# Patient Record
Sex: Male | Born: 1940 | Race: White | Hispanic: No | Smoking: Former smoker
Health system: Southern US, Community
[De-identification: ages and names within clinical notes are randomized; demographics above are authoritative.]

## PROBLEM LIST (undated history)

## (undated) DIAGNOSIS — F0390 Unspecified dementia without behavioral disturbance: Secondary | ICD-10-CM

## (undated) DIAGNOSIS — M109 Gout, unspecified: Secondary | ICD-10-CM

## (undated) DIAGNOSIS — I5032 Chronic diastolic (congestive) heart failure: Secondary | ICD-10-CM

## (undated) HISTORY — DX: Unspecified dementia, unspecified severity, without behavioral disturbance, psychotic disturbance, mood disturbance, and anxiety: F03.90

## (undated) HISTORY — PX: SKIN GRAFT: SHX250

---

## 2015-10-15 ENCOUNTER — Encounter (HOSPITAL_COMMUNITY): Payer: Self-pay | Admitting: Family Medicine

## 2015-10-15 ENCOUNTER — Emergency Department (HOSPITAL_COMMUNITY): Payer: Medicare Other

## 2015-10-15 ENCOUNTER — Emergency Department (HOSPITAL_COMMUNITY)
Admission: EM | Admit: 2015-10-15 | Discharge: 2015-10-15 | Disposition: A | Discharge status: Home / Self Care | Payer: Medicare Other | Attending: Physician Assistant | Admitting: Physician Assistant

## 2015-10-15 DIAGNOSIS — M25562 Pain in left knee: Secondary | ICD-10-CM | POA: Insufficient documentation

## 2015-10-15 DIAGNOSIS — Z87891 Personal history of nicotine dependence: Secondary | ICD-10-CM | POA: Diagnosis not present

## 2015-10-15 DIAGNOSIS — M25561 Pain in right knee: Secondary | ICD-10-CM | POA: Diagnosis not present

## 2015-10-15 DIAGNOSIS — M79672 Pain in left foot: Secondary | ICD-10-CM | POA: Insufficient documentation

## 2015-10-15 DIAGNOSIS — M25569 Pain in unspecified knee: Secondary | ICD-10-CM | POA: Diagnosis not present

## 2015-10-15 HISTORY — DX: Gout, unspecified: M10.9

## 2015-10-15 MED ORDER — MELOXICAM 7.5 MG PO TABS
7.5000 mg | ORAL_TABLET | Freq: Once | ORAL | Status: AC
  Administered 2015-10-15: 7.5 mg via ORAL
  Filled 2015-10-15: qty 1

## 2015-10-15 MED ORDER — HYDROCODONE-ACETAMINOPHEN 5-325 MG PO TABS: 1.0000 | ORAL_TABLET | ORAL | Status: DC | PRN

## 2015-10-15 MED ORDER — HYDROCODONE-ACETAMINOPHEN 5-325 MG PO TABS
1.0000 | ORAL_TABLET | Freq: Once | ORAL | Status: AC
Start: 2015-10-15 — End: 2015-10-15
  Administered 2015-10-15: 1 via ORAL
  Filled 2015-10-15: qty 1

## 2015-10-15 MED ORDER — MELOXICAM 7.5 MG PO TABS: 15.0000 mg | ORAL_TABLET | Freq: Every day | ORAL | Status: DC

## 2015-10-15 MED ORDER — HYDROCODONE-ACETAMINOPHEN 5-325 MG PO TABS: 1.0000 | ORAL_TABLET | Freq: Four times a day (QID) | ORAL | Status: DC | PRN

## 2015-10-15 NOTE — Progress Notes (Signed)
CSW met with patient at bedside. Patient states that he lives at home in [place] with his nephew. He informed CSW that he has only been walking to the bathroom and no where else since Friday.   Patient states that he completes his ADL's independently. He state that he is not interested in a facility but that he will accept information.  CSW provided the patient with facility information and information regarding medical alert bracelet.   CSW asked patient about day programs. However, the patient is not interested. CSW encouraged the patient to apply for medicaid through DSS.  Patient appears to have a good support system that consist of his sister.  Willette Brace [phone] ED CSW 10/15/2015 11:23 PM

## 2015-10-15 NOTE — Discharge Instructions (Signed)
Take the prescribed medication as directed.  Use caution when taking vicodin, it can make you sleepy/drowsy. Recommend to get a walker as recommended by social work. Follow-up with a primary care physician in the area-- see resource guide for this. Return to the ED for new or worsening symptoms.   Emergency Department Resource Guide 1) Find a Doctor and Pay Out of Pocket Although you won't have to find out who is covered by your insurance plan, it is a good idea to ask around and get recommendations. You will then need to call the office and see if the doctor you have chosen will accept you as a new patient and what types of options they offer for patients who are self-pay. Some doctors offer discounts or will set up payment plans for their patients who do not have insurance, but you will need to ask so you aren't surprised when you get to your appointment.  2) Contact Your Local Health Department Not all health departments have doctors that can see patients for sick visits, but many do, so it is worth a call to see if yours does. If you don't know where your local health department is, you can check in your phone book. The CDC also has a tool to help you locate your state's health department, and many state websites also have listings of all of their local health departments.  3) Find a Walk-in Clinic If your illness is not likely to be very severe or complicated, you may want to try a walk in clinic. These are popping up all over the country in pharmacies, drugstores, and shopping centers. They're usually staffed by nurse practitioners or physician assistants that have been trained to treat common illnesses and complaints. They're usually fairly quick and inexpensive. However, if you have serious medical issues or chronic medical problems, these are probably not your best option.  No Primary Care Doctor: - Call Health Connect at  [phone] - they can help you locate a primary care doctor that   accepts your insurance, provides certain services, etc. - Physician Referral Service- [phone]  Chronic Pain Problems: Organization         Address  Phone   Notes  [facility]  [phone]) [phone] Patients need to be referred by their primary care doctor.   Medication Assistance: Organization         Address  Phone   Notes  [facility] [address] [place], [place] [address] [phone] --Must be a resident of [place] [facility] -- Must have NO insurance coverage whatsoever (no Medicaid/ Medicare, etc.) -- The pt. MUST have a primary care doctor that directs their care regularly and follows them in the community   MedAssist  [phone]   [employer]  [phone]    Agencies that provide inexpensive medical care: Organization         Address  Phone   Notes  [facility]  [phone]) [phone]   [facility]    [phone]) [phone]   [facility] [address] [place], [place] [address] [phone]   [facility] [address], [place] [phone]   Planned Parenthood    [phone]) [phone]   [facility]    [phone](336) [phone]   [facility]  [address], [place] Phone:  [phone], Fax:  [phone] Hours of Operation:  9  am - 6 pm, M-F.  Also accepts Medicaid/Medicare and self-pay.  [facility] for [place] [address], [address], [place] Phone: [phone], Fax: [phone]. Hours of Operation:  8:30 am - 5:30 pm, M-F.  Also accepts Medicaid and self-pay.  [facility] [place] [address], [place] Phone: [phone]   [place], [place], [place] [phone], Ext. [phone] Mondays & Thursdays: 7-9 AM.  First 15 patients are seen on a first come, first serve basis.    [place] Providers:  Organization          Address  Phone   Notes  [facility] [address], [place] [phone] Also accepts self-pay patients.  [facility] [address], [place]  [phone]   [place], [address], [place] [phone]   [facility] [address], [place] [phone]   Lucianne Lei [address], [place]   [phone] Only accepts [place] Access [place] patients after they have their name applied to their card.   Self-Pay (no insurance) in [facility]:  Organization         Address  Phone   Notes  Sickle Cell Patients, [facility] [place] [phone]   [facility] [place] [phone]   [facility] [place]  [place], [place], [place] [phone]   [facility]/Dr. Osei-Bonsu  [address], [place] or [address], [place] [phone] Phone number for both [place] and [place] locations is the same.  [facility] [address], [place] [phone]   [facility] [address], [place] or [address] [phone]   [facility] [address], [place] [phone], phone; [phone], fax Sees patients 1st and 3rd Saturday of every month.  Must not qualify for public or private insurance (i.e. Medicaid, Medicare, [place] Health Choice, Veterans' Benefits)  Household income should be no more than 200% of the poverty level The clinic cannot treat you if you are pregnant or think you are pregnant  Sexually transmitted diseases are not treated at the clinic.    Dental Care: Organization         Address  Phone  Notes  [facility] [place] [phone] Accepts children up to age 52 who are enrolled in [place] or [place]; pregnant women with a Medicaid card; and children who have applied for Medicaid or [place] Health Choice, but were declined, whose parents can pay a reduced fee at time of service.  [facility] Department of [facility]  [address], [place] [phone] Accepts children up to age 78 who are enrolled in [place] or [place]; pregnant women with a Medicaid card; and children who have applied for Medicaid or  Health Choice, but were declined, whose parents can pay a reduced fee at time of service.  [facility] Adult [facility] Access PROGRAM  [place] [phone] Patients are seen by appointment only. Walk-ins are not accepted. [place] will see patients 18 years of  age and older. Monday - Tuesday (8am-5pm) Most Wednesdays (8:30-5pm) $30 per visit, cash only  [facility] Adult Dental Access PROGRAM  [address], [facility] [phone] Patients are seen by appointment only. Walk-ins are not accepted. [place] will see patients 57 years of age and older. One Wednesday Evening (Monthly: Volunteer Based).  $30 per visit, cash only  [place]  [phone] for adults; Children under age 78, call Graduate Pediatric Dentistry at [phone]. Children aged 43-14, please call [phone] to request a pediatric application.  Dental services are provided in all areas of dental care including fillings, crowns and bridges, complete and partial dentures, implants, gum treatment, root canals, and extractions. Preventive care is also provided. Treatment is provided to both adults and children. Patients are selected via a lottery and there is often a waiting list.   [facility] [address] [place]  [phone] [email or web address]   [facility] [address] [place], [place]  [phone], Ext. [phone] Second and Fourth Thursday of each month, opens at 6:30 AM; Clinic ends at 9 AM.  Patients are seen on a first-come first-served basis, and a limited number are seen during each clinic.   [facility]  [address] Hillard Danker [place], [place] [phone]   Eligibility Requirements You must have lived in [place], [place], or [place] counties for at least the last three months.   You cannot be eligible for state or federal sponsored [employer], including [employer], [place], or [employer].   You generally cannot be eligible for healthcare insurance through your employer.    How to apply: Eligibility screenings are held every Tuesday and Wednesday afternoon from 1:00 pm until 4:00 pm. You do not need an appointment for the interview!  [facility] [address] [place], [place]   [place]  [place] Department  [place]  [phone]    Behavioral Health Resources in the Community: Intensive Outpatient Programs Organization         Address  Phone  Notes  [place]. [address], [place], [place] [phone]   [facility] Outpatient [address], [place], [place]   ADS: Alcohol & Drug Svcs [address] [place], [place]   [place] [address],  [place], [place] or [phone]   Substance Abuse Resources Organization         Address  Phone  Notes  Alcohol and Drug Services  [phone]   [place]  [phone]   The [place]   Chinita Pester  [phone]   Residential & Outpatient Substance Abuse Program  [phone]   Psychological Services Organization         Address  Phone  Notes  [facility] [place]  [place]  [phone]    [facility] [address] [place] or [phone]    Mobile Crisis Teams Organization         Address  Phone  Notes  [facility], Mobile Crisis Care Unit  [phone]   [facility] Psychotherapeutic Services  [address] [place], [place]   [facility] [address] [place] [phone]    Self-Help/Support Groups Organization  Address  Phone             Notes  [place]. of [place] - variety of support groups  [place] Call for more information  Narcotics Anonymous (NA), Caring [facility] [address], [employer] [place]  2 meetings at this location   Special educational needs teacher         Address  Phone  Notes  [facility]idential Treatment [place],    [place]  [phone]   [facility]  [address] [place] [address]250, [place], [place]   [place], [place] [phone] Admissions: 8am-3pm M-F  Incentives Substance [place] [address],    [place], [place] [phone]   [facility] [address] [place], [place], [place]   [facility] [address]  [place], [place]   [facility]ight Programs - Intensive Outpatient [address], [place], [place]   [facility] ([place].,  [place], [place] [phone] or [phone]   [facility]idential Treatment Services (RTS) [address] [place], [place] Accepts Medicaid  [facility] [place] [address]  [place] [phone] Substance Abuse/Addiction Treatment   [facility] Organization         Address  Phone  Notes  [facility]  [phone]   Domenic Schwab, PhD [address] Arlis [place], [place]   [phone] or [phone]   [place]  [place], [place] [phone]   [facility] [address] [place], [place] [phone] Insurance/Medicaid/sponsorship through [facility] and Families [address] [place]                                    [place], [place] [phone] [place] [address], [place] [phone]    Dr. Adele Schilder  [phone]   [facility] Dept. 1) [address], [place] 2) [place] 3)  [place] [address], [place] [phone]  [phone]   [place] [phone] or [phone] (After Hours)

## 2015-10-15 NOTE — ED Notes (Signed)
Pt ambulating with stand by assistance.

## 2015-10-15 NOTE — ED Notes (Addendum)
Patient is from home [facility]. Patient is complaining of bilateral knee pain with minor swelling knees. Pt reported he was dancing on Friday. Pt denies any injury.

## 2015-10-15 NOTE — ED Provider Notes (Signed)
CSN: [id number]     Arrival date & time 10/15/15  1908 History   First MD Initiated Contact with Patient 10/15/15 2049     Chief Complaint  Patient presents with  . Knee Pain     (Consider location/radiation/quality/duration/timing/severity/associated sxs/prior Treatment) The history is provided by the patient and medical records.    74 y.o. M here with bilateral knee pain after dancing last week.  He states he was doing this for fun, denies injury, trauma, or falls.  Patient states it has been difficulty for him to walk around at home, he lives alone.  Patient also states some left foot pain.  He states he thinks he has a hx of gout, however sister reports this has never been formally diagnosed.  He states he thinks he took some "gout medicine" but not sure exactly what it was.  He also reports some left foot pain.  Patient states he has had trouble walking at home over the past few days due to pain so he has been urinating in a jug in his room.  He denies fever, chills, sweats.  No chest pain or SOB.  VSS.  Past Medical History  Diagnosis Date  . Gout    History reviewed. No pertinent past surgical history. History reviewed. No pertinent family history. Social History  Substance Use Topics  . Smoking status: Former Research scientist (life sciences)  . Smokeless tobacco: None  . Alcohol Use: No    Review of Systems  Musculoskeletal: Positive for arthralgias.  All other systems reviewed and are negative.     Allergies  Review of patient's allergies indicates no known allergies.  Home Medications   Prior to Admission medications   Not on File   BP 127/80 mmHg  Pulse 109  Temp(Src) 98.2 F (36.8 C) (Oral)  Resp 20  Ht _0  (1.753 m)  Wt 83.915 kg  BMI 27.31 kg/m2  SpO2 99%   Physical Exam  Constitutional: He is oriented to person, place, and time. He appears well-developed and well-nourished. No distress.  elderly  HENT:  Head: Normocephalic and atraumatic.  Mouth/Throat: Oropharynx is  clear and moist.  Eyes: Conjunctivae and EOM are normal. Pupils are equal, round, and reactive to light.  Neck: Normal range of motion. Neck supple.  Cardiovascular: Normal rate, regular rhythm and normal heart sounds.   Pulmonary/Chest: Effort normal and breath sounds normal. No respiratory distress. He has no wheezes.  Musculoskeletal: Normal range of motion.       Right knee: He exhibits no swelling. Tenderness found. Medial joint line tenderness noted.       Left knee: He exhibits no swelling. Tenderness found. Medial joint line tenderness noted.       Left foot: There is tenderness.  Tenderness of medial joint lines of bilateral knees as well as diffusely throughout left foot; no bony deformities; mild soft tissue swelling noted; no skin discoloration or open wounds; extremities are neurovascularly intact  Neurological: He is alert and oriented to person, place, and time.  Skin: Skin is warm and dry. He is not diaphoretic.  Psychiatric: He has a normal mood and affect.  Nursing note and vitals reviewed.   ED Course  Procedures (including critical care time) Labs Review Labs Reviewed - No data to display  Imaging Review Dg Knee Complete 4 Views Left  10/15/2015  CLINICAL DATA:  Left knee pain following consecutive days of dancing, initial encounter EXAM: LEFT KNEE - COMPLETE 4+ VIEW COMPARISON:  None FINDINGS: No acute fracture  or dislocation is noted. A large joint effusion is noted in the suprapatellar bursa. Some prepatellar skin thickening is noted as well. IMPRESSION: Soft tissue changes as described without acute bony abnormality. Electronically Signed   By: Inez Catalina M.D.   On: 10/15/2015 22:52   Dg Knee Complete 4 Views Right  10/15/2015  CLINICAL DATA:  Knee pain following dancing for 3 consecutive days, initial encounter EXAM: RIGHT KNEE - COMPLETE 4+ VIEW COMPARISON:  None. FINDINGS: No acute fracture or dislocation is noted. A small joint effusion is noted as well as  some thickening over the prepatellar region. Mild vascular calcifications are noted. IMPRESSION: Soft tissue changes without acute bony abnormality. Electronically Signed   By: Inez Catalina M.D.   On: 10/15/2015 22:49   Dg Foot Complete Left  10/15/2015  CLINICAL DATA:  74 year old male with gout presenting with foot pain. EXAM: LEFT FOOT - COMPLETE 3+ VIEW COMPARISON:  None. FINDINGS: No acute fracture or dislocation. There are erosive changes of the medial aspect of the first MTP joint compatible with known gout. The soft tissues are unremarkable. Spleen IMPRESSION: No acute fracture dislocation. Electronically Signed   By: Anner Crete M.D.   On: 10/15/2015 22:55   I have personally reviewed and evaluated these images and lab results as part of my medical decision-making.   EKG Interpretation None      MDM   Final diagnoses:  Bilateral knee pain  Left foot pain   74 year old male here with bilateral knee pain as well as left foot pain after dancing last week.  He has had limited ambulation since due to pain.  Patient does currently live alone, sister encouraged him to come here today. He has tenderness of medial joint lines of bilateral knees as well as diffusely throughout left foot. There are no overlying skin changes or signs concerning for septic joint. Initially, patient had difficulty ambulating, however he was able to ambulate with standby assistance after his pain was controlled with Mobic and Vicodin. Sister has concerns as patient lives alone.  Social work has met with patient and offered him multiple home health services and/or day programs but he declined all assistance. He does have a cane at home, sister is planning to get him a walker as well. He does not currently have a primary care physician, he was given resource guide in which to find one in the area. He was also encouraged to sign up for Medicaid.  Discussed plan with patient, he/she acknowledged understanding and  agreed with plan of care.  Return precautions given for new or worsening symptoms.  Larene Pickett, PA-C 10/16/15 0009  Courteney Julio Alm, MD 10/16/15 [phone]

## 2015-10-16 NOTE — Progress Notes (Signed)
[facility] by EDRN to speak to patient.  Patient presents to Ed with pan to left foot.  EDCM spoke to patient and his sister at bedside.  Patient lives at home with his nephew.  Patient's sister reports, "He does help out a little."  Patient reports he has never had home health before.  EDCM explained home health services to patient and his sister.  Patient eports he is able to complete his ADL's on his own.  Patient declines home health services at this time.  [facility] asked patient if he needed a walker?  Patient reports he has a cane at home.  He also reports he has his wife's walker at home.  Patient's sister is nit sure of this.  Patient declined walker at this time.  EDCM informed patient and his sister that they may purchase a walker at a good will store for discount price.  Patient's sister reports there are two good will stores around where she lives and she will check there for a walker.  EDCM provided patient with list of home health agencies [place].  EDCM explained to patient and his sister if he chooses to have home health services in the future, he may do so through his pcp.  Patient reports his pcp is Dr. Vear ClockPhillips in [place].  Patient and patient's sister thankful for resources.  No further EDCM needs at this time.

## 2015-12-04 ENCOUNTER — Encounter (HOSPITAL_COMMUNITY): Payer: Self-pay | Admitting: Emergency Medicine

## 2015-12-04 ENCOUNTER — Emergency Department (HOSPITAL_COMMUNITY)
Admission: EM | Admit: 2015-12-04 | Discharge: 2015-12-04 | Disposition: A | Discharge status: Home / Self Care | Payer: Medicare Other | Attending: Emergency Medicine | Admitting: Emergency Medicine

## 2015-12-04 ENCOUNTER — Emergency Department (HOSPITAL_COMMUNITY): Payer: Medicare Other

## 2015-12-04 DIAGNOSIS — Y9389 Activity, other specified: Secondary | ICD-10-CM | POA: Insufficient documentation

## 2015-12-04 DIAGNOSIS — Z791 Long term (current) use of non-steroidal anti-inflammatories (NSAID): Secondary | ICD-10-CM | POA: Diagnosis not present

## 2015-12-04 DIAGNOSIS — M25562 Pain in left knee: Secondary | ICD-10-CM | POA: Diagnosis not present

## 2015-12-04 DIAGNOSIS — Z87891 Personal history of nicotine dependence: Secondary | ICD-10-CM | POA: Diagnosis not present

## 2015-12-04 DIAGNOSIS — I10 Essential (primary) hypertension: Secondary | ICD-10-CM | POA: Insufficient documentation

## 2015-12-04 DIAGNOSIS — Y92410 Unspecified street and highway as the place of occurrence of the external cause: Secondary | ICD-10-CM | POA: Insufficient documentation

## 2015-12-04 DIAGNOSIS — Y998 Other external cause status: Secondary | ICD-10-CM | POA: Insufficient documentation

## 2015-12-04 DIAGNOSIS — Z8739 Personal history of other diseases of the musculoskeletal system and connective tissue: Secondary | ICD-10-CM | POA: Diagnosis not present

## 2015-12-04 DIAGNOSIS — S8992XA Unspecified injury of left lower leg, initial encounter: Secondary | ICD-10-CM | POA: Insufficient documentation

## 2015-12-04 MED ORDER — HYDROCODONE-ACETAMINOPHEN 5-325 MG PO TABS: 1.0000 | ORAL_TABLET | Freq: Four times a day (QID) | ORAL | Status: DC | PRN

## 2015-12-04 NOTE — Progress Notes (Signed)
ED CM consulted by Triage NP for assistance with orthopedic referral providers, Stating pt without insurance and no pcp CM spoke with pt and daughter at bedside prior to review of EPIC notes on pt because the male at the bedside was anxious to be seen  Cm provided 75 yr old male with medicare.gov contact information & toll free number 1-800-MEDICARE (1-709-875-5507) The male at the bedside states pt just never got time to apply for medicare.  Cm encouraged pt to apply for medicare and discussed uninsured discounted pcps in [place] Discussed EDPs are not pcp and some specialists do not take referral from EDP only pcps.  Discussed that specialists may not offered discounted fees to be seen Discussed $200-250 fees  Discussed medicare enrollment time frames and possible penalty for late enrollment  Provided DSS & SSI contact information  CM discussed and provided written information for uninsured accepting pcps, discussed the importance of pcp vs EDP services for f/u care, www.needymeds.org, www.goodrx.com, discounted pharmacies and other [employer] such as [facility] , [facility], affordable care act, financial assistance, uninsured dental services, [place] med assist, DSS and  health department  Reviewed resources for [employer] uninsured accepting pcps like [facility], family medicine at [address] [facility], [facility], [facility], local urgent care centers, [facility], [facility] family practice, general medical clinics, family services of the [place], [facility] urgent care plus others, medication resources, [facility] out patient pharmacies and housing Pt voiced understanding and appreciation of resources provided   Provided [facility] contact information Male at bedside states she wanted to get pt medicare and not use [facility] services  Male at bedside took resources from [employer] and informed Cm "he won't remember what you said as soon as you walk out the door"  But pt had told Cm he  had been seeing a dr in [place]   ED PA Updated on resources provided and recommendation to get medicare and to get the dr he has seen before to offer referral for orthopedic provider  When Cm began documenting on the pt Cm noted pt confirmed by ED registration to have medicare coverage   Plus pt has already been seen by [facility] ED PM CM on 10/15/15 when he hurt his foot, was offered home health but refused, told how to get a walker, cane from local [employer] Pt confirmed with this CM he has a cane and has been seen by Dr in [place] Male at bedside in constant motion and present to be nervous Male states pt see dr in [place] for his blood pressure when Cm asked why didn't the pt ask this pcp for a referral to an orthopedic provider, male stated "we never thought about it"  Cm referred pt and male to the dr pt sees for his bp Cm later noted when pt seen on 10/15/15 he states the doctor was Dr Vear Clock in [place]

## 2015-12-04 NOTE — Discharge Instructions (Signed)
Knee Pain Knee pain is a very common symptom and can have many causes. Knee pain often goes away when you follow your health care provider's instructions for relieving pain and discomfort at home. However, knee pain can develop into a condition that needs treatment. Some conditions may include:  Arthritis caused by wear and tear (osteoarthritis).  Arthritis caused by swelling and irritation (rheumatoid arthritis or gout).  A cyst or growth in your knee.  An infection in your knee joint.  An injury that will not heal.  Damage, swelling, or irritation of the tissues that support your knee (torn ligaments or tendinitis). If your knee pain continues, additional tests may be ordered to diagnose your condition. Tests may include X-rays or other imaging studies of your knee. You may also need to have fluid removed from your knee. Treatment for ongoing knee pain depends on the cause, but treatment may include:  Medicines to relieve pain or swelling.  Steroid injections in your knee.  Physical therapy.  Surgery. HOME CARE INSTRUCTIONS  Take medicines only as directed by your health care provider.  Rest your knee and keep it raised (elevated) while you are resting.  Do not do things that cause or worsen pain.  Avoid high-impact activities or exercises, such as running, jumping rope, or doing jumping jacks.  Apply ice to the knee area:  Put ice in a plastic bag.  Place a towel between your skin and the bag.  Leave the ice on for 20 minutes, 2-3 times a day.  Ask your health care provider if you should wear an elastic knee support.  Keep a pillow under your knee when you sleep.  Lose weight if you are overweight. Extra weight can put pressure on your knee.  Do not use any tobacco products, including cigarettes, chewing tobacco, or electronic cigarettes. If you need help quitting, ask your health care provider. Smoking may slow the healing of any bone and joint problems that you may  have. SEEK MEDICAL CARE IF:  Your knee pain continues, changes, or gets worse.  You have a fever along with knee pain.  Your knee buckles or locks up.  Your knee becomes more swollen. SEEK IMMEDIATE MEDICAL CARE IF:   Your knee joint feels hot to the touch.  You have chest pain or trouble breathing.   This information is not intended to replace advice given to you by your health care provider. Make sure you discuss any questions you have with your health care provider.   Document Released: 08/02/2007 Document Revised: 10/26/2014 Document Reviewed: 05/21/2014 Elsevier Interactive Patient Education 2016 Elsevier Inc.  Managing Your High Blood Pressure Blood pressure is a measurement of how forceful your blood is pressing against the walls of the arteries. Arteries are muscular tubes within the circulatory system. Blood pressure does not stay the same. Blood pressure rises when you are active, excited, or nervous; and it lowers during sleep and relaxation. If the numbers measuring your blood pressure stay above normal most of the time, you are at risk for health problems. High blood pressure (hypertension) is a long-term (chronic) condition in which blood pressure is elevated. A blood pressure reading is recorded as two numbers, such as 120 over 80 (or 120/80). The first, higher number is called the systolic pressure. It is a measure of the pressure in your arteries as the heart beats. The second, lower number is called the diastolic pressure. It is a measure of the pressure in your arteries as the heart  relaxes between beats.  Keeping your blood pressure in a normal range is important to your overall health and prevention of health problems, such as heart disease and stroke. When your blood pressure is uncontrolled, your heart has to work harder than normal. High blood pressure is a very common condition in adults because blood pressure tends to rise with age. Men and women are equally likely  to have hypertension but at different times in life. Before age 74, men are more likely to have hypertension. After 76 years of age, women are more likely to have it. Hypertension is especially common in African Americans. This condition often has no signs or symptoms. The cause of the condition is usually not known. Your caregiver can help you come up with a plan to keep your blood pressure in a normal, healthy range. BLOOD PRESSURE STAGES Blood pressure is classified into four stages: normal, prehypertension, stage 1, and stage 2. Your blood pressure reading will be used to determine what type of treatment, if any, is necessary. Appropriate treatment options are tied to these four stages:  Normal  Systolic pressure (mm Hg): below 120.  Diastolic pressure (mm Hg): below 80. Prehypertension  Systolic pressure (mm Hg): 120 to 139.  Diastolic pressure (mm Hg): 80 to 89. Stage1  Systolic pressure (mm Hg): 140 to 159.  Diastolic pressure (mm Hg): 90 to 99. Stage2  Systolic pressure (mm Hg): 160 or above.  Diastolic pressure (mm Hg): 100 or above. RISKS RELATED TO HIGH BLOOD PRESSURE Managing your blood pressure is an important responsibility. Uncontrolled high blood pressure can lead to:  A heart attack.  A stroke.  A weakened blood vessel (aneurysm).  Heart failure.  Kidney damage.  Eye damage.  Metabolic syndrome.  Memory and concentration problems. HOW TO MANAGE YOUR BLOOD PRESSURE Blood pressure can be managed effectively with lifestyle changes and medicines (if needed). Your caregiver will help you come up with a plan to bring your blood pressure within a normal range. Your plan should include the following: Education  Read all information provided by your caregivers about how to control blood pressure.  Educate yourself on the latest guidelines and treatment recommendations. New research is always being done to further define the risks and treatments for high blood  pressure. Lifestylechanges  Control your weight.  Avoid smoking.  Stay physically active.  Reduce the amount of salt in your diet.  Reduce stress.  Control any chronic conditions, such as high cholesterol or diabetes.  Reduce your alcohol intake. Medicines  Several medicines (antihypertensive medicines) are available, if needed, to bring blood pressure within a normal range. Communication  Review all the medicines you take with your caregiver because there may be side effects or interactions.  Talk with your caregiver about your diet, exercise habits, and other lifestyle factors that may be contributing to high blood pressure.  See your caregiver regularly. Your caregiver can help you create and adjust your plan for managing high blood pressure. RECOMMENDATIONS FOR TREATMENT AND FOLLOW-UP  The following recommendations are based on current guidelines for managing high blood pressure in nonpregnant adults. Use these recommendations to identify the proper follow-up period or treatment option based on your blood pressure reading. You can discuss these options with your caregiver.  Systolic pressure of 120 to 139 or diastolic pressure of 80 to 89: Follow up with your caregiver as directed.  Systolic pressure of 140 to 160 or diastolic pressure of 90 to 100: Follow up with your caregiver within 2  months.  Systolic pressure above 160 or diastolic pressure above 100: Follow up with your caregiver within 1 month.  Systolic pressure above 180 or diastolic pressure above 110: Consider antihypertensive therapy; follow up with your caregiver within 1 week.  Systolic pressure above 200 or diastolic pressure above 120: Begin antihypertensive therapy; follow up with your caregiver within 1 week.   This information is not intended to replace advice given to you by your health care provider. Make sure you discuss any questions you have with your health care provider.   Document Released:  06/29/2012 Document Reviewed: 06/29/2012 Elsevier Interactive Patient Education 2016 Elsevier Inc.  DASH Eating Plan DASH stands for "Dietary Approaches to Stop Hypertension." The DASH eating plan is a healthy eating plan that has been shown to reduce high blood pressure (hypertension). Additional health benefits may include reducing the risk of type 2 diabetes mellitus, heart disease, and stroke. The DASH eating plan may also help with weight loss. WHAT DO I NEED TO KNOW ABOUT THE DASH EATING PLAN? For the DASH eating plan, you will follow these general guidelines:  Choose foods with a percent daily value for sodium of less than 5% (as listed on the food label).  Use salt-free seasonings or herbs instead of table salt or sea salt.  Check with your health care provider or pharmacist before using salt substitutes.  Eat lower-sodium products, often labeled as "lower sodium" or "no salt added."  Eat fresh foods.  Eat more vegetables, fruits, and low-fat dairy products.  Choose whole grains. Look for the word "whole" as the first word in the ingredient list.  Choose fish and skinless chicken or [place] more often than red meat. Limit fish, poultry, and meat to 6 oz (170 g) each day.  Limit sweets, desserts, sugars, and sugary drinks.  Choose heart-healthy fats.  Limit cheese to 1 oz (28 g) per day.  Eat more home-cooked food and less restaurant, buffet, and fast food.  Limit fried foods.  Cook foods using methods other than frying.  Limit canned vegetables. If you do use them, rinse them well to decrease the sodium.  When eating at a restaurant, ask that your food be prepared with less salt, or no salt if possible. WHAT FOODS CAN I EAT? Seek help from a dietitian for individual calorie needs. Grains Whole grain or whole wheat bread. Brown rice. Whole grain or whole wheat pasta. Quinoa, bulgur, and whole grain cereals. Low-sodium cereals. Corn or whole wheat flour tortillas.  Whole grain cornbread. Whole grain crackers. Low-sodium crackers. Vegetables Fresh or frozen vegetables (raw, steamed, roasted, or grilled). Low-sodium or reduced-sodium tomato and vegetable juices. Low-sodium or reduced-sodium tomato sauce and paste. Low-sodium or reduced-sodium canned vegetables.  Fruits All fresh, canned (in natural juice), or frozen fruits. Meat and Other Protein Products Ground beef (85% or leaner), grass-fed beef, or beef trimmed of fat. Skinless chicken or [place]. Ground chicken or [place]. Pork trimmed of fat. All fish and seafood. Eggs. Dried beans, peas, or lentils. Unsalted nuts and seeds. Unsalted canned beans. Dairy Low-fat dairy products, such as skim or 1% milk, 2% or reduced-fat cheeses, low-fat ricotta or cottage cheese, or plain low-fat yogurt. Low-sodium or reduced-sodium cheeses. Fats and Oils Tub margarines without trans fats. Light or reduced-fat mayonnaise and salad dressings (reduced sodium). Avocado. Safflower, olive, or canola oils. Natural peanut or almond butter. Other Unsalted popcorn and pretzels. The items listed above may not be a complete list of recommended foods or beverages. Contact your dietitian  for more options. WHAT FOODS ARE NOT RECOMMENDED? Grains White bread. White pasta. White rice. Refined cornbread. Bagels and croissants. Crackers that contain trans fat. Vegetables Creamed or fried vegetables. Vegetables in a cheese sauce. Regular canned vegetables. Regular canned tomato sauce and paste. Regular tomato and vegetable juices. Fruits Dried fruits. Canned fruit in light or heavy syrup. Fruit juice. Meat and Other Protein Products Fatty cuts of meat. Ribs, chicken wings, bacon, sausage, bologna, salami, chitterlings, fatback, hot dogs, bratwurst, and packaged luncheon meats. Salted nuts and seeds. Canned beans with salt. Dairy Whole or 2% milk, cream, half-and-half, and cream cheese. Whole-fat or sweetened yogurt. Full-fat cheeses or  blue cheese. Nondairy creamers and whipped toppings. Processed cheese, cheese spreads, or cheese curds. Condiments Onion and garlic salt, seasoned salt, table salt, and sea salt. Canned and packaged gravies. Worcestershire sauce. Tartar sauce. Barbecue sauce. Teriyaki sauce. Soy sauce, including reduced sodium. Steak sauce. Fish sauce. Oyster sauce. Cocktail sauce. Horseradish. Ketchup and mustard. Meat flavorings and tenderizers. Bouillon cubes. Hot sauce. Tabasco sauce. Marinades. Taco seasonings. Relishes. Fats and Oils Butter, stick margarine, lard, shortening, ghee, and bacon fat. Coconut, palm kernel, or palm oils. Regular salad dressings. Other Pickles and olives. Salted popcorn and pretzels. The items listed above may not be a complete list of foods and beverages to avoid. Contact your dietitian for more information. WHERE CAN I FIND MORE INFORMATION? National Heart, Lung, and Blood Institute: [employer]   This information is not intended to replace advice given to you by your health care provider. Make sure you discuss any questions you have with your health care provider.   Document Released: 09/24/2011 Document Revised: 10/26/2014 Document Reviewed: 08/09/2013 Elsevier Interactive Patient Education [employer]
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
## 2016-02-02 ENCOUNTER — Emergency Department (HOSPITAL_COMMUNITY): Payer: Medicare Other

## 2016-02-02 ENCOUNTER — Observation Stay (HOSPITAL_COMMUNITY)
Admission: EM | Admit: 2016-02-02 | Discharge: 2016-02-04 | Disposition: A | Discharge status: Home / Self Care | Payer: Medicare Other | Attending: Internal Medicine | Admitting: Internal Medicine

## 2016-02-02 ENCOUNTER — Inpatient Hospital Stay (HOSPITAL_COMMUNITY): Payer: Medicare Other

## 2016-02-02 ENCOUNTER — Encounter (HOSPITAL_COMMUNITY): Payer: Self-pay | Admitting: Emergency Medicine

## 2016-02-02 DIAGNOSIS — R739 Hyperglycemia, unspecified: Secondary | ICD-10-CM | POA: Insufficient documentation

## 2016-02-02 DIAGNOSIS — R262 Difficulty in walking, not elsewhere classified: Secondary | ICD-10-CM | POA: Diagnosis not present

## 2016-02-02 DIAGNOSIS — I951 Orthostatic hypotension: Secondary | ICD-10-CM | POA: Diagnosis not present

## 2016-02-02 DIAGNOSIS — D72829 Elevated white blood cell count, unspecified: Secondary | ICD-10-CM | POA: Insufficient documentation

## 2016-02-02 DIAGNOSIS — N19 Unspecified kidney failure: Secondary | ICD-10-CM | POA: Insufficient documentation

## 2016-02-02 DIAGNOSIS — F0390 Unspecified dementia without behavioral disturbance: Secondary | ICD-10-CM | POA: Insufficient documentation

## 2016-02-02 DIAGNOSIS — Z23 Encounter for immunization: Secondary | ICD-10-CM | POA: Insufficient documentation

## 2016-02-02 DIAGNOSIS — R059 Cough, unspecified: Secondary | ICD-10-CM

## 2016-02-02 DIAGNOSIS — R05 Cough: Secondary | ICD-10-CM | POA: Insufficient documentation

## 2016-02-02 DIAGNOSIS — R404 Transient alteration of awareness: Secondary | ICD-10-CM | POA: Diagnosis not present

## 2016-02-02 DIAGNOSIS — R531 Weakness: Secondary | ICD-10-CM | POA: Diagnosis not present

## 2016-02-02 DIAGNOSIS — R55 Syncope and collapse: Secondary | ICD-10-CM | POA: Diagnosis not present

## 2016-02-02 DIAGNOSIS — Z87891 Personal history of nicotine dependence: Secondary | ICD-10-CM | POA: Diagnosis not present

## 2016-02-02 DIAGNOSIS — M109 Gout, unspecified: Secondary | ICD-10-CM | POA: Diagnosis not present

## 2016-02-02 LAB — CBC
HCT: 38.3 % — ABNORMAL LOW (ref 39.0–52.0)
HCT: 37.9 % — ABNORMAL LOW (ref 39.0–52.0)
HEMOGLOBIN: 12.9 g/dL — AB (ref 13.0–17.0)
HEMOGLOBIN: 12.9 g/dL — AB (ref 13.0–17.0)
MCH: 32.3 pg (ref 26.0–34.0)
MCH: 32.4 pg (ref 26.0–34.0)
MCHC: 33.7 g/dL (ref 30.0–36.0)
MCHC: 34 g/dL (ref 30.0–36.0)
MCV: 95.8 fL (ref 78.0–100.0)
MCV: 95.2 fL (ref 78.0–100.0)
PLATELETS: 188 10*3/uL (ref 150–400)
Platelets: 218 10*3/uL (ref 150–400)
RBC: 4 MIL/uL — AB (ref 4.22–5.81)
RBC: 3.98 MIL/uL — ABNORMAL LOW (ref 4.22–5.81)
RDW: 13.7 % (ref 11.5–15.5)
RDW: 13.7 % (ref 11.5–15.5)
WBC: 18.6 10*3/uL — ABNORMAL HIGH (ref 4.0–10.5)
WBC: 20.5 10*3/uL — AB (ref 4.0–10.5)

## 2016-02-02 LAB — DIFFERENTIAL
BASOS ABS: 0 10*3/uL (ref 0.0–0.1)
BASOS PCT: 0 %
Eosinophils Absolute: 0.1 10*3/uL (ref 0.0–0.7)
Eosinophils Relative: 0 %
LYMPHS PCT: 12 %
Lymphs Abs: 2.5 10*3/uL (ref 0.7–4.0)
MONOS PCT: 7 %
Monocytes Absolute: 1.4 10*3/uL — ABNORMAL HIGH (ref 0.1–1.0)
NEUTROS PCT: 81 %
Neutro Abs: 16.5 10*3/uL — ABNORMAL HIGH (ref 1.7–7.7)

## 2016-02-02 LAB — BASIC METABOLIC PANEL
Anion gap: 9 (ref 5–15)
BUN: 23 mg/dL — ABNORMAL HIGH (ref 6–20)
CALCIUM: 8.3 mg/dL — AB (ref 8.9–10.3)
CO2: 24 mmol/L (ref 22–32)
Chloride: 108 mmol/L (ref 101–111)
Creatinine, Ser: 1.26 mg/dL — ABNORMAL HIGH (ref 0.61–1.24)
GFR calc Af Amer: >60 mL/min (ref >60)
GFR, EST NON AFRICAN AMERICAN: 54 mL/min — AB (ref >60)
Glucose, Bld: 131 mg/dL — ABNORMAL HIGH (ref 65–99)
Potassium: 3.9 mmol/L (ref 3.5–5.1)
Sodium: 141 mmol/L (ref 135–145)

## 2016-02-02 LAB — URINALYSIS, ROUTINE W REFLEX MICROSCOPIC
Glucose, UA: NEGATIVE mg/dL
Hgb urine dipstick: NEGATIVE
Ketones, ur: 15 mg/dL — AB
Leukocytes, UA: NEGATIVE
NITRITE: NEGATIVE
PROTEIN: 30 mg/dL — AB
SPECIFIC GRAVITY, URINE: 1.039 — AB (ref 1.005–1.030)
pH: 5.5 (ref 5.0–8.0)

## 2016-02-02 LAB — URINE MICROSCOPIC-ADD ON

## 2016-02-02 LAB — I-STAT TROPONIN, ED: TROPONIN I, POC: 0 ng/mL (ref 0.00–0.08)

## 2016-02-02 LAB — D-DIMER, QUANTITATIVE: D-Dimer, Quant: 0.54 ug/mL-FEU — ABNORMAL HIGH (ref 0.00–0.50)

## 2016-02-02 MED ORDER — DEXTROMETHORPHAN POLISTIREX ER 30 MG/5ML PO SUER
15.0000 mg | Freq: Two times a day (BID) | ORAL | Status: DC | PRN
  Administered 2016-02-02 – 2016-02-03 (×2): 15 mg via ORAL
  Filled 2016-02-02 (×4): qty 5

## 2016-02-02 MED ORDER — ENOXAPARIN SODIUM 40 MG/0.4ML ~~LOC~~ SOLN
40.0000 mg | SUBCUTANEOUS | Status: DC
  Administered 2016-02-02 – 2016-02-03 (×2): 40 mg via SUBCUTANEOUS
  Filled 2016-02-02 (×2): qty 0.4

## 2016-02-02 MED ORDER — SODIUM CHLORIDE 0.9% FLUSH
3.0000 mL | Freq: Two times a day (BID) | INTRAVENOUS | Status: DC
  Administered 2016-02-02 – 2016-02-04 (×4): 3 mL via INTRAVENOUS

## 2016-02-02 MED ORDER — IOPAMIDOL (ISOVUE-370) INJECTION 76%
INTRAVENOUS | Status: AC
  Administered 2016-02-02: 70 mL
  Filled 2016-02-02: qty 100

## 2016-02-02 MED ORDER — PNEUMOCOCCAL VAC POLYVALENT 25 MCG/0.5ML IJ INJ: 0.5000 mL | INJECTION | INTRAMUSCULAR | Status: DC

## 2016-02-02 MED ORDER — SODIUM CHLORIDE 0.9 % IV SOLN
INTRAVENOUS | Status: DC
  Administered 2016-02-02: 23:00:00 via INTRAVENOUS

## 2016-02-02 NOTE — Progress Notes (Signed)
Pt D-dimer 0.54 Temp 100.4  Orthostatic VS Lying  134/76, HR 89 Sitting  135/63, HR 94 Standing  112/68, HR 108 Standing after 3 minutes  125/66, HR 107   MD on call paged to notify.

## 2016-02-02 NOTE — H&P (Signed)
Date: 02/02/2016               Patient Name:  Jacob Barajas MRN: [id number]  DOB: 1941/09/06 Age / Sex: 75 y.o., male   PCP: Marcine Matarharles W Phillips Jr., MD              Medical Service: Internal Medicine Teaching Service              Attending Physician: Dr. Burns SpainElizabeth A Butcher, MD    First Contact: Smitty KnudsenSarah Taino Maertens, MS 4 Pager: [phone]  Second Contact: Dr. Beckie Saltsivet Pager: [phone]            After Hours (After 5p/  First Contact Pager: [phone]  weekends / holidays): Second Contact Pager: [phone]   Chief Complaint: fell out at church  History of Present Illness: [name] is a 75 y.o. Man with a history of gout, who presents to the ED after an episode of syncope at church this morning.  He is accommpained by his friend/adopted daughter, with whom he has been living for the past 2 months, and who provides some of the history. According to the daughter, [name] has been slowly "going downhill lately" including having a cough that occurs only at night and is sometimes productive of small amounts of white mucous for the past "couple of months", but otherwise had been in his normal state of health prior to leaving for church this morning.  He had been complaining of being sleepy, but this was attributed to some OTC cough syrup taken the previous night that had alcohol in it that made him very sleepy.  The daughter could not recall what other medication contained in the cough syrup, but just that it made [name] very sleepy.  He also took some this morning before heading to church.  [name] does not remember much about falling out at church, other than just feeling very sleepy, like he "just fell asleep" and then he "woke up and he was on the ground".  He nor his daughter could say how long he was out before awakening.  He denies any lightheadedness, aura, vision/auditory changes, palpitations, or weakness that he experienced before he fell out.  He said he just woke up and realized he had fallen  asleep.  He denies that anyone saw him convulse while he was down.  He denies urinary or fecal incontinence at the time of the event.  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  He denies hypnogogic or hypnopompic hallucinations or paralysis and denies dreaming while he is asleep.  He denies chest pain, shortness of breath, weight loss, headaches, weakness, numbness/tingling, trouble swallowing, hematochezia, hematuria, hemoptysis, fevers/chills, or changes in bowel/bladder habits.  He reports that he can walk to and from the mailbox and up and down stairs without shortness of breath or chest pain.    In the ED a CXR was significant for bibasilar atelectasis vs. Infiltrates. An ECG was significant for partial LBBB and normal sinus rhythm.  A UA showed high specific gravity, and increased protein.     Meds: No current facility-administered medications for this encounter.   No current outpatient prescriptions on file.    Allergies: Allergies as of 02/02/2016  . (No Known Allergies)   Past Medical History  Diagnosis Date  . Gout    History reviewed. No pertinent past surgical history. Family History  Problem Relation  Age of Onset  . Heart disease Brother   . Cancer Sister     "throat" cancer   Social History   Social History  . Marital Status: Widowed    Spouse Name: N/A  . Number of Children: N/A  . Years of Education: N/A   Occupational History  . Not on file.   Social History Main Topics  . Smoking status: Former Games developer  . Smokeless tobacco: Not on file  . Alcohol Use: No  . Drug Use: Not on file  . Sexual Activity: No   Other Topics Concern  . Not on file   Social History Narrative    Review of Systems: Pertinent items noted in HPI and remainder of comprehensive ROS otherwise negative.  Physical Exam: Blood pressure 141/78, pulse 78, temperature 100.7 F (38.2 C),  temperature source Rectal, resp. rate 23, height  (1.676 m), weight 85.276 kg (188 lb), SpO2 100 %. General appearance: alert, cooperative and slowed mentation Head: Normocephalic, without obvious abnormality, atraumatic Eyes: conjunctivae/corneas clear. PERRL, EOM's intact. Fundi benign. Throat: lips, mucosa, and tongue normal; teeth and gums normal Neck: no adenopathy, no JVD, supple, symmetrical, trachea midline and thyroid not enlarged, symmetric, no tenderness/mass/nodules Lungs: faint inspiratory crackles in basilar lungs bilaterally Heart: regular rate and rhythm and S1, S2 normal Abdomen: soft, non-tender; bowel sounds normal; no masses,  no organomegaly Extremities: extremities normal, atraumatic, no cyanosis or edema Pulses: 2+ and symmetric Neurologic: Mental status: alertness: alert, orientation: time, date, person, place, president, can recall only 1/3 items Cranial nerves: normal  Lab results: Basic Metabolic Panel:  Recent Labs  46/96/29 1354  NA 141  K 3.9  CL 108  CO2 24  GLUCOSE 131*  BUN 23*  CREATININE 1.26*  CALCIUM 8.3*   CBC:  Recent Labs  02/02/16 1354  WBC 18.6*  HGB 12.9*  HCT 38.3*  MCV 95.8  PLT 188     Imaging results:  Dg Chest Portable 1 View  02/02/2016  CLINICAL DATA:  75 year old male with history of syncope while it church this morning. EXAM: PORTABLE CHEST 1 VIEW COMPARISON:  Chest x-ray 03/08/2011. FINDINGS: Lung volumes are low. Ill-defined bibasilar opacities are presumably areas of atelectasis. No consolidative airspace disease. No pleural effusions. No pneumothorax. No pulmonary nodule or mass noted. Pulmonary vasculature and the cardiomediastinal silhouette are within normal limits. IMPRESSION: 1. Low lung volumes with probable bibasilar subsegmental atelectasis. Electronically Signed   By: Trudie Reed M.D.   On: 02/02/2016 15:15    Other results: EKG: Sinus rhythm Incomplete left bundle branch block Probable left  ventricular hypertrophy Anterior Q waves, possibly due to LVH  Assessment & Plan by Problem: Active Problems:   Syncope  Mr. Kniskern is a 75 y.o. Man with no known significant past medical history who presented to the ED with 1 episode of syncope, likely cognitive impairment, concentrated urine, low-grade fever, and leukocytosis in the setting of prior use of OTC cough syrup suggesting orthostasis secondary to infection or dehydration vs. medication side effect.    Syncope: Patient with witnessed syncopal episode after taking medication that likely contained alcohol and diphenhydramine.  Laboratory and imaging findings are significant leukocytosis, low-grade fever, and some faint atelectasis/infilatrates on CXR.  He also had concentrated urine on UA.  The differential for syncope in an elderly man is broad, and given the history and physical exam and laboratory findings in this patient the most likely causes are medication side effect, orthostasis due to decreased intravascular volume as  evidenced by increased creatinine and high urine specific gravity, or early infection.  Can't miss findings include arrhythmia or other cardiac pathology.  While is ECG did show partial LBBB, and some changes that could be attributed to ischemia or LV hypertrophy, his history argues against a cardiac cause.  There is an absence of focal neurologic deficits on exam, however he does exhibit some cognitive deficits as evidenced by him only recalling 1/3 objects.  We will continue the work-up to exclude more worrisome causes, but the most likely causes are medication side effect and orthostasis.  --Check orthostatic vital signs --Hold over the counter cough medication --Consult PT, OT to assess strength/functional status --Hold off on antibiotics now, but continue to reassess if fever persists or physical exam findings show clear      signs of infectious source.  --Continuous telemetry  Cognitive Impairment: Patient  with slow mentation that is able to recall only 1/3 objects.   --Check cognition with MoCA tomorrow.  --Will need neurology follow-up if signs of dementia  Elevated Creatinine: Creatinine is 1.26 but baseline is unknown.  Could indicate pre-renal AKI owing to decreased intravascular volume.  Urine was highly concentrated which would be consistent with decreased volume/dehydration.  --Encourage PO fluids --Recheck BMP in the morning  Fever/Leukocytosis: Temp of 100.7 in ED.  WBC 18K, but no obvious source of infection other than 1 month history of productive cough.  BP and O2 sat are stable at this time.  Unlikely that viral respiratory infection would cause this level of leukocytosis.  Could be due to myelodysplastic disorder in an elderly patient. Also the patient has history of gout and complained of knee pain that he attributed to gout. Unlikely to be due to septic joint at this time as the patient is not complaining of severe pain that limits mobility. Fever could also be attributed to anticholinergic use.   --Check WBC differential --No indication for antibiotics at this time --Reassess knee pain after admitted to hospital --Check CBC in AM  Hyperglycemia: Blood glucose 131 on admission.  No recorded history of DM.  Will check A1C to assess long-term glucose control --Check A1C  Diet: --Regular diet  DVT PPX: LMWH 40 mg, subQ daily  Dispo:  Patient will require admission for 1-2 days.     This is a Psychologist, occupational Note.  The care of the patient was discussed with Dr. Beckie Salts and the assessment and plan was formulated with their assistance.  Please see their note for official documentation of the patient encounter.   Signed: Dayton Scrape Sheyanne Munley, Med Student 02/02/2016, 4:32 PM

## 2016-02-02 NOTE — ED Notes (Signed)
Pt arrives via [facility], ems reports patient was standing in church, pts friends report he slumped over the pugh in front of him, pt was helped back into sitting position on his pugh and came to. pt a/o x4, skin warm and dry, VSS, nad.

## 2016-02-02 NOTE — H&P (Signed)
Date: 02/02/2016               Patient Name:  Jacob Barajas MRN: [id number]  DOB: [birth year] Age / Sex: 75 y.o., male   PCP: Jacob Matarharles W Phillips Jr., MD         Medical Service: Internal Medicine Teaching Service         Attending Physician: Dr. Burns SpainElizabeth A Butcher, MD    First Contact: Jacob Barajas Pager: [phone]  Second Contact: Dr. Beckie Barajas Pager: [phone]       After Hours (After 5p/  First Contact Pager: [phone]  weekends / holidays): Second Contact Pager: [phone]   Chief Complaint: Syncope  History of Present Illness: Mr. Jacob Barajas is a 75yo man with PMHx of gout who presents today after having a syncopal episode at church. He is accommpained by his friend/adopted daughter who provides some of the history. He does not recall falling down at church. He only remembers feeling sleepy and the next thing he remembers is waking up to people around him. His daughter does not know how long it took him to regain consciousness as she was not present at church. He denies any dizziness/lightheadedness, aura, vision/auditory changes, fevers, chills, chest pain, SOB, palpitations, or weakness that he experienced before he fell out. 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 His daughter reports he has been coughing for about a month now and she recently bought him OTC cough syrup which she states contains alcohol. She notes he has been sleepy while taking this medication. He did take some cough syrup prior to church this morning. His daughter notes he has been "going downhill" lately and that he has been living with her for the past few months.   Meds: No current facility-administered medications for this encounter.   No current outpatient prescriptions on file.    Allergies: Allergies as of 02/02/2016  . (No Known Allergies)   Past Medical History  Diagnosis Date  . Gout    Past Surgical History  Procedure Laterality Date    . Skin graft Right    Family History  Problem Relation Age of Onset  . Heart disease Brother   . Cancer Sister     "throat" cancer   Social History   Social History  . Marital Status: Widowed    Spouse Name: N/A  . Number of Children: N/A  . Years of Education: N/A   Occupational History  . Not on file.   Social History Main Topics  . Smoking status: Former Games developermoker  . Smokeless tobacco: Not on file  . Alcohol Use: No  . Drug Use: Not on file  . Sexual Activity: No   Other Topics Concern  . Not on file   Social History Narrative    Review of Systems: General: Denies night sweats, changes in weight, changes in appetite HEENT: Denies headaches, ear pain, rhinorrhea, sore throat CV: Denies orthopnea Pulm: Denies wheezing GI: Denies abdominal pain, nausea, vomiting, diarrhea, constipation, melena, hematochezia GU: Denies dysuria, hematuria, frequency Msk: Denies muscle cramps, joint pains Neuro: Denies weakness, numbness, tingling Skin: Denies rashes, bruising Psych: Denies depression, anxiety, hallucinations  Physical Exam: Blood pressure 141/78, pulse 78, temperature 100.7 F (38.2 C), temperature source Rectal, resp. rate 23, height 5\' 6"  (1.676 m), weight 188 lb (85.276 kg), SpO2 100 %. General: elderly man sitting up in bed, NAD HEENT: Jacob Barajas, EOMI, sclera anicteric, mucus membranes moist CV: RRR,  no m/g/r Pulm: CTA bilaterally, breaths non-labored Abd: BS+, soft, non-tender Ext: warm, no peripheral edema Neuro: alert and oriented x 3, no focal deficits. He only remembered 1/3 objects when asked to recall.   Lab results: Basic Metabolic Panel:  Recent Labs  40/98/11 1354  NA 141  K 3.9  CL 108  CO2 24  GLUCOSE 131*  BUN 23*  CREATININE 1.26*  CALCIUM 8.3*   CBC:  Recent Labs  02/02/16 1354  WBC 18.6*  HGB 12.9*  HCT 38.3*  MCV 95.8  PLT 188   Urinalysis:  Recent Labs  02/02/16 1523  COLORURINE AMBER*  LABSPEC 1.039*  PHURINE 5.5   GLUCOSEU NEGATIVE  HGBUR NEGATIVE  BILIRUBINUR SMALL*  KETONESUR 15*  PROTEINUR 30*  NITRITE NEGATIVE  LEUKOCYTESUR NEGATIVE    Imaging results:  Dg Chest Portable 1 View  02/02/2016  CLINICAL DATA:  75 year old male with history of syncope while it church this morning. EXAM: PORTABLE CHEST 1 VIEW COMPARISON:  Chest x-ray 03/08/2011. FINDINGS: Lung volumes are low. Ill-defined bibasilar opacities are presumably areas of atelectasis. No consolidative airspace disease. No pleural effusions. No pneumothorax. No pulmonary nodule or mass noted. Pulmonary vasculature and the cardiomediastinal silhouette are within normal limits. IMPRESSION: 1. Low lung volumes with probable bibasilar subsegmental atelectasis. Electronically Signed   By: Trudie Reed M.D.   On: 02/02/2016 15:15    Other results: EKG: Incomplete LBBB, no prior EKG to compare   Assessment & Plan by Problem:  Syncope: Patient presented with a witnessed syncopal episode. No seizure-like activity reported. He did have a low-grade fever and leukocytosis of 18,000 on admission, but no source of infection as CXR without evidence of infiltrates and UA clean. He did have a recent gout flare which could be contributing to his leukocytosis. Infection does not seem to be the likely cause at this point. His EKG was significant for an incomplete LBBB with no prior EKG to compare. He did not have any cardiac complaints. An arrhythmia could certainly explain his syncopal episode. Other possible causes include medications as he was taking OTC cough syrup likely containing alcohol and benadryl which has been making him feel sleepy the past few days. Also need to consider PE with his fever, elevated WBC count in the setting of syncope. Will check his orthostatics, keep him on tele, repeat EKG in AM, and get a TTE to rule out structural heart disease that could be contributing.  - Check orthostatic vital signs - Hold over the counter cough  medication - Consult PT, OT to assess strength/functional status - Hold off on antibiotics now, but continue to reassess if fever persists or physical exam findings show clearsigns of infectious source.  - Continuous telemetry - Get TTE - Get d-dimer for possible PE. Order CTA if d-dimer elevated.  Cognitive Impairment: Patient with slow mentation that is able to recall only 1/3 objects.  - Check cognition with MoCA tomorrow.  - Will need neurology follow-up if signs of dementia  Elevated Creatinine: Creatinine is 1.26 but baseline is unknown. Could indicate pre-renal AKI owing to decreased intravascular volume. Urine was highly concentrated which would be consistent with decreased volume/dehydration.  - Encourage PO fluids - Recheck BMP in the morning  Fever/Leukocytosis: Temp of 100.7 in ED. WBC 18K, but no obvious source of infection other than 1 month history of productive cough.BP and O2 sat are stable at this time. Unlikely that viral respiratory infection would cause this level of leukocytosis. Could be due to  myelodysplastic disorder in an elderly patient. Also the patient has history of gout and complained of knee pain that he attributed to gout. Unlikely to be due to septic joint at this time as the patient is not complaining of severe pain that limits mobility. Fever could also be attributed to anticholinergic use.  - Check WBC differential - No indication for antibiotics at this time - Reassess knee pain after admitted to hospital - Check CBC in AM  Hyperglycemia: Blood glucose 131 on admission. No recorded history of DM. Will check A1C to assess long-term glucose control. - Check A1C  Diet: Heart healthy VTE PPx: Lovenox SQ Dispo: Disposition is deferred at this time, awaiting improvement of current medical problems. Anticipated discharge in approximately 1-2 day(s).   The patient does have a current PCP Billey Chang Gaye Pollack., MD) and does need an [facility]  hospital follow-up appointment after discharge.  The patient does not have transportation limitations that hinder transportation to clinic appointments.  Signed: Su Hoff, MD 02/02/2016, 4:34 PM

## 2016-02-02 NOTE — ED Provider Notes (Signed)
CSN: [id number]     Arrival date & time 02/02/16  1259 History   First MD Initiated Contact with Patient 02/02/16 1332     Chief Complaint  Patient presents with  . Loss of Consciousness     (Consider location/radiation/quality/duration/timing/severity/associated sxs/prior Treatment) HPI This is a 75 year old previously healthy male who had a syncopal episode today in church. He states that he remains being in church the next thing he knew he was standing over him. He does not report any prodrome. He has been eating and drinking as usual. Has had a cough over the past month. He does not report any fever, chills, or dyspnea. Past Medical History  Diagnosis Date  . Gout    History reviewed. No pertinent past surgical history. No family history on file. Social History  Substance Use Topics  . Smoking status: Former Games developer  . Smokeless tobacco: None  . Alcohol Use: No    Review of Systems  All other systems reviewed and are negative.     Allergies  Review of patient's allergies indicates no known allergies.  Home Medications   Prior to Admission medications   Not on File   BP 135/76 mmHg  Pulse 77  Temp(Src) 98 F (36.7 C)  Resp 16  Ht  (1.676 m)  Wt 85.276 kg  BMI 30.36 kg/m2  SpO2 98% Physical Exam  Constitutional: He is oriented to person, place, and time. He appears well-developed and well-nourished.  HENT:  Head: Normocephalic and atraumatic.  Right Ear: External ear normal.  Left Ear: External ear normal.  Nose: Nose normal.  Mouth/Throat: Oropharynx is clear and moist.  Eyes: Conjunctivae and EOM are normal. Pupils are equal, round, and reactive to light.  Neck: Normal range of motion. Neck supple.  Cardiovascular: Normal rate, regular rhythm, normal heart sounds and intact distal pulses.   Pulmonary/Chest: Effort normal and breath sounds normal. No respiratory distress. He has no wheezes. He exhibits no tenderness.  Abdominal: Soft. Bowel sounds  are normal. He exhibits no distension and no mass. There is no tenderness. There is no guarding.  Musculoskeletal: Normal range of motion.  Neurological: He is alert and oriented to person, place, and time. He has normal reflexes. He exhibits normal muscle tone. Coordination normal.  Skin: Skin is warm and dry.  Psychiatric: He has a normal mood and affect. His behavior is normal. Judgment and thought content normal.  Nursing note and vitals reviewed.   ED Course  Procedures (including critical care time) Labs Review Labs Reviewed  BASIC METABOLIC PANEL - Abnormal; Notable for the following:    Glucose, Bld 131 (*)    BUN 23 (*)    Creatinine, Ser 1.26 (*)    Calcium 8.3 (*)    GFR calc non Af Amer 54 (*)    All other components within normal limits  CBC - Abnormal; Notable for the following:    WBC 18.6 (*)    RBC 4.00 (*)    Hemoglobin 12.9 (*)    HCT 38.3 (*)    All other components within normal limits  URINALYSIS, ROUTINE W REFLEX MICROSCOPIC (NOT AT [facility])  I-STAT TROPOININ, ED  CBG MONITORING, ED    Imaging Review Dg Chest Portable 1 View  02/02/2016  CLINICAL DATA:  75 year old male with history of syncope while it church this morning. EXAM: PORTABLE CHEST 1 VIEW COMPARISON:  Chest x-Shawnelle Spoerl 03/08/2011. FINDINGS: Lung volumes are low. Ill-defined bibasilar opacities are presumably areas of atelectasis. No consolidative airspace  disease. No pleural effusions. No pneumothorax. No pulmonary nodule or mass noted. Pulmonary vasculature and the cardiomediastinal silhouette are within normal limits. IMPRESSION: 1. Low lung volumes with probable bibasilar subsegmental atelectasis. Electronically Signed   By: Trudie Reedaniel  Entrikin M.D.   On: 02/02/2016 15:15   I have personally reviewed and evaluated these images and lab results as part of my medical decision-making.   EKG Interpretation   Date/Time:  Sunday February 02 2016 13:10:42 EDT Ventricular Rate:  81 PR Interval:  149 QRS  Duration: 117 QT Interval:  381 QTC Calculation: 442 R Axis:   26 Text Interpretation:  Sinus rhythm Incomplete left bundle branch block  Probable left ventricular hypertrophy Anterior Q waves, possibly due to  LVH Confirmed by Augustino Savastano MD, Duwayne HeckANIELLE ([id number]) on 02/02/2016 1:35:36 PM      MDM   Final diagnoses:  Syncope and collapse  Cough  Leukocytosis    75 year old man with syncopal episode in church today. White blood cell count is elevated 18,600 chest x-Sheehan Stacey with cough and elevated WBC.  Will treat with po levaquin. Blood pressure has been normal here. He'll be admitted for syncope and pneumonia.Discussed with Dr. Beckie Saltsivet and will put in for telemetry bed  Margarita Grizzleanielle Valetta Mulroy, MD 02/02/16 [phone]

## 2016-02-03 DIAGNOSIS — R739 Hyperglycemia, unspecified: Secondary | ICD-10-CM | POA: Diagnosis not present

## 2016-02-03 DIAGNOSIS — N19 Unspecified kidney failure: Secondary | ICD-10-CM

## 2016-02-03 DIAGNOSIS — R55 Syncope and collapse: Secondary | ICD-10-CM | POA: Diagnosis not present

## 2016-02-03 DIAGNOSIS — D72829 Elevated white blood cell count, unspecified: Secondary | ICD-10-CM

## 2016-02-03 DIAGNOSIS — M109 Gout, unspecified: Secondary | ICD-10-CM | POA: Diagnosis present

## 2016-02-03 DIAGNOSIS — F0390 Unspecified dementia without behavioral disturbance: Secondary | ICD-10-CM

## 2016-02-03 DIAGNOSIS — I951 Orthostatic hypotension: Secondary | ICD-10-CM | POA: Diagnosis present

## 2016-02-03 LAB — CBC WITH DIFFERENTIAL/PLATELET
BASOS ABS: 0 10*3/uL (ref 0.0–0.1)
Basophils Relative: 0 %
EOS ABS: 0.1 10*3/uL (ref 0.0–0.7)
Eosinophils Relative: 1 %
HCT: 37.5 % — ABNORMAL LOW (ref 39.0–52.0)
Hemoglobin: 12.8 g/dL — ABNORMAL LOW (ref 13.0–17.0)
LYMPHS PCT: 16 %
Lymphs Abs: 2.9 10*3/uL (ref 0.7–4.0)
MCH: 32.7 pg (ref 26.0–34.0)
MCHC: 34.1 g/dL (ref 30.0–36.0)
MCV: 95.9 fL (ref 78.0–100.0)
MONO ABS: 1.5 10*3/uL — AB (ref 0.1–1.0)
Monocytes Relative: 8 %
Neutro Abs: 13.3 10*3/uL — ABNORMAL HIGH (ref 1.7–7.7)
Neutrophils Relative %: 75 %
PLATELETS: 197 10*3/uL (ref 150–400)
RBC: 3.91 MIL/uL — AB (ref 4.22–5.81)
RDW: 13.9 % (ref 11.5–15.5)
WBC: 17.9 10*3/uL — ABNORMAL HIGH (ref 4.0–10.5)

## 2016-02-03 LAB — HEMOGLOBIN A1C
Hgb A1c MFr Bld: 5.5 % (ref 4.8–5.6)
MEAN PLASMA GLUCOSE: 111 mg/dL

## 2016-02-03 LAB — BASIC METABOLIC PANEL
Anion gap: 10 (ref 5–15)
BUN: 15 mg/dL (ref 6–20)
CHLORIDE: 107 mmol/L (ref 101–111)
CO2: 23 mmol/L (ref 22–32)
Calcium: 8.3 mg/dL — ABNORMAL LOW (ref 8.9–10.3)
Creatinine, Ser: 1.11 mg/dL (ref 0.61–1.24)
GFR calc Af Amer: >60 mL/min (ref >60)
GFR calc non Af Amer: >60 mL/min (ref >60)
GLUCOSE: 177 mg/dL — AB (ref 65–99)
POTASSIUM: 3.6 mmol/L (ref 3.5–5.1)
Sodium: 140 mmol/L (ref 135–145)

## 2016-02-03 LAB — TECHNOLOGIST SMEAR REVIEW

## 2016-02-03 LAB — HIV ANTIBODY (ROUTINE TESTING W REFLEX): HIV Screen 4th Generation wRfx: NONREACTIVE

## 2016-02-03 LAB — PROCALCITONIN: Procalcitonin: 0.1 ng/mL

## 2016-02-03 LAB — LACTIC ACID, PLASMA: Lactic Acid, Venous: 1.1 mmol/L (ref 0.5–2.0)

## 2016-02-03 LAB — TROPONIN I: Troponin I: <0.03 ng/mL (ref <0.031)

## 2016-02-03 MED ORDER — PNEUMOCOCCAL 13-VAL CONJ VACC IM SUSP
0.5000 mL | INTRAMUSCULAR | Status: AC
  Administered 2016-02-04: 0.5 mL via INTRAMUSCULAR
  Filled 2016-02-03: qty 0.5

## 2016-02-03 MED ORDER — PNEUMOCOCCAL VAC POLYVALENT 25 MCG/0.5ML IJ INJ: 0.5000 mL | INJECTION | INTRAMUSCULAR | Status: DC

## 2016-02-03 MED ORDER — AZITHROMYCIN 500 MG PO TABS: 500.0000 mg | ORAL_TABLET | Freq: Every day | ORAL | Status: DC

## 2016-02-03 MED ORDER — DEXTROSE 5 % IV SOLN
500.0000 mg | Freq: Once | INTRAVENOUS | Status: DC
  Filled 2016-02-03: qty 500

## 2016-02-03 MED ORDER — DEXTROSE 5 % IV SOLN
500.0000 mg | Freq: Once | INTRAVENOUS | Status: AC
  Administered 2016-02-03: 500 mg via INTRAVENOUS
  Filled 2016-02-03: qty 500

## 2016-02-03 NOTE — Progress Notes (Signed)
  Date: 02/03/2016  Patient name: Milagros LollJohn Kidd  Medical record number: [id number]  Date of birth: 09-Jan-1941   I have seen and evaluated Milagros LollJohn Schar and discussed their care with the Residency Team. Mr [name] is a 75 yo man who was admitted for syncope. The pt stated he was at church, doesn't remember if sitting or standing, and felt OK. Next thing he remembers is the EMS around him. He felt OK at that time also. He has never passed out before.   Today, he feels well. PT eval pt and feels he is indep.   PMHx, Fam Hx, and/or Soc Hx : Has one son who is incarcerated. Lives with "son in law" who likely is spouse of his "adopted" daughter. Still drives. Does odd jobs as a Nutritional therapistplumber. PMHx sig gout.   Filed Vitals:   02/03/16 0349 02/03/16 0736  BP: 113/57 132/65  Pulse: 96 83  Temp:  98.6 F (37 C)  Resp: 18 18  T max 100.7  Gen sitting in recliner. NAD HRRR no MRG ABD + BS Ext no edema  Cr 1.26 to 1.11 Trop negative Procalcitonin 0.1 LA 1.1 WBC 17.9 D dimer 0.54  I indep reviewed the CXR images and confirmed my reading with the official CXR reading. Increased biibasilar markings  I indep reviewed the EKG and confirmed my reading with the official EKG reading. Sinus, incomplete LBBB no old to compare  Assessment and Plan: I have seen and evaluated the patient as outlined above. I agree with the formulated Assessment and Plan as detailed in the residents' note, with the following changes:   1. Syncope - his hx of no prodrome and no subsequent sxs make cardiac arrhythmia most likely. His Trop R/O AMI. His tele is sinus. Other possible causes were PE but CT negative for PE. He is getting an ECHO to R/O cardiac obstruction. He is not orthostatic and not clear that this occurred after standing. He has no neuro abnl so CT not indicated. If the ECHO is nl, he may be D/C'd home with outpt F/U.   2. Renal failure - unknown if acute or chronic as he has no prior labs. Since trended down, some  component of acute.   3. Leukocytosis - All info point away from bacterial infxn. Stop ABX and F/U outpt. If remains elevated as outpt, MDS W/U.   Burns SpainElizabeth A Butcher, MD 4/17/201711:30 AM

## 2016-02-03 NOTE — Evaluation (Signed)
Occupational Therapy Evaluation Patient Details Name: Jacob LollJohn Barajas MRN: [id number] DOB: [birth year] Today's Date: 02/03/2016    History of Present Illness Mr. Jacob Barajas is a 75 y.o. Man with a history of gout, who presents to the ED after an episode of syncope   Clinical Impression   This 75 yo male admitted with above presents to acute OT with deficits below (see OT problem list) thus affecting his PLOF of independent with basic ADLs and IADLs as well as driving. He will benefit from acute OT without need for follow up.    Follow Up Recommendations  No OT follow up    Equipment Recommendations  None recommended by OT       Precautions / Restrictions Precautions Precautions: Fall Restrictions Weight Bearing Restrictions: No      Mobility Bed Mobility Overal bed mobility: Modified Independent             General bed mobility comments: HOB up  Transfers Overall transfer level: Needs assistance Equipment used: None Transfers: Sit to/from Stand Sit to Stand: Supervision         General transfer comment: Min guard A ambulating around in his room "furniture walking" --'I usually do this when I first get up    Balance Overall balance assessment: Needs assistance Sitting-balance support: Feet supported;No upper extremity supported Sitting balance-Leahy Scale: Good     Standing balance support: Single extremity supported;During functional activity Standing balance-Leahy Scale: Poor Standing balance comment: felt reliant to have one UE on something as he ambulated around the room                            ADL Overall ADL's : Needs assistance/impaired Eating/Feeding: Independent;Sitting   Grooming: Set up;Supervision/safety;Standing   Upper Body Bathing: Set up;Supervision/ safety;Sitting;Standing   Lower Body Bathing: Set up;Supervison/ safety;Sit to/from stand   Upper Body Dressing : Set up;Sitting   Lower Body Dressing: Set  up;Supervision/safety;Sit to/from stand   Toilet Transfer: Supervision/safety;Ambulation;Comfort height toilet;Grab bars   Toileting- Clothing Manipulation and Hygiene: Supervision/safety;Sit to/from stand                         Pertinent Vitals/Pain Pain Assessment: No/denies pain     Hand Dominance Right   Extremity/Trunk Assessment Upper Extremity Assessment Upper Extremity Assessment: Overall WFL for tasks assessed           Communication Communication Communication: HOH   Cognition Arousal/Alertness: Awake/alert Behavior During Therapy: WFL for tasks assessed/performed Overall Cognitive Status: Within Functional Limits for tasks assessed                                Home Living Family/patient expects to be discharged to:: Private residence Living Arrangements: Other relatives Available Help at Discharge: Family;Available PRN/intermittently (pt reports that someone is with him most of time) Type of Home: Mobile home Home Access: Stairs to enter Entrance Stairs-Number of Steps: 3 Entrance Stairs-Rails: Right;Left;Can reach both Home Layout: One level     Bathroom Shower/Tub: Producer, television/film/videoWalk-in shower   Bathroom Toilet: Handicapped height Bathroom Accessibility: Yes   Home Equipment: Cane - single point;Walker - 2 wheels;Shower seat          Prior Functioning/Environment Level of Independence: Independent             OT Diagnosis: Generalized weakness   OT Problem List: Decreased strength;Impaired balance (sitting and/or  standing)   OT Treatment/Interventions: Self-care/ADL training;Patient/family education;Balance training;DME and/or AE instruction    OT Goals(Current goals can be found in the care plan section) Acute Rehab OT Goals Patient Stated Goal: go home today OT Goal Formulation: With patient Time For Goal Achievement: 02/10/16 Potential to Achieve Goals: Good  OT Frequency: Min 2X/week              End of Session  Equipment Utilized During Treatment:  (none) Nurse Communication:  (Pt reports he has had this bad cough for over a month now and has not sought any medical attention for it (has been taking over the counter cough medicine and cough drops)  Activity Tolerance:  (Pt having a coughing "fit" when I entered room, enough to take his breath away and noted increased work of breathing) Patient left: in chair;with call bell/phone within reach;with chair alarm set   Time: 1610-9604 OT Time Calculation (min): 9 min Charges:  OT General Charges $OT Visit: 1 Procedure OT Evaluation $OT Eval Moderate Complexity: 1 Procedure   Evette Georges [phone] 02/03/2016, 3:35 PM

## 2016-02-03 NOTE — Progress Notes (Signed)
Subjective: Jacob Barajas slept well last night and says he feels about the same as yesterday.  When Dr. Rogelia Boga asked him to recount the events surrounding his LOC, he gave a similar story as yesterday in the ED.  Denies pain, palpitations, shortness of breath.  Ate well this morning.   Objective: Vital signs in last 24 hours: Filed Vitals:   02/03/16 0349 02/03/16 0359 02/03/16 0736 02/03/16 1144  BP: 113/57  132/65 149/69  Pulse: 96  83 88  Temp:   98.6 F (37 C) 97.9 F (36.6 C)  TempSrc: Oral  Oral Oral  Resp: Height:      Weight:  76.975 kg (169 lb 11.2 oz)    SpO2: 95%  93% 98%   Weight change:   Intake/Output Summary (Last 24 hours) at 02/03/16 1209 Last data filed at 02/03/16 0848  Gross per 24 hour  Intake 1172.5 ml  Output    450 ml  Net  722.5 ml   General appearance: alert Head: Normocephalic, without obvious abnormality, atraumatic Lungs: clear to auscultation bilaterally Heart: regular rate and rhythm Abdomen: soft, non-tender; bowel sounds normal; no masses,  no organomegaly Extremities: extremities normal, atraumatic, no cyanosis or edema   Lab Results: Basic Metabolic Panel:  Recent Labs Lab 02/02/16 1354 02/03/16 0849  NA 141 140  K 3.9 3.6  CL 108 107  CO2 24 23  GLUCOSE 131* 177*  BUN 23* 15  CREATININE 1.26* 1.11  CALCIUM 8.3* 8.3*   CBC:  Recent Labs Lab 02/02/16 1800 02/03/16 0050  WBC 20.5* 17.9*  NEUTROABS 16.5* 13.3*  HGB 12.9* 12.8*  HCT 37.9* 37.5*  MCV 95.2 95.9  PLT 218 197   Cardiac Enzymes:  Recent Labs Lab 02/03/16 0849  TROPONINI <0.03   D-Dimer:  Recent Labs Lab 02/02/16 2023  DDIMER 0.54*   Hemoglobin A1C:  Recent Labs Lab 02/02/16 1800  HGBA1C 5.5   Urinalysis:  Recent Labs Lab 02/02/16 1523  COLORURINE AMBER*  LABSPEC 1.039*  PHURINE 5.5  GLUCOSEU NEGATIVE  HGBUR NEGATIVE  BILIRUBINUR SMALL*  KETONESUR 15*  PROTEINUR 30*  NITRITE NEGATIVE  LEUKOCYTESUR NEGATIVE   MoCA:  20/30  Medications:  Scheduled Meds: . enoxaparin (LOVENOX) injection  40 mg Subcutaneous Q24H  . [START ON 02/04/2016] pneumococcal 13-valent conjugate vaccine  0.5 mL Intramuscular Tomorrow-1000  . sodium chloride flush  3 mL Intravenous Q12H   Continuous Infusions:  PRN Meds:.dextromethorphan Assessment/Plan: Active Problems:   Syncope   Orthostatic hypotension   Gout  Jacob Barajas is a 75 y.o. Man with no known significant past medical history who presented to the ED with 1 episode of syncope, likely cognitive impairment, concentrated urine, low-grade fever, and leukocytosis in the setting of prior use of OTC cough syrup suggesting orthostasis secondary to infection or dehydration vs. medication side effect.   Syncope: Patient with witnessed syncopal episode after taking medication that likely contained alcohol and diphenhydramine. Further work-up revealed a normal troponin, and telemetry was negative for arrhythmias. Creatinine improved with IV fluids overnight to 1.1, which argues that he was potentially dehydrated.  Orthostatic vital signs were borderline.  PT worked with him this morning and do not have any recommendations and they do not think he needs further assessment.  Overnight he had another mild fever to 100.4. Blood cultures were drawn and he was given one dose of azithromycin before a pro-calcitonin returned <0.1 and antibiotics were discontinued due to decreased likelihood of a bacterial infection.  A d-dimer was slightly elevated at 0.54 and a CTA was negative for PE.  Still needs TTE as part of his work up.  Will plan to discharge to home with close follow-up with Internal Medicine clinic if family can confirm that he has adequate supervision at home.  Most likely cause on the differential at this point in time is hypovolemia leading to orthostasis.  --OT to see today --Hold off on antibiotics now --TTE  --Continuous telemetry  Cognitive Impairment: Patient with slow  mentation that is able to recall only 1/3 objects. Scored 20/30 on MoCA suggesting cognitive impairment/mild dementia.  Mostly likely Alzheimer vs vascular dementia.  Will need close follow-up with internal medicine clinic.   --Schedule appointment to be seen at Internal Medicine for close follow-up and further evaluation  Elevated Creatinine: Creatinine is 1.26 but baseline is unknown.Down to 1.1 today indicating likely pre-renal etiology since it improved with fluids.   --Encourage PO fluids --Hold IV fluids for now as he seems euvolemic  Fever/Leukocytosis: Temp of 100.7 in ED. WBC 18K, but no obvious source of infection other than 1 month history of productive cough. BP and O2 sat are stable at this time.Blood cultures drawn last night after temp of 100.4.  Pro-calcitonin was <0.1.  Was briefly started on Azithromycin before pro-calcitonin returned.  WBC still elevated.  Differential showed neutrophilic leukocytosis so unlikely to be caused by myelodysplastic disorder. Is currently afebrile.  No obvious source of infection at this time.  Likely is a viral etiology.     --No indication for antibiotics at this time  Hyperglycemia: Blood glucose 131 on admission. No recorded history of DM.A1C returned 5.5.  No indication for insulin or oral medication at this time.   Diet: --Regular diet  DVT PPX: LMWH 40 mg, subQ daily  Dispo:  Patient will require admission for 1-2 days.  This is a Psychologist, occupationalMedical Student Note.  The care of the patient was discussed with Dr. Yetta BarreJones and the assessment and plan formulated with their assistance.  Please see their attached note for official documentation of the daily encounter.   LOS: 1 day   [name], Med Student 02/03/2016, 12:09 PM

## 2016-02-03 NOTE — Progress Notes (Signed)
Subjective: No complaints. Feels better today.   Objective: Vital signs in last 24 hours: Filed Vitals:   02/03/16 0017 02/03/16 0349 02/03/16 0359 02/03/16 0736  BP: 129/60 113/57  132/65  Pulse: 91 96  83  Temp: 99.3 F (37.4 C)   98.6 F (37 C)  TempSrc: Oral Oral  Oral  Resp: Height:      Weight:   169 lb 11.2 oz (76.975 kg)   SpO2: 93% 95%  93%   Weight change:   Intake/Output Summary (Last 24 hours) at 02/03/16 1037 Last data filed at 02/03/16 0848  Gross per 24 hour  Intake 1172.5 ml  Output    450 ml  Net  722.5 ml   Physical Exam: General: Elderly white male, alert, cooperative, NAD. HEENT: PERRL, EOMI. Moist mucus membranes Neck: Full range of motion without pain, supple, no lymphadenopathy or carotid bruits Lungs: Clear to ascultation bilaterally, normal work of respiration, no wheezes, rales, rhonchi Heart: RRR, no murmurs, gallops, or rubs Abdomen: Soft, non-tender, non-distended, BS + Extremities: No cyanosis, clubbing, or edema Neurologic: Alert & oriented X3, cranial nerves II-XII intact, strength grossly intact, sensation intact to light touch   Lab Results: Basic Metabolic Panel:  Recent Labs Lab 02/02/16 1354 02/03/16 0849  NA 141 140  K 3.9 3.6  CL 108 107  CO2 24 23  GLUCOSE 131* 177*  BUN 23* 15  CREATININE 1.26* 1.11  CALCIUM 8.3* 8.3*   CBC:  Recent Labs Lab 02/02/16 1800 02/03/16 0050  WBC 20.5* 17.9*  NEUTROABS 16.5* 13.3*  HGB 12.9* 12.8*  HCT 37.9* 37.5*  MCV 95.2 95.9  PLT 218 197   Cardiac Enzymes:  Recent Labs Lab 02/03/16 0849  TROPONINI <0.03   D-Dimer:  Recent Labs Lab 02/02/16 2023  DDIMER 0.54*   Hemoglobin A1C:  Recent Labs Lab 02/02/16 1800  HGBA1C 5.5   Urinalysis:  Recent Labs Lab 02/02/16 1523  COLORURINE AMBER*  LABSPEC 1.039*  PHURINE 5.5  GLUCOSEU NEGATIVE  HGBUR NEGATIVE  BILIRUBINUR SMALL*  KETONESUR 15*  PROTEINUR 30*  NITRITE NEGATIVE  LEUKOCYTESUR  NEGATIVE   Studies/Results: Ct Angio Chest Pe W/cm &/or Wo Cm  02/03/2016  CLINICAL DATA:  Cough for 1 month with syncope. EXAM: CT ANGIOGRAPHY CHEST WITH CONTRAST TECHNIQUE: Multidetector CT imaging of the chest was performed using the standard protocol during bolus administration of intravenous contrast. Multiplanar CT image reconstructions and MIPs were obtained to evaluate the vascular anatomy. CONTRAST:  80 mL Isovue 370 COMPARISON:  None. FINDINGS: Technically adequate study with good opacification of the central and segmental pulmonary arteries. No focal filling defects are demonstrated. No evidence of significant pulmonary embolus. Normal heart size. Normal caliber thoracic aorta. No aortic dissection. Great vessel origins are patent. Calcification in the aorta and coronary arteries. Scattered lymph nodes in the mediastinum are not pathologically enlarged. Esophagus is mostly decompressed. Lungs demonstrate prominent diffuse bronchial wall thickening most prominent centrally and in the lung bases. Mucous plugging in the lung bases. Patchy peripheral zone infiltrates. Findings likely represent bronchitis although atypical mycobacterial process could also have this appearance. Included portions of the upper abdominal organs are grossly unremarkable. Degenerative changes in the spine. No destructive bone lesions. Review of the MIP images confirms the above findings. IMPRESSION: No evidence of significant pulmonary embolus. Prominent bronchial wall thickening is plugging and slight peripheral zone infiltrates. Changes likely represent bronchitis versus atypical microbacterial infection. Electronically Signed   By: Marisa Cyphers.D.  On: 02/03/2016 00:17   Dg Chest Portable 1 View  02/02/2016  CLINICAL DATA:  75 year old male with history of syncope while it church this morning. EXAM: PORTABLE CHEST 1 VIEW COMPARISON:  Chest x-ray 03/08/2011. FINDINGS: Lung volumes are low. Ill-defined bibasilar  opacities are presumably areas of atelectasis. No consolidative airspace disease. No pleural effusions. No pneumothorax. No pulmonary nodule or mass noted. Pulmonary vasculature and the cardiomediastinal silhouette are within normal limits. IMPRESSION: 1. Low lung volumes with probable bibasilar subsegmental atelectasis. Electronically Signed   By: Trudie Reedaniel  Entrikin M.D.   On: 02/02/2016 15:15   Medications: I have reviewed the patient's current medications. Scheduled Meds: . enoxaparin (LOVENOX) injection  40 mg Subcutaneous Q24H  . [START ON 02/04/2016] pneumococcal 13-valent conjugate vaccine  0.5 mL Intramuscular Tomorrow-1000  . sodium chloride flush  3 mL Intravenous Q12H   Continuous Infusions:  PRN Meds:.dextromethorphan  Assessment/Plan: 75 y.o. male w/ PMHx of Gout and ?dementia, admitted with an episode of syncope at church.   Syncope: Suspect this is mostly likely orthostatic vs vaso-vagal. EKG with incomplete RBBB, no ectopy seen on telemetry. Troponin negative. CTA chest negative for PE.  -Plan for ECHO (still pending) -D/c IVF (no orthostasis) -Encourage po intake  Dementia: MOCA significant for 19/30 score suggestive of dementia. PT recommends no further follow up. Discussed with family, says he is rarely unsupervised, has assistance in ALL IADL's. Do not feel patient should be allowed to drive, family agreed.  -[facility]  Elevated Creatinine: Resolved.  -Follow up BMP in clinic  Fever/Leukocytosis: No further fevers. Suspect this is likely related to bronchitis vs atypical bacterial infection.  -Repeat CBC in AM  Hyperglycemia: CBG mildly elevated, HbA1c 5.5.  -Follow up in outpatient clinic  Dispo: Disposition is deferred at this time, awaiting improvement of current medical problems.  Anticipated discharge in approximately 1-2 day(s).   The patient does not have a current PCP Billey Chang(Charles W Gaye PollackPhillips Jr., MD) and does need [facility]  follow-up appointment after discharge.  The patient does not have transportation limitations that hinder transportation to clinic appointments.  .Services Needed at time of discharge: Y = Yes, Blank = No PT:   OT:   RN:   Equipment:   Other:     LOS: 1 day   Courtney ParisEden W Jaxyn Mestas, MD 02/03/2016, 10:37 AM

## 2016-02-03 NOTE — Evaluation (Signed)
Physical Therapy Evaluation and Discharge  Patient Details Name: Jacob Barajas MRN: [id number] DOB: 1940/10/29 Today's Date: 02/03/2016   History of Present Illness  Jacob Barajas is a 75 y.o. Man with a history of gout, who presents to the ED after an episode of syncope  Clinical Impression  Patient evaluated by Physical Therapy with no further acute PT needs identified. PT is signing off. Thank you for this referral.     Follow Up Recommendations No PT follow up    Equipment Recommendations  None recommended by PT    Recommendations for Other Services       Precautions / Restrictions        Mobility  Bed Mobility Overal bed mobility: Modified Independent                Transfers Overall transfer level: Independent Equipment used: None                Ambulation/Gait Ambulation/Gait assistance: Supervision;Modified independent (Device/Increase time) Ambulation Distance (Feet): 200 Feet Assistive device: None Gait Pattern/deviations: Step-through pattern;Decreased stride length;Antalgic Gait velocity: ver slwo Gait velocity interpretation: Below normal speed for age/gender General Gait Details: slightly antalgic with reports of longstanding Lt knee pain  Stairs            Wheelchair Mobility    Modified Rankin (Stroke Patients Only)       Balance Overall balance assessment: Modified Independent                                           Pertinent Vitals/Pain HR 85-98 SaO2 93-97% on room air  Pain Assessment: Faces Faces Pain Scale: Hurts little more Pain Location: Lt knee OA Pain Descriptors / Indicators: Aching Pain Intervention(s): Limited activity within patient's tolerance;Repositioned    Home Living Family/patient expects to be discharged to:: Private residence Living Arrangements: Other relatives (son in Social workerlaw) Available Help at Discharge: Family;Available PRN/intermittently Type of Home: Mobile home Home Access:  Stairs to enter Entrance Stairs-Rails: Right;Left;Can reach both Entrance Stairs-Number of Steps: 3 Home Layout: One level Home Equipment: Cane - single point;Walker - 2 wheels      Prior Function Level of Independence: Independent         Comments: plumber     Hand Dominance        Extremity/Trunk Assessment   Upper Extremity Assessment: Overall WFL for tasks assessed           Lower Extremity Assessment: Overall WFL for tasks assessed      Cervical / Trunk Assessment: Normal  Communication   Communication: HOH  Cognition Arousal/Alertness: Awake/alert Behavior During Therapy: WFL for tasks assessed/performed Overall Cognitive Status: Within Functional Limits for tasks assessed                      General Comments      Exercises        Assessment/Plan    PT Assessment Patent does not need any further PT services  PT Diagnosis Difficulty walking   PT Problem List    PT Treatment Interventions     PT Goals (Current goals can be found in the Care Plan section) Acute Rehab PT Goals Patient Stated Goal: go home today PT Goal Formulation: All assessment and education complete, DC therapy    Frequency     Barriers to discharge  Co-evaluation               End of Session Equipment Utilized During Treatment: Gait belt Activity Tolerance: Patient tolerated treatment well Patient left: in chair;with call bell/phone within reach;with chair alarm set Nurse Communication: Mobility status (no PT or DME needs)         Time: 1610-9604 PT Time Calculation (min) (ACUTE ONLY): 22 min   Charges:   PT Evaluation $PT Eval Low Complexity: 1 Procedure     PT G Codes:        Jacob Barajas 02/08/2016, 9:45 AM Pager [phone]

## 2016-02-04 ENCOUNTER — Observation Stay (HOSPITAL_BASED_OUTPATIENT_CLINIC_OR_DEPARTMENT_OTHER): Payer: Medicare Other

## 2016-02-04 DIAGNOSIS — R55 Syncope and collapse: Secondary | ICD-10-CM | POA: Diagnosis not present

## 2016-02-04 LAB — CBC WITH DIFFERENTIAL/PLATELET
BASOS ABS: 0.1 10*3/uL (ref 0.0–0.1)
Basophils Relative: 0 %
EOS PCT: 3 %
Eosinophils Absolute: 0.4 10*3/uL (ref 0.0–0.7)
HEMATOCRIT: 38 % — AB (ref 39.0–52.0)
Hemoglobin: 12.6 g/dL — ABNORMAL LOW (ref 13.0–17.0)
LYMPHS PCT: 28 %
Lymphs Abs: 3.5 10*3/uL (ref 0.7–4.0)
MCH: 31.6 pg (ref 26.0–34.0)
MCHC: 33.2 g/dL (ref 30.0–36.0)
MCV: 95.2 fL (ref 78.0–100.0)
Monocytes Absolute: 1.3 10*3/uL — ABNORMAL HIGH (ref 0.1–1.0)
Monocytes Relative: 10 %
NEUTROS ABS: 7.4 10*3/uL (ref 1.7–7.7)
Neutrophils Relative %: 59 %
PLATELETS: 221 10*3/uL (ref 150–400)
RBC: 3.99 MIL/uL — AB (ref 4.22–5.81)
RDW: 13.6 % (ref 11.5–15.5)
WBC: 12.6 10*3/uL — AB (ref 4.0–10.5)

## 2016-02-04 LAB — ECHOCARDIOGRAM COMPLETE
Height: 69 in
Weight: 2678.4 oz

## 2016-02-04 LAB — TSH: TSH: 2.335 u[IU]/mL (ref 0.350–4.500)

## 2016-02-04 NOTE — Progress Notes (Signed)
  Date: 02/04/2016  Patient name: Jacob Barajas  Medical record number: [id number]  Date of birth: Aug 09, 1941   This patient has been seen and the plan of care was discussed with the house staff. Please see their note for complete details. I concur with their findings with the following additions/corrections: Mr [name] cont to feel well. He is ready to leave the hospital. ECHO is done but results pending. Will need [facility] F/U for memory loss and preventative care.  Burns SpainElizabeth A Butcher, MD 02/04/2016, 12:43 PM

## 2016-02-04 NOTE — Progress Notes (Signed)
Subjective: No acute events overnight. He is feeling good today, no complaints.   Objective: Vital signs in last 24 hours: Filed Vitals:   02/03/16 1527 02/03/16 2119 02/04/16 0412 02/04/16 0618  BP: 145/77 134/67  120/61  Pulse: 74 82  79  Temp:  99.2 F (37.3 C)  98.5 F (36.9 C)  TempSrc:  Oral  Oral  Resp:  18  17  Height:      Weight:   167 lb 6.4 oz (75.932 kg)   SpO2: 96% 96%  95%   Weight change: -20 lb 9.6 oz (-9.344 kg)  Intake/Output Summary (Last 24 hours) at 02/04/16 0805 Last data filed at 02/04/16 0004  Gross per 24 hour  Intake    960 ml  Output    975 ml  Net    -15 ml   Physical Exam: General: Elderly white man sitting up in bed, alert, cooperative, NAD. HEENT: PERRL, EOMI. Moist mucus membranes Lungs: CTA bilaterally, breaths non-labored  Heart: RRR, no m/g/r Abd: BS+, soft, non-tender, non-distended Ext: No peripheral edema  Neuro: Alert and oriented X 3   Lab Results: Basic Metabolic Panel:  Recent Labs Lab 02/02/16 1354 02/03/16 0849  NA 141 140  K 3.9 3.6  CL 108 107  CO2 24 23  GLUCOSE 131* 177*  BUN 23* 15  CREATININE 1.26* 1.11  CALCIUM 8.3* 8.3*   CBC:  Recent Labs Lab 02/03/16 0050 02/04/16 0430  WBC 17.9* 12.6*  NEUTROABS 13.3* 7.4  HGB 12.8* 12.6*  HCT 37.5* 38.0*  MCV 95.9 95.2  PLT 197 221   Cardiac Enzymes:  Recent Labs Lab 02/03/16 0849  TROPONINI <0.03   D-Dimer:  Recent Labs Lab 02/02/16 2023  DDIMER 0.54*   Hemoglobin A1C:  Recent Labs Lab 02/02/16 1800  HGBA1C 5.5   Urinalysis:  Recent Labs Lab 02/02/16 1523  COLORURINE AMBER*  LABSPEC 1.039*  PHURINE 5.5  GLUCOSEU NEGATIVE  HGBUR NEGATIVE  BILIRUBINUR SMALL*  KETONESUR 15*  PROTEINUR 30*  NITRITE NEGATIVE  LEUKOCYTESUR NEGATIVE   Studies/Results: Ct Angio Chest Pe W/cm &/or Wo Cm  02/03/2016  CLINICAL DATA:  Cough for 1 month with syncope. EXAM: CT ANGIOGRAPHY CHEST WITH CONTRAST TECHNIQUE: Multidetector CT imaging of  the chest was performed using the standard protocol during bolus administration of intravenous contrast. Multiplanar CT image reconstructions and MIPs were obtained to evaluate the vascular anatomy. CONTRAST:  80 mL Isovue 370 COMPARISON:  None. FINDINGS: Technically adequate study with good opacification of the central and segmental pulmonary arteries. No focal filling defects are demonstrated. No evidence of significant pulmonary embolus. Normal heart size. Normal caliber thoracic aorta. No aortic dissection. Great vessel origins are patent. Calcification in the aorta and coronary arteries. Scattered lymph nodes in the mediastinum are not pathologically enlarged. Esophagus is mostly decompressed. Lungs demonstrate prominent diffuse bronchial wall thickening most prominent centrally and in the lung bases. Mucous plugging in the lung bases. Patchy peripheral zone infiltrates. Findings likely represent bronchitis although atypical mycobacterial process could also have this appearance. Included portions of the upper abdominal organs are grossly unremarkable. Degenerative changes in the spine. No destructive bone lesions. Review of the MIP images confirms the above findings. IMPRESSION: No evidence of significant pulmonary embolus. Prominent bronchial wall thickening is plugging and slight peripheral zone infiltrates. Changes likely represent bronchitis versus atypical microbacterial infection. Electronically Signed   By: Burman Nieves M.D.   On: 02/03/2016 00:17   Dg Chest Portable 1 View  02/02/2016  CLINICAL  DATA:  75 year old male with history of syncope while it church this morning. EXAM: PORTABLE CHEST 1 VIEW COMPARISON:  Chest x-ray 03/08/2011. FINDINGS: Lung volumes are low. Ill-defined bibasilar opacities are presumably areas of atelectasis. No consolidative airspace disease. No pleural effusions. No pneumothorax. No pulmonary nodule or mass noted. Pulmonary vasculature and the cardiomediastinal  silhouette are within normal limits. IMPRESSION: 1. Low lung volumes with probable bibasilar subsegmental atelectasis. Electronically Signed   By: Trudie Reedaniel  Entrikin M.D.   On: 02/02/2016 15:15   Medications: I have reviewed the patient's current medications. Scheduled Meds: . enoxaparin (LOVENOX) injection  40 mg Subcutaneous Q24H  . pneumococcal 13-valent conjugate vaccine  0.5 mL Intramuscular Tomorrow-1000  . sodium chloride flush  3 mL Intravenous Q12H   Continuous Infusions:  PRN Meds:.dextromethorphan  Assessment/Plan: 75 y.o. man w/ PMHx of Gout and ?dementia, admitted with an episode of syncope at church.   Syncope: Suspect this is mostly likely orthostatic vs vaso-vagal. EKG with incomplete RBBB, no ectopy seen on telemetry. Troponin negative. CTA chest negative for PE. Awaiting echo which should be completed today and then can discharge home.  - f/u ECHO (still pending) - Encourage po intake  Dementia: MOCA significant for 19/30 score suggestive of dementia. PT recommends no further follow up. Discussed with family, says he is rarely unsupervised, has assistance in ALL IADL's. Do not feel patient should be allowed to drive, family agreed. Will check TSH to rule out as reversible cause for his dementia. He should also receive a MRI scan of his head to rule out normal pressure hydrocephalus given his dementia, urinary incontinence, and wide based gait. This will be done as an outpatient. - Will [facility] - Will need MRI brain scheduled   Elevated Creatinine: Resolved.  - Follow up BMP in clinic  Fever/Leukocytosis: No further fevers. Suspect this is likely related to bronchitis vs atypical bacterial infection. His WBC count improved from 17.9 to 12.6 this morning.   Hyperglycemia: CBG mildly elevated, HbA1c 5.5.  - Follow up in outpatient clinic  Diet: Heart healthy VTE PPx: Lovenox SQ Dispo: Discharge today pending echo results   The patient does not  have a current PCP Billey Chang(Charles W Gaye PollackPhillips Jr., MD) and does need an [facility] follow-up appointment after discharge.  The patient does not have transportation limitations that hinder transportation to clinic appointments.  .Services Needed at time of discharge: Y = Yes, Blank = No PT:   OT:   RN:   Equipment:   Other:     LOS: 2 days   Su Hoffarly J Rivet, MD 02/04/2016, 8:05 AM

## 2016-02-04 NOTE — Progress Notes (Signed)
  Echocardiogram 2D Echocardiogram has been performed.  Cathie BeamsGREGORY, Emersen Carroll 02/04/2016, 12:27 PM

## 2016-02-04 NOTE — Discharge Summary (Signed)
Name: Jacob Barajas MRN: [id number] DOB: 1941-02-22 75 y.o. PCP: Marcine Matar., MD  Date of Admission: 02/02/2016 12:59 PM Date of Discharge: 02/04/2016 Attending Physician: Burns Spain, MD  Discharge Diagnosis: Principal Problem Syncope Active Problems Dementia Elevated Creatinine Fever/Leukocytosis Hyperglycemia  Discharge Medications:   Medication List    Notice    You have not been prescribed any medications.      Disposition and follow-up:   Jacob Barajas was discharged from [facility] in Good condition.  At the hospital follow up visit please address:  1.  Syncope: Determine if patient has had any further episodes of syncope. His syncope was thought to be vasovagal mediated.  Dementia: MOCA 19/30. TSH nml. Will need MRI outpatient to rule out normal pressure hydrocephalus.  Elevated Cr: Cr 1.26 on admission, improved to 1.1 by discharge. No labs to compare. Will need repeat bmet at outpatient visit. Hyperglycemia: Glucose mildly elevated during hospitalization. A1c 5.5. Will need to continue to monitor periodically.  2.  Labs / imaging needed at time of follow-up: bmet, MRI brain  3.  Pending labs/ test needing follow-up: None  Follow-up Appointments:     Follow-up Information    Follow up with [name], MD. Go on 02/13/2016.   Specialty:  Internal Medicine   Why:  Appointment at 3:15   Contact information:   [address] [place] [address] [phone]       Discharge Instructions: Discharge Instructions    Diet - low sodium heart healthy    Complete by:  As directed      Increase activity slowly    Complete by:  As directed            Consultations:    Procedures Performed:  Ct Angio Chest Pe W/cm &/or Wo Cm  02/03/2016  CLINICAL DATA:  Cough for 1 month with syncope. EXAM: CT ANGIOGRAPHY CHEST WITH CONTRAST TECHNIQUE: Multidetector CT imaging of the chest was performed using the standard protocol  during bolus administration of intravenous contrast. Multiplanar CT image reconstructions and MIPs were obtained to evaluate the vascular anatomy. CONTRAST:  80 mL Isovue 370 COMPARISON:  None. FINDINGS: Technically adequate study with good opacification of the central and segmental pulmonary arteries. No focal filling defects are demonstrated. No evidence of significant pulmonary embolus. Normal heart size. Normal caliber thoracic aorta. No aortic dissection. Great vessel origins are patent. Calcification in the aorta and coronary arteries. Scattered lymph nodes in the mediastinum are not pathologically enlarged. Esophagus is mostly decompressed. Lungs demonstrate prominent diffuse bronchial wall thickening most prominent centrally and in the lung bases. Mucous plugging in the lung bases. Patchy peripheral zone infiltrates. Findings likely represent bronchitis although atypical mycobacterial process could also have this appearance. Included portions of the upper abdominal organs are grossly unremarkable. Degenerative changes in the spine. No destructive bone lesions. Review of the MIP images confirms the above findings. IMPRESSION: No evidence of significant pulmonary embolus. Prominent bronchial wall thickening is plugging and slight peripheral zone infiltrates. Changes likely represent bronchitis versus atypical microbacterial infection. Electronically Signed   By: Burman Nieves M.D.   On: 02/03/2016 00:17   Dg Chest Portable 1 View  02/02/2016  CLINICAL DATA:  75 year old male with history of syncope while it church this morning. EXAM: PORTABLE CHEST 1 VIEW COMPARISON:  Chest x-ray 03/08/2011. FINDINGS: Lung volumes are low. Ill-defined bibasilar opacities are presumably areas of atelectasis. No consolidative airspace disease. No pleural effusions. No pneumothorax. No pulmonary  nodule or mass noted. Pulmonary vasculature and the cardiomediastinal silhouette are within normal limits. IMPRESSION: 1. Low lung  volumes with probable bibasilar subsegmental atelectasis. Electronically Signed   By: Trudie Reed M.D.   On: 02/02/2016 15:15    2D Echo:  02/04/16 Study Conclusions - Left ventricle: The cavity size was normal. There was mild focal  basal hypertrophy of the septum. Systolic function was normal.  The estimated ejection fraction was in the range of 60% to 65%.  Wall motion was normal; there were no regional wall motion  abnormalities. Doppler parameters are consistent with abnormal  left ventricular relaxation (grade 1 diastolic dysfunction). - Aortic valve: Trileaflet; mildly thickened, mildly calcified  leaflets. Transvalvular velocity was within the normal range.  There was no stenosis. - Mitral valve: Calcified annulus. There was mild regurgitation.   Admission HPI: Jacob Barajas is a 75yo man with PMHx of gout who presents today after having a syncopal episode at church. He is accommpained by his friend/adopted daughter who provides some of the history. He does not recall falling down at church. He only remembers feeling sleepy and the next thing he remembers is waking up to people around him. His daughter does not know how long it took him to regain consciousness as she was not present at church. He denies any dizziness/lightheadedness, aura, vision/auditory changes, fevers, chills, chest pain, SOB, palpitations, or weakness that he experienced before he fell out. 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 His daughter reports he has been coughing for about a month now and she recently bought him OTC cough syrup which she states contains alcohol. She notes he has been sleepy while taking this medication. He did take some cough syrup prior to church this morning. His daughter notes he has been "going downhill" lately and that he has been living with her for the past few months.    Hospital Course by problem  list:   Syncope: Jacob Barajas presented after having a witnessed syncopal episode at church. No seizure-like activity noted. His EKG showed an incomplete LBBB with no prior to compare. A CT Angio Chest was obtained as PE was a concern with his mild fever and elevated WBC count, but this was negative. He had no evidence of infection. Orthostatic VS were negative. Echo showed normal EF with no wall motion abnormalities. His episode of syncope was most likely vasovagal-mediated vs arrhythmia. He will have follow up in the IM clinic.   Discharge Vitals:   BP 137/69 mmHg  Pulse 75  Temp(Src) 98.5 F (36.9 C) (Oral)  Resp 18  Ht  (1.753 m)  Wt 167 lb 6.4 oz (75.932 kg)  BMI 24.71 kg/m2  SpO2 96% Physical Exam: General: Elderly white man sitting up in bed, alert, cooperative, NAD. HEENT: PERRL, EOMI. Moist mucus membranes Lungs: CTA bilaterally, breaths non-labored  Heart: RRR, no m/g/r Abd: BS+, soft, non-tender, non-distended Ext: No peripheral edema  Neuro: Alert and oriented X 3  Discharge Labs:  Results for orders placed or performed during the hospital encounter of 02/02/16 (from the past 24 hour(s))  CBC with Differential/Platelet     Status: Abnormal   Collection Time: 02/04/16  4:30 AM  Result Value Ref Range   WBC 12.6 (H) 4.0 - 10.5 K/uL   RBC 3.99 (L) 4.22 - 5.81 MIL/uL   Hemoglobin 12.6 (L) 13.0 - 17.0 g/dL   HCT 40.9 (L) 81.1 - 91.4 %  MCV 95.2 78.0 - 100.0 fL   MCH 31.6 26.0 - 34.0 pg   MCHC 33.2 30.0 - 36.0 g/dL   RDW 16.113.6 09.611.5 - 04.515.5 %   Platelets 221 150 - 400 K/uL   Neutrophils Relative % 59 %   Neutro Abs 7.4 1.7 - 7.7 K/uL   Lymphocytes Relative 28 %   Lymphs Abs 3.5 0.7 - 4.0 K/uL   Monocytes Relative 10 %   Monocytes Absolute 1.3 (H) 0.1 - 1.0 K/uL   Eosinophils Relative 3 %   Eosinophils Absolute 0.4 0.0 - 0.7 K/uL   Basophils Relative 0 %   Basophils Absolute 0.1 0.0 - 0.1 K/uL  TSH Once     Status: None   Collection Time: 02/04/16 10:06 AM   Result Value Ref Range   TSH 2.335 0.350 - 4.500 uIU/mL    Signed: Su Hoffarly J Byrd Rushlow, MD 02/04/2016, 1:08 PM    Services Ordered on Discharge: None Equipment Ordered on Discharge: None

## 2016-02-04 NOTE — Care Management Note (Signed)
Case Management Note  Patient Details  Name: Milagros LollJohn Reap MRN: [id number] Date of Birth: 1941-02-24  Subjective/Objective:    syncope               Action/Plan: Pt home with family. Can afford medications. No NCM needs identified.  PCP PHILLIPS Benson SettingJR, CHARLES W MD  Expected Discharge Date:                  Expected Discharge Plan:  Home/Self Care  In-House Referral:  NA  Discharge planning Services  CM Consult  Post Acute Care Choice:  NA Choice offered to:  NA  DME Arranged:  N/A DME Agency:  NA  HH Arranged:  NA HH Agency:  NA  Status of Service:  Completed, signed off  Medicare Important Message Given:    Date Medicare IM Given:    Medicare IM give by:    Date Additional Medicare IM Given:    Additional Medicare Important Message give by:     If discussed at Long Length of Stay Meetings, dates discussed:    Additional Comments:  Elliot CousinShavis, Taylore Hinde Ellen, RN 02/04/2016, 2:18 PM

## 2016-02-04 NOTE — Discharge Instructions (Signed)
It was a pleasure taking care of you, Mr. Jacob Barajas.  - You have a follow up appointment in our [facility]. April 27th at 3:15 with Dr. Vivianne Masterroung.   - We recommend that you stop driving until your memory loss can be further evaluated.

## 2016-02-08 LAB — CULTURE, BLOOD (ROUTINE X 2)
Culture: NO GROWTH
Culture: NO GROWTH

## 2016-02-13 ENCOUNTER — Ambulatory Visit: Payer: Medicare Other | Admitting: Internal Medicine

## 2016-02-25 NOTE — Progress Notes (Signed)
Physical Therapy Addendum for G-codes    02/03/16 0945  PT G-Codes **NOT FOR INPATIENT CLASS**  Functional Assessment Tool Used clinical judgement  Functional Limitation Mobility: Walking and moving around  Mobility: Walking and Moving Around Current Status (R6045(G8978) CI  Mobility: Walking and Moving Around Goal Status [phone](G8979) CI  Mobility: Walking and Moving Around Discharge Status [phone]) CI     02/25/2016 Veda CanningLynn Dallys Nowakowski, PT Pager: [phone]

## 2016-02-27 NOTE — Progress Notes (Signed)
OT Note - Addendum    02/03/16 1538  OT Visit Information  Last OT Received On 02/03/16  OT G-codes **NOT FOR INPATIENT CLASS**  Functional Assessment Tool Used clinical judgement  Functional Limitation Self care  Self Care Current Status (N8295(G8987) CI  Self Care Goal Status (A2130(G8988) CI  [facility] LLC[facility]ary Cane Dubray, OTR/L  [phone] 02/03/2016

## 2016-03-26 ENCOUNTER — Ambulatory Visit (HOSPITAL_BASED_OUTPATIENT_CLINIC_OR_DEPARTMENT_OTHER)
Admission: RE | Admit: 2016-03-26 | Discharge: 2016-03-26 | Disposition: A | Discharge status: Home / Self Care | Payer: Medicare Other | Source: Ambulatory Visit | Attending: Emergency Medicine | Admitting: Emergency Medicine

## 2016-03-26 ENCOUNTER — Emergency Department (HOSPITAL_COMMUNITY): Payer: Medicare Other

## 2016-03-26 ENCOUNTER — Encounter (HOSPITAL_COMMUNITY): Payer: Self-pay

## 2016-03-26 ENCOUNTER — Inpatient Hospital Stay (HOSPITAL_COMMUNITY)
Admission: EM | Admit: 2016-03-26 | Discharge: 2016-03-28 | DRG: 554 | Disposition: A | Discharge status: Home Health | Payer: Medicare Other | Attending: Internal Medicine | Admitting: Internal Medicine

## 2016-03-26 ENCOUNTER — Other Ambulatory Visit: Payer: Self-pay

## 2016-03-26 DIAGNOSIS — M79609 Pain in unspecified limb: Secondary | ICD-10-CM

## 2016-03-26 DIAGNOSIS — Z87891 Personal history of nicotine dependence: Secondary | ICD-10-CM

## 2016-03-26 DIAGNOSIS — R030 Elevated blood-pressure reading, without diagnosis of hypertension: Secondary | ICD-10-CM | POA: Diagnosis not present

## 2016-03-26 DIAGNOSIS — M25571 Pain in right ankle and joints of right foot: Secondary | ICD-10-CM | POA: Diagnosis not present

## 2016-03-26 DIAGNOSIS — M109 Gout, unspecified: Principal | ICD-10-CM | POA: Diagnosis present

## 2016-03-26 DIAGNOSIS — Z8249 Family history of ischemic heart disease and other diseases of the circulatory system: Secondary | ICD-10-CM

## 2016-03-26 DIAGNOSIS — M25561 Pain in right knee: Secondary | ICD-10-CM | POA: Diagnosis not present

## 2016-03-26 DIAGNOSIS — E869 Volume depletion, unspecified: Secondary | ICD-10-CM | POA: Diagnosis not present

## 2016-03-26 DIAGNOSIS — Z808 Family history of malignant neoplasm of other organs or systems: Secondary | ICD-10-CM

## 2016-03-26 DIAGNOSIS — E876 Hypokalemia: Secondary | ICD-10-CM

## 2016-03-26 DIAGNOSIS — M10061 Idiopathic gout, right knee: Secondary | ICD-10-CM | POA: Diagnosis not present

## 2016-03-26 LAB — CBC WITH DIFFERENTIAL/PLATELET
BASOS ABS: 0 10*3/uL (ref 0.0–0.1)
BASOS PCT: 0 %
EOS ABS: 0 10*3/uL (ref 0.0–0.7)
Eosinophils Relative: 0 %
HEMATOCRIT: 41.7 % (ref 39.0–52.0)
HEMOGLOBIN: 14.9 g/dL (ref 13.0–17.0)
Lymphocytes Relative: 16 %
Lymphs Abs: 2.4 10*3/uL (ref 0.7–4.0)
MCH: 32.4 pg (ref 26.0–34.0)
MCHC: 35.7 g/dL (ref 30.0–36.0)
MCV: 90.7 fL (ref 78.0–100.0)
MONOS PCT: 13 %
Monocytes Absolute: 1.9 10*3/uL — ABNORMAL HIGH (ref 0.1–1.0)
NEUTROS ABS: 10.2 10*3/uL — AB (ref 1.7–7.7)
NEUTROS PCT: 71 %
Platelets: 193 10*3/uL (ref 150–400)
RBC: 4.6 MIL/uL (ref 4.22–5.81)
RDW: 13.4 % (ref 11.5–15.5)
WBC: 14.6 10*3/uL — ABNORMAL HIGH (ref 4.0–10.5)

## 2016-03-26 LAB — BASIC METABOLIC PANEL
ANION GAP: 8 (ref 5–15)
BUN: 12 mg/dL (ref 6–20)
CALCIUM: 8.7 mg/dL — AB (ref 8.9–10.3)
CO2: 27 mmol/L (ref 22–32)
CREATININE: 1.04 mg/dL (ref 0.61–1.24)
Chloride: 101 mmol/L (ref 101–111)
GFR calc non Af Amer: >60 mL/min (ref >60)
Glucose, Bld: 125 mg/dL — ABNORMAL HIGH (ref 65–99)
Potassium: 3.4 mmol/L — ABNORMAL LOW (ref 3.5–5.1)
SODIUM: 136 mmol/L (ref 135–145)

## 2016-03-26 LAB — PROTEIN, SYNOVIAL FLUID: Protein, Synovial Fluid: 4.4 g/dL — ABNORMAL HIGH (ref 1.0–3.0)

## 2016-03-26 LAB — SYNOVIAL CELL COUNT + DIFF, W/ CRYSTALS
LYMPHOCYTES-SYNOVIAL FLD: 2 % (ref 0–20)
Monocyte-Macrophage-Synovial Fluid: 15 % — ABNORMAL LOW (ref 50–90)
NEUTROPHIL, SYNOVIAL: 83 % — AB (ref 0–25)
WBC, SYNOVIAL: 36960 /mm3 — AB (ref 0–200)

## 2016-03-26 LAB — MAGNESIUM: Magnesium: 2 mg/dL (ref 1.7–2.4)

## 2016-03-26 LAB — SEDIMENTATION RATE: SED RATE: 40 mm/h — AB (ref 0–16)

## 2016-03-26 LAB — GLUCOSE, SYNOVIAL FLUID: Glucose, Synovial Fluid: 78 mg/dL

## 2016-03-26 MED ORDER — LIDOCAINE-EPINEPHRINE (PF) 1 %-1:200000 IJ SOLN
INTRAMUSCULAR | Status: AC
  Administered 2016-03-26: 20 mL via INTRADERMAL
  Filled 2016-03-26: qty 30

## 2016-03-26 MED ORDER — COLCHICINE 0.6 MG PO TABS
0.6000 mg | ORAL_TABLET | Freq: Three times a day (TID) | ORAL | Status: DC
  Administered 2016-03-26 – 2016-03-27 (×2): 0.6 mg via ORAL
  Filled 2016-03-26 (×2): qty 1

## 2016-03-26 MED ORDER — MORPHINE SULFATE (PF) 2 MG/ML IV SOLN
2.0000 mg | Freq: Once | INTRAVENOUS | Status: AC
  Administered 2016-03-26: 2 mg via INTRAVENOUS
  Filled 2016-03-26: qty 1

## 2016-03-26 MED ORDER — COLCHICINE 0.6 MG PO TABS: 0.6000 mg | ORAL_TABLET | Freq: Three times a day (TID) | ORAL | Status: DC

## 2016-03-26 MED ORDER — LIDOCAINE-EPINEPHRINE (PF) 1 %-1:200000 IJ SOLN
20.0000 mL | Freq: Once | INTRAMUSCULAR | Status: AC
  Administered 2016-03-26: 20 mL via INTRADERMAL

## 2016-03-26 MED ORDER — ONDANSETRON HCL 4 MG/2ML IJ SOLN
4.0000 mg | Freq: Once | INTRAMUSCULAR | Status: AC
  Administered 2016-03-26: 4 mg via INTRAVENOUS
  Filled 2016-03-26: qty 2

## 2016-03-26 MED ORDER — SODIUM CHLORIDE 0.45 % IV SOLN
INTRAVENOUS | Status: DC
  Administered 2016-03-26 – 2016-03-28 (×3): via INTRAVENOUS

## 2016-03-26 MED ORDER — PANTOPRAZOLE SODIUM 40 MG PO TBEC
40.0000 mg | DELAYED_RELEASE_TABLET | Freq: Two times a day (BID) | ORAL | Status: DC
  Administered 2016-03-26 – 2016-03-28 (×4): 40 mg via ORAL
  Filled 2016-03-26 (×4): qty 1

## 2016-03-26 MED ORDER — IBUPROFEN 200 MG PO TABS
200.0000 mg | ORAL_TABLET | Freq: Four times a day (QID) | ORAL | Status: DC | PRN
  Administered 2016-03-27 – 2016-03-28 (×4): 200 mg via ORAL
  Filled 2016-03-26 (×4): qty 1

## 2016-03-26 MED ORDER — COLCHICINE 0.6 MG PO TABS
0.6000 mg | ORAL_TABLET | Freq: Once | ORAL | Status: AC
  Administered 2016-03-26: 0.6 mg via ORAL
  Filled 2016-03-26: qty 1

## 2016-03-26 MED ORDER — DIPHENHYDRAMINE HCL 25 MG PO CAPS
25.0000 mg | ORAL_CAPSULE | Freq: Once | ORAL | Status: AC
  Administered 2016-03-26: 25 mg via ORAL
  Filled 2016-03-26: qty 1

## 2016-03-26 MED ORDER — HYDRALAZINE HCL 25 MG PO TABS
25.0000 mg | ORAL_TABLET | ORAL | Status: DC | PRN
  Administered 2016-03-26: 25 mg via ORAL
  Filled 2016-03-26: qty 1

## 2016-03-26 MED ORDER — COLCHICINE 0.6 MG PO TABS
0.6000 mg | ORAL_TABLET | Freq: Three times a day (TID) | ORAL | Status: DC
  Filled 2016-03-26 (×2): qty 1

## 2016-03-26 MED ORDER — MORPHINE SULFATE (PF) 4 MG/ML IV SOLN
4.0000 mg | Freq: Once | INTRAVENOUS | Status: AC
  Administered 2016-03-26: 4 mg via INTRAVENOUS
  Filled 2016-03-26: qty 1

## 2016-03-26 MED ORDER — COLCHICINE 0.6 MG PO TABS
1.2000 mg | ORAL_TABLET | Freq: Once | ORAL | Status: AC
  Administered 2016-03-26: 1.2 mg via ORAL
  Filled 2016-03-26: qty 2

## 2016-03-26 MED ORDER — POTASSIUM CHLORIDE 20 MEQ PO PACK: 40.0000 meq | PACK | Freq: Two times a day (BID) | ORAL | Status: DC

## 2016-03-26 MED ORDER — LIDOCAINE-EPINEPHRINE 1 %-1:100000 IJ SOLN
20.0000 mL | Freq: Once | INTRAMUSCULAR | Status: DC
Start: 2016-03-26 — End: 2016-03-26

## 2016-03-26 MED ORDER — ENOXAPARIN SODIUM 40 MG/0.4ML ~~LOC~~ SOLN
40.0000 mg | SUBCUTANEOUS | Status: DC
  Administered 2016-03-26 – 2016-03-27 (×2): 40 mg via SUBCUTANEOUS
  Filled 2016-03-26 (×2): qty 0.4

## 2016-03-26 MED ORDER — POTASSIUM CHLORIDE CRYS ER 20 MEQ PO TBCR
40.0000 meq | EXTENDED_RELEASE_TABLET | Freq: Two times a day (BID) | ORAL | Status: AC
  Administered 2016-03-26 (×2): 40 meq via ORAL
  Filled 2016-03-26 (×2): qty 2

## 2016-03-26 NOTE — ED Notes (Addendum)
Pt unable to bend right leg without feeling severe pain, pt unable to ambulate at this time.

## 2016-03-26 NOTE — ED Provider Notes (Signed)
CSN: [id number]     Arrival date & time 03/26/16  1027 History   First MD Initiated Contact with Patient 03/26/16 1048     Chief Complaint  Patient presents with  . Emesis  . Gout     (Consider location/radiation/quality/duration/timing/severity/associated sxs/prior Treatment) Patient is a 75 y.o. male presenting with vomiting and knee pain. The history is provided by the patient.  Emesis Associated symptoms: arthralgias and myalgias   Associated symptoms: no abdominal pain, no chills, no diarrhea and no headaches   Knee Pain Location:  Knee Time since incident:  1 week Injury: no   Knee location:  R knee Pain details:    Quality:  Sharp and shooting   Radiates to:  Does not radiate   Severity:  Severe   Onset quality:  Sudden   Duration:  1 week   Timing:  Constant   Progression:  Worsening Chronicity:  Recurrent Foreign body present:  No foreign bodies Relieved by:  Nothing Worsened by:  Bearing weight, extension and flexion Ineffective treatments:  None tried Associated symptoms: no fever    75 yo M With a chief complaints of right leg pain. This been going on for about a week. Patient is unable to get up and move. This by himself and has been stuck in the bed for about a week. Family is now unable to transfer him in the bed. He has been incontinent of urine stool since this. He thinks this feels like his gout. Has been taking some medicines that he's not sure they are from a neighbor. Having no relief with. Denies fevers or chills.  Past Medical History  Diagnosis Date  . Gout    Past Surgical History  Procedure Laterality Date  . Skin graft Right    Family History  Problem Relation Age of Onset  . Heart disease Brother   . Cancer Sister     "throat" cancer   Social History  Substance Use Topics  . Smoking status: Former Games developer  . Smokeless tobacco: None  . Alcohol Use: No    Review of Systems  Constitutional: Negative for fever and chills.  HENT:  Negative for congestion and facial swelling.   Eyes: Negative for discharge and visual disturbance.  Respiratory: Negative for shortness of breath.   Cardiovascular: Negative for chest pain and palpitations.  Gastrointestinal: Negative for vomiting, abdominal pain and diarrhea.  Musculoskeletal: Positive for myalgias and arthralgias.  Skin: Negative for color change and rash.  Neurological: Negative for tremors, syncope and headaches.  Psychiatric/Behavioral: Negative for confusion and dysphoric mood.      Allergies  Review of patient's allergies indicates no known allergies.  Home Medications   Prior to Admission medications   Medication Sig Start Date End Date Taking? Authorizing Provider  ibuprofen (ADVIL,MOTRIN) 200 MG tablet Take 400 mg by mouth every 4 (four) hours as needed for moderate pain.   Yes Historical Provider, MD  Misc Natural Products (TART CHERRY ADVANCED PO) Take 1 tablet by mouth daily.   Yes Historical Provider, MD   BP 168/88 mmHg  Pulse 92  Temp(Src) 97.1 F (36.2 C) (Oral)  Resp 19  SpO2 94% Physical Exam  Constitutional: He is oriented to person, place, and time. He appears well-developed and well-nourished.  HENT:  Head: Normocephalic and atraumatic.  Eyes: EOM are normal. Pupils are equal, round, and reactive to light.  Neck: Normal range of motion. Neck supple. No JVD present.  Cardiovascular: Normal rate and regular rhythm.  Exam  reveals no gallop and no friction rub.   No murmur heard. Pulmonary/Chest: No respiratory distress. He has no wheezes.  Abdominal: He exhibits no distension. There is no tenderness. There is no rebound and no guarding.  Musculoskeletal: Normal range of motion. He exhibits edema and tenderness.  Significant tenderness and edema to the right knee and ankle. No noted erythema. Severe pain with range of motion.  Neurological: He is alert and oriented to person, place, and time.  Skin: No rash noted. No pallor.  Psychiatric:  He has a normal mood and affect. His behavior is normal.  Nursing note and vitals reviewed.   ED Course  .Joint Aspiration/Arthrocentesis Date/Time: 03/26/2016 2:24 PM Performed by: Melene PlanFLOYD, Treyton Slimp Authorized by: Melene PlanFLOYD, Reyansh Kushnir Consent: Verbal consent obtained. Risks and benefits: risks, benefits and alternatives were discussed Consent given by: patient Required items: required blood products, implants, devices, and special equipment available Patient identity confirmed: verbally with patient Time out: Immediately prior to procedure a "time out" was called to verify the correct patient, procedure, equipment, support staff and site/side marked as required. Indications: possible septic joint,  diagnostic evaluation,  pain and joint swelling  Body area: knee Joint: right knee Local anesthesia used: yes Anesthesia: local infiltration Local anesthetic: lidocaine 1% with epinephrine Anesthetic total: 2 ml Patient sedated: no Preparation: Patient was prepped and draped in the usual sterile fashion. Needle gauge: 18 G Ultrasound guidance: no Approach: lateral Aspirate: serous Aspirate amount: 30 mL Lidocaine 1% amount: 10 ml Patient tolerance: Patient tolerated the procedure well with no immediate complications   (including critical care time) Labs Review Labs Reviewed  CBC WITH DIFFERENTIAL/PLATELET - Abnormal; Notable for the following:    WBC 14.6 (*)    Neutro Abs 10.2 (*)    Monocytes Absolute 1.9 (*)    All other components within normal limits  BASIC METABOLIC PANEL - Abnormal; Notable for the following:    Potassium 3.4 (*)    Glucose, Bld 125 (*)    Calcium 8.7 (*)    All other components within normal limits  SEDIMENTATION RATE - Abnormal; Notable for the following:    Sed Rate 40 (*)    All other components within normal limits  SYNOVIAL CELL COUNT + DIFF, W/ CRYSTALS - Abnormal; Notable for the following:    Appearance-Synovial TURBID (*)    WBC, Synovial [id number] (*)     Neutrophil, Synovial 83 (*)    Monocyte-Macrophage-Synovial Fluid 15 (*)    All other components within normal limits  BODY FLUID CULTURE  C-REACTIVE PROTEIN  GLUCOSE, SYNOVIAL FLUID  PROTEIN, SYNOVIAL FLUID    Imaging Review Dg Ankle Complete Right  03/26/2016  CLINICAL DATA:  Knee and ankle pain. EXAM: RIGHT ANKLE - COMPLETE 3+ VIEW COMPARISON:  None. FINDINGS: Osseous alignment is normal. Bone mineralization is normal. Spurring and/or small old avulsion fracture fragments along the inferior margin of the medial malleolus, likely related to previous injury. No acute-appearing fracture line or displaced fracture fragment. Ankle mortise is symmetric. No evidence of significant degenerative change. Extensive atherosclerotic calcifications noted within the soft tissues at the level of the right ankle and distal calf. Atherosclerotic calcifications extend over the dorsum of the foot. Surrounding soft tissues are otherwise unremarkable. IMPRESSION: 1. No acute findings. 2. Chronic spurring and/or small old avulsion fracture fragments along the inferior margin of the medial malleolus. 3. Fairly extensive atherosclerosis. Electronically Signed   By: Bary RichardStan  Maynard M.D.   On: 03/26/2016 13:17   Dg Knee Complete 4 Views  Right  03/26/2016  CLINICAL DATA:  Right knee pain and right ankle pain. EXAM: RIGHT KNEE - COMPLETE 4+ VIEW COMPARISON:  None. FINDINGS: Osseous alignment is normal. Bone mineralization is normal. No acute or suspicious osseous lesion. No fracture line or displaced fracture fragment. No definite joint effusion. Atherosclerotic calcifications within the popliteal fossa. IMPRESSION: 1. No acute findings. 2. No evidence of significant DJD. 3. Atherosclerotic calcifications within the popliteal fossa. Electronically Signed   By: Bary Richard M.D.   On: 03/26/2016 13:06   I have personally reviewed and evaluated these images and lab results as part of my medical decision-making.   EKG  Interpretation None      MDM   Final diagnoses:  Acute gout of right knee, unspecified cause    75 yo M with a chief complaints of right knee pain. Patient has significant swelling to the right knee. Severe pain with range of motion. Patient is unable to move this even the slightest. Significant swelling right greater than left. Will obtain a DVT study. X-ray of the right knee. Arthrocentesis.  Arthrocentesis consistent with gout. Patient still having significant tenderness despite 10 mg of morphine and colchicine treatment will admit.  The patients results and plan were reviewed and discussed.   Any x-rays performed were independently reviewed by myself.   Differential diagnosis were considered with the presenting HPI.  Medications  colchicine tablet 1.2 mg (1.2 mg Oral Given 03/26/16 1216)  morphine 2 MG/ML injection 2 mg (2 mg Intravenous Given 03/26/16 1119)  ondansetron (ZOFRAN) injection 4 mg (4 mg Intravenous Given 03/26/16 1119)  lidocaine-EPINEPHrine (XYLOCAINE-EPINEPHrine) 1 %-1:200000 (PF) injection 20 mL (20 mLs Intradermal Given 03/26/16 1218)  colchicine tablet 0.6 mg (0.6 mg Oral Given 03/26/16 1251)  morphine 4 MG/ML injection 4 mg (4 mg Intravenous Given 03/26/16 1251)  ondansetron (ZOFRAN) injection 4 mg (4 mg Intravenous Given 03/26/16 1251)  morphine 4 MG/ML injection 4 mg (4 mg Intravenous Given 03/26/16 1332)    Filed Vitals:   03/26/16 1104 03/26/16 1259  BP: 156/98 168/88  Pulse: 90 92  Temp: 97.1 F (36.2 C)   TempSrc: Oral   Resp: 18 19  SpO2: 95% 94%    Final diagnoses:  Acute gout of right knee, unspecified cause    Admission/ observation were discussed with the admitting physician, patient and/or family and they are comfortable with the plan.    Melene Plan, DO 03/26/16 1426

## 2016-03-26 NOTE — Progress Notes (Signed)
VASCULAR LAB PRELIMINARY  PRELIMINARY  PRELIMINARY  PRELIMINARY  Right lower extremity venous duplex completed.    Right:  No evidence of DVT, superficial thrombosis, or Baker's cyst.  Jacob DrownGave Matt, RN the results of this patient@11 :50 am.  Jacob Barajas, RVT, RDMS 03/26/2016, 11:49 AM

## 2016-03-26 NOTE — ED Notes (Addendum)
Pt from home.  Over the last week his gout pain has increased to the point of needing transfer assistance.  Family hasn't been able to move him in last few days so he's been incontinent on self.  Per EMS he was A&Ox4, talking, making jokes.  When he was being registered, EMS noted that he started staring off in space, eyes rolled back in his head, and she gave him a sternal rub, he came back to and vomited.  Takes "gout" pills but EMS thinks they are pain pills from their neighbor b/c he has no PMD.   VS by EMS: 98% RA, 88, 162/82, 18.   Pt had large incontinent episode of diarrhea on arrival.

## 2016-03-26 NOTE — H&P (Signed)
History and Physical  Abdulraheem Pineo ZOX:[id number] DOB: 08-16-1941 DOA: 03/26/2016  Referring physician: ER physician PCP: No PCP Per Patient  Outpatient Specialists:    Patient coming from: Home  Chief Complaint: Right knee swelling and pain  HPI: 75 year old male with history of gout. Patient tells me that he has been having pain and swelling of the right knee for up to 6 months. The pain and swelling got worse leading to current admission. Thoracentesis has not revealed any organisms, but revealed 81191 WBC. No headache, no neck pain, no chest pain, no SOB, no GI symptoms and no urinary symptoms.  ED Course: Thoracentesis and pain control  Pertinent labs: Preliminary thoracentesis result and potassium of 3.4. WBC is 14.6.  Review of Systems: As in HPI. 12 systems were reviewed. Negative for fever, visual changes, sore throat, rash, new muscle aches, chest pain, SOB, dysuria, bleeding, n/v/abdominal pain.  Past Medical History  Diagnosis Date  . Gout     Past Surgical History  Procedure Laterality Date  . Skin graft Right      reports that he has quit smoking. He has never used smokeless tobacco. He reports that he does not drink alcohol. His drug history is not on file.  No Known Allergies  Family History  Problem Relation Age of Onset  . Heart disease Brother   . Cancer Sister     "throat" cancer     Prior to Admission medications   Medication Sig Start Date End Date Taking? Authorizing Provider  ibuprofen (ADVIL,MOTRIN) 200 MG tablet Take 400 mg by mouth every 4 (four) hours as needed for moderate pain.   Yes Historical Provider, MD  Misc Natural Products (TART CHERRY ADVANCED PO) Take 1 tablet by mouth daily.   Yes Historical Provider, MD    Physical Exam: Filed Vitals:   03/26/16 1104 03/26/16 1259 03/26/16 1453  BP: 156/98 168/88 192/98  Pulse: 90 92 99  Temp: 97.1 F (36.2 C)  97.5 F (36.4 C)  TempSrc: Oral  Oral  Resp: 18 19 19   SpO2: 95% 94% 96%     Constitutional:  . Appears calm and comfortable Eyes:  PERRL  ENMT:  . external ears, nose appear normal . Dry buccal mucosa Neck:  . neck appears normal, no masses, normal ROM, supple  Respiratory:  . CTA bilaterally, no w/r/r.  . Respiratory effort normal. No retractions or accessory muscle use Cardiovascular:  . RRR, no m/r/g . No LE extremity edema   . Normal pedal pulses Abdomen:  . Abdomen appears normal; no tenderness or masses Musculoskeletal:  . Swelling of the right knee joint Neurologic:  . AAO X 3. Moves all limbs.  Wt Readings from Last 3 Encounters:  02/04/16 75.932 kg (167 lb 6.4 oz)  10/15/15 83.915 kg (185 lb)    I have personally reviewed following labs and imaging studies  Labs on Admission:  CBC:  Recent Labs Lab 03/26/16 1107  WBC 14.6*  NEUTROABS 10.2*  HGB 14.9  HCT 41.7  MCV 90.7  PLT 193   Basic Metabolic Panel:  Recent Labs Lab 03/26/16 1107  NA 136  K 3.4*  CL 101  CO2 27  GLUCOSE 125*  BUN 12  CREATININE 1.04  CALCIUM 8.7*   Liver Function Tests: No results for input(s): AST, ALT, ALKPHOS, BILITOT, PROT, ALBUMIN in the last 168 hours. No results for input(s): LIPASE, AMYLASE in the last 168 hours. No results for input(s): AMMONIA in the last 168 hours. Coagulation  Profile: No results for input(s): INR, PROTIME in the last 168 hours. Cardiac Enzymes: No results for input(s): CKTOTAL, CKMB, CKMBINDEX, TROPONINI in the last 168 hours. BNP (last 3 results) No results for input(s): PROBNP in the last 8760 hours. HbA1C: No results for input(s): HGBA1C in the last 72 hours. CBG: No results for input(s): GLUCAP in the last 168 hours. Lipid Profile: No results for input(s): CHOL, HDL, LDLCALC, TRIG, CHOLHDL, LDLDIRECT in the last 72 hours. Thyroid Function Tests: No results for input(s): TSH, T4TOTAL, FREET4, T3FREE, THYROIDAB in the last 72 hours. Anemia Panel: No results for input(s): VITAMINB12, FOLATE, FERRITIN,  TIBC, IRON, RETICCTPCT in the last 72 hours. Urine analysis:    Component Value Date/Time   COLORURINE AMBER* 02/02/2016 1523   APPEARANCEUR CLEAR 02/02/2016 1523   LABSPEC 1.039* 02/02/2016 1523   PHURINE 5.5 02/02/2016 1523   GLUCOSEU NEGATIVE 02/02/2016 1523   HGBUR NEGATIVE 02/02/2016 1523   BILIRUBINUR SMALL* 02/02/2016 1523   KETONESUR 15* 02/02/2016 1523   PROTEINUR 30* 02/02/2016 1523   NITRITE NEGATIVE 02/02/2016 1523   LEUKOCYTESUR NEGATIVE 02/02/2016 1523   Sepsis Labs: (procalcitonin:4,lacticidven:4) ) Recent Results (from the past 240 hour(s))  Body fluid culture     Status: None (Preliminary result)   Collection Time: 03/26/16 12:09 PM  Result Value Ref Range Status   Specimen Description SYNOVIAL RIGHT KNEE  Final   Special Requests Normal  Final   Gram Stain   Final    CYTOSPIN WBC PRESENT, PREDOMINANTLY PMN NO ORGANISMS SEEN Gram Stain Report Called to,Read Back By and Verified With: C. HALL RN AT 1255 ON 06.08.17 BY SHUEA    Culture PENDING  Incomplete   Report Status PENDING  Incomplete      Radiological Exams on Admission: Dg Ankle Complete Right  03/26/2016  CLINICAL DATA:  Knee and ankle pain. EXAM: RIGHT ANKLE - COMPLETE 3+ VIEW COMPARISON:  None. FINDINGS: Osseous alignment is normal. Bone mineralization is normal. Spurring and/or small old avulsion fracture fragments along the inferior margin of the medial malleolus, likely related to previous injury. No acute-appearing fracture line or displaced fracture fragment. Ankle mortise is symmetric. No evidence of significant degenerative change. Extensive atherosclerotic calcifications noted within the soft tissues at the level of the right ankle and distal calf. Atherosclerotic calcifications extend over the dorsum of the foot. Surrounding soft tissues are otherwise unremarkable. IMPRESSION: 1. No acute findings. 2. Chronic spurring and/or small old avulsion fracture fragments along the inferior  margin of the medial malleolus. 3. Fairly extensive atherosclerosis. Electronically Signed   By: Bary Richard M.D.   On: 03/26/2016 13:17   Dg Knee Complete 4 Views Right  03/26/2016  CLINICAL DATA:  Right knee pain and right ankle pain. EXAM: RIGHT KNEE - COMPLETE 4+ VIEW COMPARISON:  None. FINDINGS: Osseous alignment is normal. Bone mineralization is normal. No acute or suspicious osseous lesion. No fracture line or displaced fracture fragment. No definite joint effusion. Atherosclerotic calcifications within the popliteal fossa. IMPRESSION: 1. No acute findings. 2. No evidence of significant DJD. 3. Atherosclerotic calcifications within the popliteal fossa. Electronically Signed   By: Bary Richard M.D.   On: 03/26/2016 13:06   Active Problems:   Gout   Acute gouty arthritis   Assessment/Plan 1. Acute gouty arthritis right knee 2. Elevated Blood pressure - May be related to pain 3. Volume depletion 4. Hypokalemia   Admit patient for further assessment and management  Start Colchicine.  Start PRN NSAIDs  Oral protonix for GI  prophylaxis  Hydralazine PRN. BP may improve with pain control.  Follow right knee aspirate analysis  Replete potassium  Hydrate patient  Monitor renal function and electrolytes  DVT prophylaxis: Lovenox Code Status:  Full Family Communication:  Grandson Disposition Plan:  Home eventually   Consults called:  None. Low threshold to consult Ortho for possible intra-articular steroid if no improvement is noted   Admission status:  Inpatient     Time spent: 60 minutes  Berton MountSylvester Lanitra Battaglini, MD  [facility] Pager #:  7PM-7AM contact night coverage as above  03/26/2016, 4:18 PM

## 2016-03-26 NOTE — ED Notes (Signed)
Bed: ZO10WA25 Expected date:  Expected time:  Means of arrival:  Comments: 75 yo male, gout pain

## 2016-03-27 LAB — RENAL FUNCTION PANEL
Albumin: 3.7 g/dL (ref 3.5–5.0)
Anion gap: 7 (ref 5–15)
BUN: 19 mg/dL (ref 6–20)
CO2: 28 mmol/L (ref 22–32)
Calcium: 8.5 mg/dL — ABNORMAL LOW (ref 8.9–10.3)
Chloride: 102 mmol/L (ref 101–111)
Creatinine, Ser: 1.17 mg/dL (ref 0.61–1.24)
GFR calc Af Amer: >60 mL/min (ref >60)
GFR calc non Af Amer: 59 mL/min — ABNORMAL LOW (ref >60)
Glucose, Bld: 110 mg/dL — ABNORMAL HIGH (ref 65–99)
Phosphorus: 3.4 mg/dL (ref 2.5–4.6)
Potassium: 4 mmol/L (ref 3.5–5.1)
Sodium: 137 mmol/L (ref 135–145)

## 2016-03-27 LAB — C-REACTIVE PROTEIN: CRP: 19.2 mg/dL — ABNORMAL HIGH (ref <1.0)

## 2016-03-27 LAB — URIC ACID: URIC ACID, SERUM: 4 mg/dL — AB (ref 4.4–7.6)

## 2016-03-27 MED ORDER — PREDNISONE 20 MG PO TABS: 40.0000 mg | ORAL_TABLET | Freq: Every day | ORAL | Status: DC

## 2016-03-27 MED ORDER — METHYLPREDNISOLONE SODIUM SUCC 125 MG IJ SOLR
60.0000 mg | Freq: Once | INTRAMUSCULAR | Status: AC
  Administered 2016-03-27: 60 mg via INTRAVENOUS
  Filled 2016-03-27: qty 2

## 2016-03-27 MED ORDER — PREDNISONE 50 MG PO TABS
50.0000 mg | ORAL_TABLET | Freq: Every day | ORAL | Status: DC
  Administered 2016-03-28: 50 mg via ORAL
  Filled 2016-03-27: qty 1

## 2016-03-27 MED ORDER — COLCHICINE 0.6 MG PO TABS
0.6000 mg | ORAL_TABLET | Freq: Two times a day (BID) | ORAL | Status: DC
  Administered 2016-03-27 – 2016-03-28 (×2): 0.6 mg via ORAL
  Filled 2016-03-27 (×2): qty 1

## 2016-03-27 NOTE — Progress Notes (Signed)
PROGRESS NOTE  Jacob LollJohn Barajas RUE:[id number] DOB: [birth year] DOA: 03/26/2016 PCP: No PCP Per Patient  HPI/Recap of past 24 hours:  Feeling better, no pain at rest, still with pain with minimal movement  Assessment/Plan: Active Problems:   Gout   Acute gouty arthritis  Acute gouty arthritis right knee, s/p arthrocentesis in the ED, synovial fluids study with [id number] wbc, + monosodium urate crystals, gram stain no organism, culture no  Growth so far. Check serum uric acid, Continue colchicine ( does reduced to bid), add steroids. Continue hydration for another 24hrs, Start PT.  Code Status: full  Family Communication: patient   Disposition Plan: home   Consultants:  none  Procedures:  Arthrocentesis on 6/8  Antibiotics:  none   Objective: BP 141/82 mmHg  Pulse 75  Temp(Src) 98.1 F (36.7 C) (Oral)  Resp 18  Ht 5\' 10"  (1.778 m)  Wt   SpO2 97%  Intake/Output Summary (Last 24 hours) at 03/27/16 1536 Last data filed at 03/27/16 1455  Gross per 24 hour  Intake 471.67 ml  Output    475 ml  Net  -3.33 ml   Filed Weights    Exam:   General:  NAD  Cardiovascular: RRR  Respiratory: CTABL  Abdomen: Soft/ND/NT, positive BS  Musculoskeletal: right knee mild swelling, warm and tender to touch, range of motion limited due to pain  Neuro: aaox3  Data Reviewed: Basic Metabolic Panel:  Recent Labs Lab 03/26/16 1107 03/27/16 0452  NA 136 137  K 3.4* 4.0  CL 101 102  CO2 27 28  GLUCOSE 125* 110*  BUN 12 19  CREATININE 1.04 1.17  CALCIUM 8.7* 8.5*  MG 2.0  --   PHOS  --  3.4   Liver Function Tests:  Recent Labs Lab 03/27/16 0452  ALBUMIN 3.7   No results for input(s): LIPASE, AMYLASE in the last 168 hours. No results for input(s): AMMONIA in the last 168 hours. CBC:  Recent Labs Lab 03/26/16 1107  WBC 14.6*  NEUTROABS 10.2*  HGB 14.9  HCT 41.7  MCV 90.7  PLT 193   Cardiac Enzymes:   No results for input(s): CKTOTAL, CKMB, CKMBINDEX,  TROPONINI in the last 168 hours. BNP (last 3 results) No results for input(s): BNP in the last 8760 hours.  ProBNP (last 3 results) No results for input(s): PROBNP in the last 8760 hours.  CBG: No results for input(s): GLUCAP in the last 168 hours.  Recent Results (from the past 240 hour(s))  Body fluid culture     Status: None (Preliminary result)   Collection Time: 03/26/16 12:09 PM  Result Value Ref Range Status   Specimen Description SYNOVIAL RIGHT KNEE  Final   Special Requests Normal  Final   Gram Stain   Final    CYTOSPIN WBC PRESENT, PREDOMINANTLY PMN NO ORGANISMS SEEN Gram Stain Report Called to,Read Back By and Verified With: C. HALL RN AT 1255 ON 06.08.17 BY SHUEA    Culture   Final    NO GROWTH < 24 HOURS [facility]    Report Status PENDING  Incomplete     Studies: No results found.  Scheduled Meds: . colchicine  0.6 mg Oral BID  . enoxaparin (LOVENOX) injection  40 mg Subcutaneous Q24H  . methylPREDNISolone (SOLU-MEDROL) injection  60 mg Intravenous Once  . pantoprazole  40 mg Oral BID  . [START ON 03/29/2016] predniSONE  40 mg Oral Q breakfast  . [START ON 03/28/2016] predniSONE  50 mg  Oral Q breakfast    Continuous Infusions: . sodium chloride 50 mL/hr at 03/26/16 2034     Time spent:  Jacob Hurlbutt MD, PhD  [facility] Pager [phone]. If 7PM-7AM, please contact night-coverage at [email or web address], password [email or web address] 03/27/2016, 3:36 PM  LOS: 1 day

## 2016-03-28 DIAGNOSIS — M109 Gout, unspecified: Principal | ICD-10-CM

## 2016-03-28 LAB — BASIC METABOLIC PANEL
Anion gap: 9 (ref 5–15)
BUN: 26 mg/dL — AB (ref 6–20)
CALCIUM: 8.6 mg/dL — AB (ref 8.9–10.3)
CO2: 26 mmol/L (ref 22–32)
CREATININE: 1.22 mg/dL (ref 0.61–1.24)
Chloride: 101 mmol/L (ref 101–111)
GFR calc Af Amer: >60 mL/min (ref >60)
GFR, EST NON AFRICAN AMERICAN: 56 mL/min — AB (ref >60)
GLUCOSE: 161 mg/dL — AB (ref 65–99)
Potassium: 3.9 mmol/L (ref 3.5–5.1)
Sodium: 136 mmol/L (ref 135–145)

## 2016-03-28 LAB — CBC
HCT: 40.5 % (ref 39.0–52.0)
Hemoglobin: 13.9 g/dL (ref 13.0–17.0)
MCH: 32 pg (ref 26.0–34.0)
MCHC: 34.3 g/dL (ref 30.0–36.0)
MCV: 93.3 fL (ref 78.0–100.0)
PLATELETS: 241 10*3/uL (ref 150–400)
RBC: 4.34 MIL/uL (ref 4.22–5.81)
RDW: 13.2 % (ref 11.5–15.5)
WBC: 9.3 10*3/uL (ref 4.0–10.5)

## 2016-03-28 MED ORDER — COLCHICINE 0.6 MG PO TABS: 0.6000 mg | ORAL_TABLET | Freq: Two times a day (BID) | ORAL | Status: DC

## 2016-03-28 MED ORDER — PREDNISONE 20 MG PO TABS: 40.0000 mg | ORAL_TABLET | Freq: Every day | ORAL | Status: DC

## 2016-03-28 NOTE — Care Management Note (Signed)
Case Management Note  Patient Details  Name: Jacob Barajas MRN: [id number] Date of Birth: 11/16/1940  Subjective/Objective:   Acute gouty arthritis                 Action/Plan: Discharge Planning: AVS reviewed:  NCM spoke to pt at bedside. Offered choice for HH/provided HH List. [facility] Pt states he does not know name of PCP. He going to look for a new PCP on Monday. NCM will assist with getting pt a PCP in his area. [facility]. Contacted [facility] DME rep for RW for home.    Expected Discharge Date:  03/28/2016             Expected Discharge Plan:  Home w Home Health Services  In-House Referral:  NA  Discharge planning Services  CM Consult  Post Acute Care Choice:  Home Health Choice offered to:  Patient  DME Arranged:  [place] rolling DME Agency:  [facility]  HH Arranged:  RN, Nurse's Aide HH Agency:  [facility]  Status of Service:  Completed, signed off  Medicare Important Message Given:    Date Medicare IM Given:    Medicare IM give by:    Date Additional Medicare IM Given:    Additional Medicare Important Message give by:     If discussed at Long Length of Stay Meetings, dates discussed:    Additional Comments:  Elliot CousinShavis, Jovanna Hodges Ellen, RN 03/28/2016, 3:09 PM

## 2016-03-28 NOTE — Evaluation (Signed)
Physical Therapy Evaluation Patient Details Name: Jacob Barajas MRN: [id number] DOB: 14-Jan-1941 Today's Date: 03/28/2016   History of Present Illness  Pt admitted to the hospital with an episode of pain and swelling in his knee for about 6 months.  He has a diagnosis of gout and had an aspiration of knee in the ED   Clinical Impression  Jacob Barajas will benefit from continued PT for right knee exercise for range of motion and strengthening, strethching to hamstrings along with gait and functional mobility trainig due to syrtoms from long standing gout and deconditioning of RLE  If caregiver will be with him and can provide basic assist he should impove with HHPT. IF caregiver not available, recommend short term SNF     Follow Up Recommendations Home health PT    Equipment Recommendations  Rolling walker with 5" wheels    Recommendations for Other Services OT consult     Precautions / Restrictions Restrictions Weight Bearing Restrictions: No      Mobility  Bed Mobility Overal bed mobility: Needs Assistance Bed Mobility: Supine to Sit           General bed mobility comments: diffiuclty with initial movment as pt is "stiff" and he has not been out of bed since admission.  He appeared to move more fluidly as session progressed   Transfers Overall transfer level: Needs assistance Equipment used: Rolling walker (2 wheeled)             General transfer comment: pt needs most assist lifting hips to initiate sit to stand Once his weight is moved forward he is able to stand erect   Ambulation/Gait Ambulation/Gait assistance: Min assist Ambulation Distance (Feet): 30 Feet Assistive device: Rolling walker (2 wheeled) Gait Pattern/deviations: Step-to pattern;Decreased step length - right;Decreased step length - left;Decreased weight shift to right;Narrow base of support Gait velocity:  (slow)   General Gait Details: pt has some diffciulty at first with weight shift to right but  is able to improve the more he walks.   Stairs            Wheelchair Mobility    Modified Rankin (Stroke Patients Only)       Balance Overall balance assessment: No apparent balance deficits (not formally assessed)                                           Pertinent Vitals/Pain Pain Assessment: No/denies pain    Home Living Family/patient expects to be discharged to:: Private residence Living Arrangements: Alone;Other (Comment) (friend stays with pt occasionally and may be there now) Available Help at Discharge: Friend(s) Type of Home: House Home Access: Ramped entrance     Home Layout: One level Home Equipment: Walker - 2 wheels;Grab bars - toilet (walker at home is in disrepair and needs replaced)      Prior Function Level of Independence: Independent         Comments: still works as a a Dance movement psychotherapist: Right    Extremity/Trunk Assessment   Upper Extremity Assessment: Overall WFL for tasks assessed           Lower Extremity Assessment: RLE deficits/detail RLE Deficits / Details: knee is swollen with bandaid over lateral aspect.  Pt lacks about 20 to full extension and has about 95 degress of knee flexion.  He  has decreased quad and hamstring strength to 3-/5  He has tightness in hamstrings with c/o aching in knee when legs are up in recliner. pt states his right foot feels better. No visible swelling or redness  LLE Deficits / Details:  (no problem with left leg )     Communication   Communication: HOH  Cognition Arousal/Alertness: Awake/alert Behavior During Therapy: WFL for tasks assessed/performed Overall Cognitive Status: Within Functional Limits for tasks assessed                      General Comments General comments (skin integrity, edema, etc.): swellng and decreased range of motion in right knee    Exercises        Assessment/Plan    PT Assessment Patient needs continued  PT services  PT Diagnosis Difficulty walking;Abnormality of gait   PT Problem List Decreased strength;Decreased range of motion;Decreased activity tolerance  PT Treatment Interventions DME instruction;Gait training;Stair training;Therapeutic activities;Therapeutic exercise;Patient/family education   PT Goals (Current goals can be found in the Care Plan section) Acute Rehab PT Goals Patient Stated Goal: to go home  PT Goal Formulation: With patient Time For Goal Achievement: 04/11/16 Potential to Achieve Goals: Good    Frequency Min 4X/week   Barriers to discharge Decreased caregiver support IF pt has friend to stay with him and can provide care, pt should be ok with HHPT, if not would benefit from short tern SNF    Co-evaluation               End of Session Equipment Utilized During Treatment: Gait belt Activity Tolerance: Patient tolerated treatment well Patient left: in chair;with call bell/phone within reach;with chair alarm set           Time: 1110-1150 PT Time Calculation (min) (ACUTE ONLY): 40 min   Charges:   PT Evaluation $PT Eval Low Complexity: 1 Procedure PT Treatments $Gait Training: 8-22 mins   PT G Codes:       Samina Weekes K. Manson PasseyBrown, PT  Donnetta HailBrown, Zamir Staples Krall 03/28/2016, 11:59 AM

## 2016-03-28 NOTE — Progress Notes (Signed)
Reviewed discharge information with patient and caregiver. Answered all questions. Patient/caregiver able to teach back medications and reasons to contact MD/911. Patient verbalizes importance of PCP follow up appointment.  Keishawna Carranza M. Daneille Desilva, RN  

## 2016-03-28 NOTE — Discharge Summary (Signed)
Physician Discharge Summary  Jacob LollJohn Barajas ZOX:[id number] DOB: 10/04/1941 DOA: 03/26/2016  PCP: No PCP Per Patient  Admit date: 03/26/2016 Discharge date: 03/28/2016  Admitted From: home Disposition:  Discharge home with Home health   Recommendations for Outpatient Follow-up:  1. Follow up with PCP in 1-2 weeks 2. Please obtain BMP/CBC in one week 3. Please follow up on the following pending results: culture from arthrocentesis, synovial fluid  Home Health: yes Equipment/Devices: rolling walker.   Discharge Condition: stable.  CODE STATUS:Full code.  Diet recommendation; heart healthy   Brief/Interim Summary: 75 year old male with history of gout. Patient tells me that he has been having pain and swelling of the right knee for up to 6 months. The pain and swelling got worse leading to current admission. Arthrocentesis  has not revealed any organisms, but revealed [id number] WBC. No headache, no neck pain, no chest pain, no SOB, no GI symptoms and no urinary symptoms.   1-Acute gouty arthritis right knee, s/p arthrocentesis in the ED, synovial fluids study with [id number] wbc, + monosodium urate crystals, gram stain no organism, culture no Growth so far.  serum uric acid at 4, Continue colchicine ( does reduced to bid), continue with prednisone.  [facility]   Discharge Diagnoses:  Active Problems:   Gout   Acute gouty arthritis    Discharge Instructions     Medication List    TAKE these medications        colchicine 0.6 MG tablet  Take 1 tablet (0.6 mg total) by mouth 2 (two) times daily.     ibuprofen 200 MG tablet  Commonly known as:  ADVIL,MOTRIN  Take 400 mg by mouth every 4 (four) hours as needed for moderate pain.     predniSONE 20 MG tablet  Commonly known as:  DELTASONE  Take 2 tablets (40 mg total) by mouth daily with breakfast.  Start taking on:  03/29/2016     TART CHERRY ADVANCED PO  Take 1 tablet by mouth daily.        No Known  Allergies  Consultations:  IR    Procedures/Studies: Dg Ankle Complete Right  03/26/2016  CLINICAL DATA:  Knee and ankle pain. EXAM: RIGHT ANKLE - COMPLETE 3+ VIEW COMPARISON:  None. FINDINGS: Osseous alignment is normal. Bone mineralization is normal. Spurring and/or small old avulsion fracture fragments along the inferior margin of the medial malleolus, likely related to previous injury. No acute-appearing fracture line or displaced fracture fragment. Ankle mortise is symmetric. No evidence of significant degenerative change. Extensive atherosclerotic calcifications noted within the soft tissues at the level of the right ankle and distal calf. Atherosclerotic calcifications extend over the dorsum of the foot. Surrounding soft tissues are otherwise unremarkable. IMPRESSION: 1. No acute findings. 2. Chronic spurring and/or small old avulsion fracture fragments along the inferior margin of the medial malleolus. 3. Fairly extensive atherosclerosis. Electronically Signed   By: Bary RichardStan  Maynard M.D.   On: 03/26/2016 13:17   Dg Knee Complete 4 Views Right  03/26/2016  CLINICAL DATA:  Right knee pain and right ankle pain. EXAM: RIGHT KNEE - COMPLETE 4+ VIEW COMPARISON:  None. FINDINGS: Osseous alignment is normal. Bone mineralization is normal. No acute or suspicious osseous lesion. No fracture line or displaced fracture fragment. No definite joint effusion. Atherosclerotic calcifications within the popliteal fossa. IMPRESSION: 1. No acute findings. 2. No evidence of significant DJD. 3. Atherosclerotic calcifications within the popliteal fossa. Electronically Signed   By: Anne NgStan  Maynard M.D.  On: 03/26/2016 13:06       Subjective: Feeling better, pain has improved. Think he can go home. He has help at home  Discharge Exam: Filed Vitals:   03/27/16 2100 03/28/16 0511  BP: 147/80 144/90  Pulse: 94 93  Temp: 98.2 F (36.8 C) 97.9 F (36.6 C)  Resp: 18 18   Filed Vitals:   03/27/16 0641 03/27/16  1353 03/27/16 2100 03/28/16 0511  BP: 154/77 141/82 147/80 144/90  Pulse: 86 75 94 93  Temp: 98.1 F (36.7 C) 98.1 F (36.7 C) 98.2 F (36.8 C) 97.9 F (36.6 C)  TempSrc: Oral Oral Oral Oral  Resp: 16 18 18 18   Height:      SpO2: 95% 97% 93% 94%    General: Pt is alert, awake, not in acute distress Cardiovascular: RRR, S1/S2 +, no rubs, no gallops Respiratory: CTA bilaterally, no wheezing, no rhonchi Abdominal: Soft, NT, ND, bowel sounds + Extremities: no edema, no cyanosis Right knee with no redness, mild edema    The results of significant diagnostics from this hospitalization (including imaging, microbiology, ancillary and laboratory) are listed below for reference.     Microbiology: Recent Results (from the past 240 hour(s))  Body fluid culture     Status: None (Preliminary result)   Collection Time: 03/26/16 12:09 PM  Result Value Ref Range Status   Specimen Description SYNOVIAL RIGHT KNEE  Final   Special Requests Normal  Final   Gram Stain   Final    CYTOSPIN WBC PRESENT, PREDOMINANTLY PMN NO ORGANISMS SEEN Gram Stain Report Called to,Read Back By and Verified With: C. HALL RN AT 1255 ON 06.08.17 BY SHUEA    Culture   Final    NO GROWTH 2 DAYS Performed at [facility]    Report Status PENDING  Incomplete     Labs: BNP (last 3 results) No results for input(s): BNP in the last 8760 hours. Basic Metabolic Panel:  Recent Labs Lab 03/26/16 1107 03/27/16 0452 03/28/16 0519  NA 136 137 136  K 3.4* 4.0 3.9  CL 101 102 101  CO2 27 28 26   GLUCOSE 125* 110* 161*  BUN 12 19 26*  CREATININE 1.04 1.17 1.22  CALCIUM 8.7* 8.5* 8.6*  MG 2.0  --   --   PHOS  --  3.4  --    Liver Function Tests:  Recent Labs Lab 03/27/16 0452  ALBUMIN 3.7   No results for input(s): LIPASE, AMYLASE in the last 168 hours. No results for input(s): AMMONIA in the last 168 hours. CBC:  Recent Labs Lab 03/26/16 1107 03/28/16 0519  WBC 14.6* 9.3  NEUTROABS 10.2*   --   HGB 14.9 13.9  HCT 41.7 40.5  MCV 90.7 93.3  PLT 193 241   Cardiac Enzymes: No results for input(s): CKTOTAL, CKMB, CKMBINDEX, TROPONINI in the last 168 hours. BNP: Invalid input(s): POCBNP CBG: No results for input(s): GLUCAP in the last 168 hours. D-Dimer No results for input(s): DDIMER in the last 72 hours. Hgb A1c No results for input(s): HGBA1C in the last 72 hours. Lipid Profile No results for input(s): CHOL, HDL, LDLCALC, TRIG, CHOLHDL, LDLDIRECT in the last 72 hours. Thyroid function studies No results for input(s): TSH, T4TOTAL, T3FREE, THYROIDAB in the last 72 hours.  Invalid input(s): FREET3 Anemia work up No results for input(s): VITAMINB12, FOLATE, FERRITIN, TIBC, IRON, RETICCTPCT in the last 72 hours. Urinalysis    Component Value Date/Time   COLORURINE AMBER* 02/02/2016 1523  APPEARANCEUR CLEAR 02/02/2016 1523   LABSPEC 1.039* 02/02/2016 1523   PHURINE 5.5 02/02/2016 1523   GLUCOSEU NEGATIVE 02/02/2016 1523   HGBUR NEGATIVE 02/02/2016 1523   BILIRUBINUR SMALL* 02/02/2016 1523   KETONESUR 15* 02/02/2016 1523   PROTEINUR 30* 02/02/2016 1523   NITRITE NEGATIVE 02/02/2016 1523   LEUKOCYTESUR NEGATIVE 02/02/2016 1523   Sepsis Labs Invalid input(s): PROCALCITONIN,  WBC,  LACTICIDVEN Microbiology Recent Results (from the past 240 hour(s))  Body fluid culture     Status: None (Preliminary result)   Collection Time: 03/26/16 12:09 PM  Result Value Ref Range Status   Specimen Description SYNOVIAL RIGHT KNEE  Final   Special Requests Normal  Final   Gram Stain   Final    CYTOSPIN WBC PRESENT, PREDOMINANTLY PMN NO ORGANISMS SEEN Gram Stain Report Called to,Read Back By and Verified With: C. HALL RN AT 1255 ON 06.08.17 BY SHUEA    Culture   Final    NO GROWTH 2 DAYS Performed at [facility]    Report Status PENDING  Incomplete     Time coordinating discharge: Over 30 minutes  SIGNED:   Alba Cory, MD  [facility] 03/28/2016, 10:28 AM Pager [phone]  If 7PM-7AM, please contact night-coverage [email or web address] Password [email or web address]

## 2016-03-29 LAB — BODY FLUID CULTURE
Culture: NO GROWTH
SPECIAL REQUESTS: NORMAL

## 2016-03-30 DIAGNOSIS — M10061 Idiopathic gout, right knee: Secondary | ICD-10-CM | POA: Diagnosis not present

## 2016-03-30 DIAGNOSIS — I10 Essential (primary) hypertension: Secondary | ICD-10-CM | POA: Diagnosis not present

## 2016-03-30 DIAGNOSIS — Z7952 Long term (current) use of systemic steroids: Secondary | ICD-10-CM | POA: Diagnosis not present

## 2016-03-30 LAB — HEMOGLOBIN A1C
HEMOGLOBIN A1C: 5.7 % — AB (ref 4.8–5.6)
Mean Plasma Glucose: 117 mg/dL

## 2016-03-31 DIAGNOSIS — I10 Essential (primary) hypertension: Secondary | ICD-10-CM | POA: Diagnosis not present

## 2016-03-31 DIAGNOSIS — M10061 Idiopathic gout, right knee: Secondary | ICD-10-CM | POA: Diagnosis not present

## 2016-03-31 DIAGNOSIS — Z7952 Long term (current) use of systemic steroids: Secondary | ICD-10-CM | POA: Diagnosis not present

## 2016-04-03 DIAGNOSIS — I10 Essential (primary) hypertension: Secondary | ICD-10-CM | POA: Diagnosis not present

## 2016-04-03 DIAGNOSIS — Z7952 Long term (current) use of systemic steroids: Secondary | ICD-10-CM | POA: Diagnosis not present

## 2016-04-03 DIAGNOSIS — M10061 Idiopathic gout, right knee: Secondary | ICD-10-CM | POA: Diagnosis not present

## 2016-04-06 DIAGNOSIS — Z7952 Long term (current) use of systemic steroids: Secondary | ICD-10-CM | POA: Diagnosis not present

## 2016-04-06 DIAGNOSIS — M10061 Idiopathic gout, right knee: Secondary | ICD-10-CM | POA: Diagnosis not present

## 2016-04-06 DIAGNOSIS — I10 Essential (primary) hypertension: Secondary | ICD-10-CM | POA: Diagnosis not present

## 2016-04-08 DIAGNOSIS — M10061 Idiopathic gout, right knee: Secondary | ICD-10-CM | POA: Diagnosis not present

## 2016-04-08 DIAGNOSIS — Z7952 Long term (current) use of systemic steroids: Secondary | ICD-10-CM | POA: Diagnosis not present

## 2016-04-08 DIAGNOSIS — I10 Essential (primary) hypertension: Secondary | ICD-10-CM | POA: Diagnosis not present

## 2016-04-14 DIAGNOSIS — Z7952 Long term (current) use of systemic steroids: Secondary | ICD-10-CM | POA: Diagnosis not present

## 2016-04-14 DIAGNOSIS — M10061 Idiopathic gout, right knee: Secondary | ICD-10-CM | POA: Diagnosis not present

## 2016-04-14 DIAGNOSIS — I10 Essential (primary) hypertension: Secondary | ICD-10-CM | POA: Diagnosis not present

## 2016-04-17 DIAGNOSIS — I10 Essential (primary) hypertension: Secondary | ICD-10-CM | POA: Diagnosis not present

## 2016-04-17 DIAGNOSIS — Z7952 Long term (current) use of systemic steroids: Secondary | ICD-10-CM | POA: Diagnosis not present

## 2016-04-17 DIAGNOSIS — M10061 Idiopathic gout, right knee: Secondary | ICD-10-CM | POA: Diagnosis not present

## 2016-05-04 ENCOUNTER — Encounter (HOSPITAL_COMMUNITY): Payer: Self-pay | Admitting: Emergency Medicine

## 2016-05-04 ENCOUNTER — Inpatient Hospital Stay (HOSPITAL_COMMUNITY): Payer: Medicare Other

## 2016-05-04 ENCOUNTER — Inpatient Hospital Stay (HOSPITAL_COMMUNITY)
Admission: EM | Admit: 2016-05-04 | Discharge: 2016-05-07 | DRG: 689 | Disposition: A | Discharge status: Skilled Nursing Facility | Payer: Medicare Other | Attending: Family Medicine | Admitting: Family Medicine

## 2016-05-04 ENCOUNTER — Emergency Department (HOSPITAL_COMMUNITY): Payer: Medicare Other

## 2016-05-04 DIAGNOSIS — F4489 Other dissociative and conversion disorders: Secondary | ICD-10-CM | POA: Diagnosis not present

## 2016-05-04 DIAGNOSIS — R339 Retention of urine, unspecified: Secondary | ICD-10-CM

## 2016-05-04 DIAGNOSIS — Z808 Family history of malignant neoplasm of other organs or systems: Secondary | ICD-10-CM | POA: Diagnosis not present

## 2016-05-04 DIAGNOSIS — I6789 Other cerebrovascular disease: Secondary | ICD-10-CM | POA: Diagnosis not present

## 2016-05-04 DIAGNOSIS — M25461 Effusion, right knee: Secondary | ICD-10-CM | POA: Diagnosis not present

## 2016-05-04 DIAGNOSIS — G300 Alzheimer's disease with early onset: Secondary | ICD-10-CM | POA: Diagnosis present

## 2016-05-04 DIAGNOSIS — N179 Acute kidney failure, unspecified: Secondary | ICD-10-CM | POA: Diagnosis present

## 2016-05-04 DIAGNOSIS — M109 Gout, unspecified: Secondary | ICD-10-CM | POA: Diagnosis not present

## 2016-05-04 DIAGNOSIS — G9341 Metabolic encephalopathy: Secondary | ICD-10-CM | POA: Diagnosis present

## 2016-05-04 DIAGNOSIS — R4182 Altered mental status, unspecified: Secondary | ICD-10-CM | POA: Diagnosis not present

## 2016-05-04 DIAGNOSIS — M25561 Pain in right knee: Secondary | ICD-10-CM | POA: Diagnosis not present

## 2016-05-04 DIAGNOSIS — R627 Adult failure to thrive: Secondary | ICD-10-CM | POA: Diagnosis not present

## 2016-05-04 DIAGNOSIS — M25569 Pain in unspecified knee: Secondary | ICD-10-CM

## 2016-05-04 DIAGNOSIS — Z8249 Family history of ischemic heart disease and other diseases of the circulatory system: Secondary | ICD-10-CM | POA: Diagnosis not present

## 2016-05-04 DIAGNOSIS — R410 Disorientation, unspecified: Secondary | ICD-10-CM | POA: Diagnosis not present

## 2016-05-04 DIAGNOSIS — F0280 Dementia in other diseases classified elsewhere without behavioral disturbance: Secondary | ICD-10-CM | POA: Diagnosis present

## 2016-05-04 DIAGNOSIS — M1711 Unilateral primary osteoarthritis, right knee: Secondary | ICD-10-CM | POA: Diagnosis not present

## 2016-05-04 DIAGNOSIS — N390 Urinary tract infection, site not specified: Principal | ICD-10-CM | POA: Diagnosis present

## 2016-05-04 DIAGNOSIS — J9811 Atelectasis: Secondary | ICD-10-CM | POA: Diagnosis not present

## 2016-05-04 DIAGNOSIS — I447 Left bundle-branch block, unspecified: Secondary | ICD-10-CM | POA: Diagnosis present

## 2016-05-04 DIAGNOSIS — Z87891 Personal history of nicotine dependence: Secondary | ICD-10-CM | POA: Diagnosis not present

## 2016-05-04 DIAGNOSIS — R262 Difficulty in walking, not elsewhere classified: Secondary | ICD-10-CM | POA: Diagnosis not present

## 2016-05-04 DIAGNOSIS — R278 Other lack of coordination: Secondary | ICD-10-CM | POA: Diagnosis not present

## 2016-05-04 DIAGNOSIS — D72829 Elevated white blood cell count, unspecified: Secondary | ICD-10-CM | POA: Diagnosis not present

## 2016-05-04 DIAGNOSIS — J189 Pneumonia, unspecified organism: Secondary | ICD-10-CM

## 2016-05-04 DIAGNOSIS — R531 Weakness: Secondary | ICD-10-CM | POA: Diagnosis not present

## 2016-05-04 DIAGNOSIS — M6281 Muscle weakness (generalized): Secondary | ICD-10-CM | POA: Diagnosis not present

## 2016-05-04 DIAGNOSIS — M1000 Idiopathic gout, unspecified site: Secondary | ICD-10-CM | POA: Diagnosis not present

## 2016-05-04 LAB — COMPREHENSIVE METABOLIC PANEL
ALT: 18 U/L (ref 17–63)
AST: 29 U/L (ref 15–41)
Albumin: 3.9 g/dL (ref 3.5–5.0)
Alkaline Phosphatase: 45 U/L (ref 38–126)
Anion gap: 11 (ref 5–15)
BILIRUBIN TOTAL: 2.4 mg/dL — AB (ref 0.3–1.2)
BUN: 18 mg/dL (ref 6–20)
CO2: 25 mmol/L (ref 22–32)
CREATININE: 0.98 mg/dL (ref 0.61–1.24)
Calcium: 8.8 mg/dL — ABNORMAL LOW (ref 8.9–10.3)
Chloride: 98 mmol/L — ABNORMAL LOW (ref 101–111)
Glucose, Bld: 129 mg/dL — ABNORMAL HIGH (ref 65–99)
POTASSIUM: 4.5 mmol/L (ref 3.5–5.1)
Sodium: 134 mmol/L — ABNORMAL LOW (ref 135–145)
TOTAL PROTEIN: 8.1 g/dL (ref 6.5–8.1)

## 2016-05-04 LAB — CBC WITH DIFFERENTIAL/PLATELET
BASOS ABS: 0 10*3/uL (ref 0.0–0.1)
BASOS PCT: 0 %
EOS ABS: 0 10*3/uL (ref 0.0–0.7)
EOS PCT: 0 %
HCT: 43.1 % (ref 39.0–52.0)
Hemoglobin: 14.9 g/dL (ref 13.0–17.0)
Lymphocytes Relative: 8 %
Lymphs Abs: 1.4 10*3/uL (ref 0.7–4.0)
MCH: 32.3 pg (ref 26.0–34.0)
MCHC: 34.6 g/dL (ref 30.0–36.0)
MCV: 93.3 fL (ref 78.0–100.0)
MONO ABS: 1.4 10*3/uL — AB (ref 0.1–1.0)
Monocytes Relative: 8 %
Neutro Abs: 14.4 10*3/uL — ABNORMAL HIGH (ref 1.7–7.7)
Neutrophils Relative %: 84 %
PLATELETS: 225 10*3/uL (ref 150–400)
RBC: 4.62 MIL/uL (ref 4.22–5.81)
RDW: 13.8 % (ref 11.5–15.5)
WBC: 17.2 10*3/uL — ABNORMAL HIGH (ref 4.0–10.5)

## 2016-05-04 LAB — RAPID URINE DRUG SCREEN, HOSP PERFORMED
AMPHETAMINES: NOT DETECTED
BENZODIAZEPINES: NOT DETECTED
Barbiturates: NOT DETECTED
Cocaine: NOT DETECTED
Opiates: NOT DETECTED
Tetrahydrocannabinol: NOT DETECTED

## 2016-05-04 LAB — URINALYSIS, ROUTINE W REFLEX MICROSCOPIC
GLUCOSE, UA: NEGATIVE mg/dL
KETONES UR: NEGATIVE mg/dL
Nitrite: POSITIVE — AB
PROTEIN: 30 mg/dL — AB
Specific Gravity, Urine: 1.029 (ref 1.005–1.030)
pH: 5.5 (ref 5.0–8.0)

## 2016-05-04 LAB — URINE MICROSCOPIC-ADD ON

## 2016-05-04 LAB — STREP PNEUMONIAE URINARY ANTIGEN: STREP PNEUMO URINARY ANTIGEN: NEGATIVE

## 2016-05-04 LAB — ETHANOL

## 2016-05-04 MED ORDER — ENSURE ENLIVE PO LIQD
237.0000 mL | Freq: Two times a day (BID) | ORAL | Status: DC
  Administered 2016-05-05 – 2016-05-07 (×5): 237 mL via ORAL

## 2016-05-04 MED ORDER — LORAZEPAM 2 MG/ML IJ SOLN
0.5000 mg | Freq: Once | INTRAMUSCULAR | Status: AC
  Administered 2016-05-05: 0.5 mg via INTRAVENOUS
  Filled 2016-05-04: qty 1

## 2016-05-04 MED ORDER — ENOXAPARIN SODIUM 40 MG/0.4ML ~~LOC~~ SOLN
40.0000 mg | SUBCUTANEOUS | Status: DC
  Administered 2016-05-04 – 2016-05-06 (×3): 40 mg via SUBCUTANEOUS
  Filled 2016-05-04 (×3): qty 0.4

## 2016-05-04 MED ORDER — SODIUM CHLORIDE 0.9 % IV SOLN
INTRAVENOUS | Status: AC
  Administered 2016-05-04 – 2016-05-05 (×2): via INTRAVENOUS
  Administered 2016-05-05: 1000 mL via INTRAVENOUS
  Administered 2016-05-06 (×2): via INTRAVENOUS

## 2016-05-04 MED ORDER — LORAZEPAM 2 MG/ML IJ SOLN
0.5000 mg | INTRAMUSCULAR | Status: AC | PRN
  Administered 2016-05-04: 0.5 mg via INTRAVENOUS
  Filled 2016-05-04: qty 1

## 2016-05-04 MED ORDER — DEXTROSE 5 % IV SOLN
1.0000 g | INTRAVENOUS | Status: DC
  Administered 2016-05-05 – 2016-05-06 (×2): 1 g via INTRAVENOUS
  Filled 2016-05-04 (×3): qty 10

## 2016-05-04 MED ORDER — LORAZEPAM 2 MG/ML IJ SOLN
0.5000 mg | INTRAMUSCULAR | Status: DC | PRN
  Administered 2016-05-04: 0.5 mg via INTRAVENOUS
  Filled 2016-05-04: qty 1

## 2016-05-04 MED ORDER — DEXTROSE 5 % IV SOLN
1.0000 g | Freq: Once | INTRAVENOUS | Status: AC
  Administered 2016-05-04: 1 g via INTRAVENOUS
  Filled 2016-05-04: qty 10

## 2016-05-04 MED ORDER — DEXTROSE 5 % IV SOLN
500.0000 mg | Freq: Once | INTRAVENOUS | Status: AC
  Administered 2016-05-04: 500 mg via INTRAVENOUS
  Filled 2016-05-04: qty 500

## 2016-05-04 NOTE — Progress Notes (Signed)
PHARMACY NOTE -  CEFTRIAXONE   Pharmacy has been consulted to assist with dosing of Ceftriaxone for UTI. Dosage remains stable at 1gm IV q24h and need for further dosage adjustment appears unlikely at present.    Will sign off at this time.  Please reconsult if a change in clinical status warrants re-evaluation of dosage.  Terrilee FilesLeann Brazen Domangue, PharmD

## 2016-05-04 NOTE — ED Notes (Addendum)
Per EMS, Pt from home, pt c/o bilateral lower leg pain and swelling from hx of gout. Family sts his mental status is not what it normally is. Pt has hx of dementia. Pt appears to have been sitting in soiled brief for awhile unchanged by the four adults in the house. Family sts they just can't handle it.  Swelling and pain in legs not as severe as Saturday. Pt A&O to name, DOB, and situation. Unable to state day of the week. Speech and vision clear, denies difficulty swallowing. Generalized weakness but strengths equal bilaterally. Denies N/V/D, fever.

## 2016-05-04 NOTE — H&P (Signed)
History and Physical  Jacob Barajas ZOX:[id number] DOB: 18-Oct-1941 DOA: 05/04/2016  Referring physician: EDP PCP: No PCP Per Patient   Chief Complaint: confusion  HPI: Jacob Barajas is a 75 y.o. male   With h/o gout, was sent to ED by EMS due to confusion, per ED RN "Pt appears to have been sitting in soiled brief for awhile unchanged by the four adults in the house. Family sts they just can't handle it." patient is not able to provide reliable history, no family member available. ED course: he is confused, vital stable except sbp around 150-160, labs wbc 17.2, ua +UTI, cxr ? Infiltrate, patient denies cough, on room air, he received rocephin/zithro, hospitalist called to admit the patient.  Review of Systems:  Detail per HPI, Review of systems are otherwise negative  Past Medical History  Diagnosis Date  . Gout    Past Surgical History  Procedure Laterality Date  . Skin graft Right    Social History:  reports that he has quit smoking. He has never used smokeless tobacco. He reports that he does not drink alcohol. His drug history is not on file. Patient lives at home & is able to participate in activities of daily living independently   No Known Allergies  Family History  Problem Relation Age of Onset  . Heart disease Brother   . Cancer Sister     "throat" cancer      Prior to Admission medications   Medication Sig Start Date End Date Taking? Authorizing Provider  Misc Natural Products (TART CHERRY ADVANCED PO) Take 1 tablet by mouth daily.   Yes Historical Provider, MD  colchicine 0.6 MG tablet Take 1 tablet (0.6 mg total) by mouth 2 (two) times daily. Patient not taking: Reported on 05/04/2016 03/28/16   Belkys A Regalado, MD  predniSONE (DELTASONE) 20 MG tablet Take 2 tablets (40 mg total) by mouth daily with breakfast. Patient not taking: Reported on 05/04/2016 03/29/16   Alba Cory, MD    Physical Exam: BP 169/97 mmHg  Pulse 95  Temp(Src) 98.3 F (36.8 C)  (Oral)  Resp 18  SpO2 97%  General:  Confused, not oriented to time, know he is in the hospital , but not able to states the correct name Eyes: PERRL ENT: unremarkable Neck: supple, no JVD Cardiovascular: RRR Respiratory: CTABL Abdomen: soft/ND/ND, positive bowel sounds Skin: no rash Musculoskeletal:  Right knee mild tender, mild bulgy patella region, no significant erythema Psychiatric: calm/cooperative Neurologic: confused            Labs on Admission:  Basic Metabolic Panel:  Recent Labs Lab 05/04/16 1007  NA 134*  K 4.5  CL 98*  CO2 25  GLUCOSE 129*  BUN 18  CREATININE 0.98  CALCIUM 8.8*   Liver Function Tests:  Recent Labs Lab 05/04/16 1007  AST 29  ALT 18  ALKPHOS 45  BILITOT 2.4*  PROT 8.1  ALBUMIN 3.9   No results for input(s): LIPASE, AMYLASE in the last 168 hours. No results for input(s): AMMONIA in the last 168 hours. CBC:  Recent Labs Lab 05/04/16 1007  WBC 17.2*  NEUTROABS 14.4*  HGB 14.9  HCT 43.1  MCV 93.3  PLT 225   Cardiac Enzymes: No results for input(s): CKTOTAL, CKMB, CKMBINDEX, TROPONINI in the last 168 hours.  BNP (last 3 results) No results for input(s): BNP in the last 8760 hours.  ProBNP (last 3 results) No results for input(s): PROBNP in the last 8760 hours.  CBG: No  results for input(s): GLUCAP in the last 168 hours.  Radiological Exams on Admission: Dg Chest 2 View  05/04/2016  CLINICAL DATA:  Altered mental status EXAM: CHEST  2 VIEW COMPARISON:  02/02/2016 FINDINGS: Cardiomediastinal silhouette is stable. There is streaky atelectasis, scarring or early infiltrate in lingula. No pulmonary edema. Mild right basilar atelectasis. Mild degenerative changes lower thoracic spine. IMPRESSION: Streaky atelectasis, scarring or early infiltrate in lingula. No pulmonary edema. Mild degenerative changes thoracic spine. Electronically Signed   By: Natasha MeadLiviu  Pop M.D.   On: 05/04/2016 10:42     Assessment/Plan Present on  Admission:  . UTI (lower urinary tract infection)  Metabolic encephalopathy: likely from uti, will get uds and ct head as well.  UTI: with leukocytosis, confusion, urine culture pending, continue rocephin that is started from the ED.  PNA? No cough, lung clear on exam, incentive spirometer for now.  Right knee pain, slight warm and mild tender to touch right knee, no significant edema on exam, he dose has history of gout, knee x ray pending  FTT/weakness: family report not able to take care of him at home, will get PT, possible snf placement  DVT prophylaxis: lovenox  Consultants: none  Code Status: full   Family Communication:  Patient   Disposition Plan: admit to med surg  Time spent: 75mins  Deaven Urwin MD, PhD [facility] Pager [phone] If 7PM-7AM, please contact night-coverage at [email or web address], password [email or web address]

## 2016-05-04 NOTE — ED Notes (Signed)
Pt has been notified about urinalysis 

## 2016-05-04 NOTE — ED Notes (Signed)
Pt sister, Eber JonesCarolyn, [phone] or [phone]

## 2016-05-04 NOTE — ED Notes (Signed)
Patient transported to X-ray 

## 2016-05-04 NOTE — ED Notes (Signed)
Bed: RU04WA04 Expected date:  Expected time:  Means of arrival:  Comments: EMS- elderly, AMS/leg swelling

## 2016-05-04 NOTE — ED Provider Notes (Signed)
CSN: [id number]     Arrival date & time 05/04/16  0913 History   First MD Initiated Contact with Patient 05/04/16 [phone]     Chief Complaint  Patient presents with  . Altered Mental Status  . Foot Swelling     Level 5 caveat due to dementia  Patient is a 75 y.o. male presenting with altered mental status. The history is provided by the patient.  Altered Mental Status Patient presents complaining of pain in his right knee. States his gout is flaring up. States it was worse this weekend but is somewhat better today. Today is Monday. Also family had told nursing is more confused. He had been living by himself but then was living with relatives. Reportedly has been more confused. He had been in a soiled brief and reportedly been that way for a while. Family told the nurse she was not able take care of him. Patient states he has a good appetite but has not been eating much because he is not hungry. When asked the date patient states that his 01/04/1977.  Past Medical History  Diagnosis Date  . Gout    Past Surgical History  Procedure Laterality Date  . Skin graft Right    Family History  Problem Relation Age of Onset  . Heart disease Brother   . Cancer Sister     "throat" cancer   Social History  Substance Use Topics  . Smoking status: Former Games developer  . Smokeless tobacco: Never Used  . Alcohol Use: No    Review of Systems  Unable to perform ROS: Dementia      Allergies  Review of patient's allergies indicates no known allergies.  Home Medications   Prior to Admission medications   Medication Sig Start Date End Date Taking? Authorizing Provider  Misc Natural Products (TART CHERRY ADVANCED PO) Take 1 tablet by mouth daily.   Yes Historical Provider, MD  colchicine 0.6 MG tablet Take 1 tablet (0.6 mg total) by mouth 2 (two) times daily. Patient not taking: Reported on 05/04/2016 03/28/16   Belkys A Regalado, MD  predniSONE (DELTASONE) 20 MG tablet Take 2 tablets (40 mg total) by  mouth daily with breakfast. Patient not taking: Reported on 05/04/2016 03/29/16   Belkys A Regalado, MD   BP 152/83 mmHg  Pulse 110  Temp(Src) 100.1 F (37.8 C) (Oral)  Resp 16  Ht 5\' 9"  (1.753 m)  Wt 162 lb 7.7 oz (73.7 kg)  BMI 23.98 kg/m2  SpO2 97% Physical Exam  Constitutional: He appears well-developed.  HENT:  Head: Atraumatic.  Eyes: Pupils are equal, round, and reactive to light.  Neck: Neck supple.  Pulmonary/Chest: Effort normal.  Abdominal: Soft. There is no tenderness.  Musculoskeletal:  Mild tenderness to right knee. No erythema. Has elastic knee brace on.  Neurological: He is alert.  Mild confusion  Skin: Skin is warm.    ED Course  Procedures (including critical care time) Labs Review Labs Reviewed  URINE CULTURE - Abnormal; Notable for the following:    Culture MULTIPLE SPECIES PRESENT, SUGGEST RECOLLECTION (*)    All other components within normal limits  COMPREHENSIVE METABOLIC PANEL - Abnormal; Notable for the following:    Sodium 134 (*)    Chloride 98 (*)    Glucose, Bld 129 (*)    Calcium 8.8 (*)    Total Bilirubin 2.4 (*)    All other components within normal limits  URINALYSIS, ROUTINE W REFLEX MICROSCOPIC (NOT AT [facility]) - Abnormal; Notable for  the following:    Color, Urine ORANGE (*)    APPearance CLOUDY (*)    Hgb urine dipstick SMALL (*)    Bilirubin Urine SMALL (*)    Protein, ur 30 (*)    Nitrite POSITIVE (*)    Leukocytes, UA TRACE (*)    All other components within normal limits  CBC WITH DIFFERENTIAL/PLATELET - Abnormal; Notable for the following:    WBC 17.2 (*)    Neutro Abs 14.4 (*)    Monocytes Absolute 1.4 (*)    All other components within normal limits  URINE MICROSCOPIC-ADD ON - Abnormal; Notable for the following:    Squamous Epithelial / LPF 0-5 (*)    Bacteria, UA MANY (*)    All other components within normal limits  CBC - Abnormal; Notable for the following:    WBC 15.2 (*)    RBC 3.98 (*)    Hemoglobin 12.6 (*)     HCT 37.1 (*)    All other components within normal limits  COMPREHENSIVE METABOLIC PANEL - Abnormal; Notable for the following:    Glucose, Bld 123 (*)    BUN 21 (*)    Calcium 8.5 (*)    ALT 15 (*)    Total Bilirubin 2.3 (*)    All other components within normal limits  AMMONIA - Abnormal; Notable for the following:    Ammonia <9 (*)    All other components within normal limits  ETHANOL  MAGNESIUM  URINE RAPID DRUG SCREEN, HOSP PERFORMED  STREP PNEUMONIAE URINARY ANTIGEN  LEGIONELLA PNEUMOPHILA SEROGP 1 UR AG  VITAMIN B12  RPR  HIV ANTIBODY (ROUTINE TESTING)  BASIC METABOLIC PANEL  CBC    Imaging Review Dg Chest 2 View  05/04/2016  CLINICAL DATA:  Altered mental status EXAM: CHEST  2 VIEW COMPARISON:  02/02/2016 FINDINGS: Cardiomediastinal silhouette is stable. There is streaky atelectasis, scarring or early infiltrate in lingula. No pulmonary edema. Mild right basilar atelectasis. Mild degenerative changes lower thoracic spine. IMPRESSION: Streaky atelectasis, scarring or early infiltrate in lingula. No pulmonary edema. Mild degenerative changes thoracic spine. Electronically Signed   By: Natasha Mead M.D.   On: 05/04/2016 10:42   Ct Head Wo Contrast  05/04/2016  CLINICAL DATA:  Altered mental status, confusion. EXAM: CT HEAD WITHOUT CONTRAST TECHNIQUE: Contiguous axial images were obtained from the base of the skull through the vertex without intravenous contrast. COMPARISON:  None. FINDINGS: Brain: There is mild generalized age related parenchymal atrophy with commensurate dilatation of the sulci. There is moderate ventriculomegaly, slightly out of proportion to the degree of brain atrophy. Mild chronic small vessel ischemic changes noted within the bilateral periventricular and subcortical white matter regions. There is no mass, hemorrhage, edema or other evidence of acute parenchymal abnormality. No extra-axial hemorrhage. Vascular: No hyperdense vessel or unexpected  calcification. There are chronic calcified atherosclerotic changes of the large vessels at the skull base. Skull: Negative for fracture or focal lesion. Sinuses/Orbits: No acute findings. Other: None. IMPRESSION: 1. Moderate ventriculomegaly which is slightly out of proportion to the degree of underlying brain atrophy. This raises the possibility of a normal pressure hydrocephalus. 2. Chronic ischemic changes in the white matter. 3. No evidence of acute intracranial abnormality. No intracranial mass, hemorrhage or edema. Electronically Signed   By: Bary Richard M.D.   On: 05/04/2016 19:30   Dg Knee 2 Views Right  05/04/2016  CLINICAL DATA:  Right knee pain and swelling for 2 weeks. No known injury. EXAM: RIGHT KNEE -  3 VIEW COMPARISON:  None. FINDINGS: No evidence of fracture or dislocation. Alignment is maintained. Mild tricompartmental peripheral spurring and spurring of tibial spines. Joint spaces are preserved. No bony destructive change. There is a small joint effusion. Infrapatellar soft tissue prominence and probable edema. Vascular calcifications are seen. IMPRESSION: 1. No acute bony abnormality.  Mild osteoarthritis. 2. Small joint effusion. 3. Subcutaneous infrapatellar soft tissue prominence and probable soft tissue edema, nonspecific. Electronically Signed   By: Rubye OaksMelanie  Ehinger M.D.   On: 05/04/2016 18:14   I have personally reviewed and evaluated these images and lab results as part of my medical decision-making.   EKG Interpretation   Date/Time:  Monday May 04 2016 10:10:56 EDT Ventricular Rate:  99 PR Interval:    QRS Duration: 167 QT Interval:  416 QTC Calculation: 534 R Axis:   -29 Text Interpretation:  Sinus tachycardia Atrial premature complex Left  bundle branch block Confirmed by Rubin PayorPICKERING  MD, Harrold DonathNATHAN [phone] on  05/04/2016 10:15:16 AM      MDM   Final diagnoses:  Acute lower UTI    Patient with mental status    change. Has dementia. Has UTI. Admit to internal  medicine.  Benjiman CoreNathan Milus Fritze, MD 05/05/16 [phone]

## 2016-05-05 DIAGNOSIS — R339 Retention of urine, unspecified: Secondary | ICD-10-CM

## 2016-05-05 LAB — COMPREHENSIVE METABOLIC PANEL
ALBUMIN: 3.5 g/dL (ref 3.5–5.0)
ALK PHOS: 42 U/L (ref 38–126)
ALT: 15 U/L — AB (ref 17–63)
ANION GAP: 9 (ref 5–15)
AST: 22 U/L (ref 15–41)
BILIRUBIN TOTAL: 2.3 mg/dL — AB (ref 0.3–1.2)
BUN: 21 mg/dL — ABNORMAL HIGH (ref 6–20)
CALCIUM: 8.5 mg/dL — AB (ref 8.9–10.3)
CO2: 25 mmol/L (ref 22–32)
CREATININE: 1.1 mg/dL (ref 0.61–1.24)
Chloride: 103 mmol/L (ref 101–111)
GFR calc non Af Amer: >60 mL/min (ref >60)
GLUCOSE: 123 mg/dL — AB (ref 65–99)
Potassium: 3.5 mmol/L (ref 3.5–5.1)
SODIUM: 137 mmol/L (ref 135–145)
TOTAL PROTEIN: 7.3 g/dL (ref 6.5–8.1)

## 2016-05-05 LAB — CBC
HEMATOCRIT: 37.1 % — AB (ref 39.0–52.0)
HEMOGLOBIN: 12.6 g/dL — AB (ref 13.0–17.0)
MCH: 31.7 pg (ref 26.0–34.0)
MCHC: 34 g/dL (ref 30.0–36.0)
MCV: 93.2 fL (ref 78.0–100.0)
Platelets: 236 10*3/uL (ref 150–400)
RBC: 3.98 MIL/uL — AB (ref 4.22–5.81)
RDW: 13.7 % (ref 11.5–15.5)
WBC: 15.2 10*3/uL — ABNORMAL HIGH (ref 4.0–10.5)

## 2016-05-05 LAB — VITAMIN B12: VITAMIN B 12: 473 pg/mL (ref 180–914)

## 2016-05-05 LAB — URINE CULTURE

## 2016-05-05 LAB — MAGNESIUM: Magnesium: 2.1 mg/dL (ref 1.7–2.4)

## 2016-05-05 LAB — AMMONIA

## 2016-05-05 MED ORDER — TAMSULOSIN HCL 0.4 MG PO CAPS
0.4000 mg | ORAL_CAPSULE | Freq: Every day | ORAL | Status: DC
  Administered 2016-05-05 – 2016-05-06 (×2): 0.4 mg via ORAL
  Filled 2016-05-05 (×2): qty 1

## 2016-05-05 MED ORDER — METHYLPREDNISOLONE SODIUM SUCC 40 MG IJ SOLR
40.0000 mg | INTRAMUSCULAR | Status: AC
  Administered 2016-05-05 – 2016-05-06 (×2): 40 mg via INTRAVENOUS
  Filled 2016-05-05 (×2): qty 1

## 2016-05-05 NOTE — Progress Notes (Signed)
Initial Nutrition Assessment  DOCUMENTATION CODES:   Not applicable  INTERVENTION:  -RD to continue to monitor  NUTRITION DIAGNOSIS:   Inadequate oral intake related to poor appetite, social / environmental circumstances as evidenced by per patient/family report.  GOAL:   Patient will meet greater than or equal to 90% of their needs  MONITOR:   PO intake, Labs, I & O's, Weight trends  REASON FOR ASSESSMENT:   Malnutrition Screening Tool    ASSESSMENT:   Jacob Barajas is a 75 yo man With h/o gout, was sent to ED by EMS due to confusion, per ED RN "Pt appears to have been sitting in soiled brief for awhile unchanged by the four adults in the house. Family sts they just can't handle it." patient is not able to provide reliable history, no family member available.  Spoke with [name] at bedside. He presented with AMS but was a little more alert today during our conversation, but fell asleep during it! When asked about his recent 8#/4.7% severe wt loss in 1 month he states "I was cutting back."  Pt claims intentional wt loss vs. Confusion? At home, family states they can't handle taking care of him. Pt does not appear to be feeding assist. He could not remember what he had for breakfast. Documented PO is 50% thus far. He does state he is consuming ensures.  Nutrition-Focused physical exam completed. Findings are no fat depletion, no muscle depletion, and no edema.   Labs and Medications reviewed: Solumedrol  Diet Order:  Diet Heart Room service appropriate?: Yes; Fluid consistency:: Thin  Skin:  Reviewed, no issues  Last BM:  PTA  Height:   Ht Readings from Last 1 Encounters:  05/04/16 5\' 9"  (1.753 m)    Weight:   Wt Readings from Last 1 Encounters:  05/04/16 162 lb 7.7 oz (73.7 kg)    Ideal Body Weight:  72.72 kg  BMI:  Body mass index is 23.98 kg/(m^2).  Estimated Nutritional Needs:   Kcal:  1500-1800 calories  Protein:  75-90 grams  Fluid:  >/=  1.5L  EDUCATION NEEDS:   No education needs identified at this time  Dionne AnoWilliam M. Betrice Wanat, MS, RD LDN Inpatient Clinical Dietitian Pager [phone]

## 2016-05-05 NOTE — Progress Notes (Signed)
Nursing Note: Pt was in and cathed for 1200 cc of amber urine. Pt tolerated well. Pt was in and out cathed by Marshall & IlsleyKambi Kelly,NT.wbb

## 2016-05-05 NOTE — Progress Notes (Signed)
Chaplain consulted by RN.  Family with questions re: Advance Directives.   Sister Jacob Barajas at bedside.  Jacob Barajas concerned about pt's discharge plans and is inquiring about completing Advance Directive paperwork.  Pt is not able to display consistent orientation to place and is confused while this chaplain is in room.   Chaplain provided education around [facility] Power of KeachiAttorney and explained that pt would have to be alert and oriented to designate HCPOA.  Explained that pt's next of kin is default HCPOA.     Pt has biological son - Jacob Barajas.  Explained that if son is not able to serve as HCPOA, then default HCPOA is pt's siblings.    Son, Jacob Barajas, is incarcerated for 4 years.  Release date is 05/13/2016.   Sisters have not spoken to son and do not know how to contact him.  Pt recalls that [facility]  It is not clear whether this is [facility] [place], [place].   According to sisters, Pt's son does not know pt is hospitalized.    Jacob Barajas concerned that if pt discharges prior to son's release, she does not want "him to be given over to the state."  Also concerned about "state" of pt's son upon release - explaining that she is worried that he will begin using substances again.  Explained that if pt's son is present / contacted and able to make decisions, we would need to default to him as HCPOA until he shows he is unable to make decisions for pt.  Sister understand that if son is not able to make decisions, they can serve as HCPOA and be involved in discharge planning.     Spoke with SW about family concerns around discharge / HCPOA.      Jacob Barajas, Jacob Barajas

## 2016-05-05 NOTE — Progress Notes (Signed)
Nursing Note: Pt had not voided that we are aware.Pt checked and bed was wet.Pt scanned for 999.Paged for order to in and out cath pt.wbb

## 2016-05-05 NOTE — Evaluation (Signed)
Physical Therapy Evaluation Patient Details Name: Jacob LollJohn Rack MRN: [id number] DOB: Feb 15, 1941 Today's Date: 05/05/2016   History of Present Illness  Pt is a 75 year old male admitted for Metabolic encephalopathy and UTI with hx of gout.  Clinical Impression  Pt admitted with above diagnosis. Pt currently with functional limitations due to the deficits listed below (see PT Problem List).  Pt will benefit from skilled PT to increase their independence and safety with mobility to allow discharge to the venue listed below.  Pt presents with decreased cognition and no family present.  Pt requiring increased assist at this time.  Recommend d/c to SNF.     Follow Up Recommendations SNF;Supervision/Assistance - 24 hour    Equipment Recommendations  None recommended by PT (TBA if home, pt not reliable historian at this time)    Recommendations for Other Services       Precautions / Restrictions Precautions Precautions: Fall      Mobility  Bed Mobility Overal bed mobility: Needs Assistance;+2 for physical assistance Bed Mobility: Supine to Sit;Sit to Supine     Supine to sit: Max assist;+2 for safety/equipment;+2 for physical assistance Sit to supine: Max assist;+2 for safety/equipment;+2 for physical assistance   General bed mobility comments: pt inconsistently requiring assist likely due to cognition, multiple attempts to sit and lay while trying to perform the opposite, RN into room to assist with repositioning in supine, pt c/o pain in bil knees  Transfers Overall transfer level:  (NT for safety)                  Ambulation/Gait                Stairs            Wheelchair Mobility    Modified Rankin (Stroke Patients Only)       Balance                                             Pertinent Vitals/Pain Pain Assessment: Faces Faces Pain Scale: Hurts whole lot Pain Location: bil knees Pain Descriptors / Indicators:  Guarding;Grimacing Pain Intervention(s): Limited activity within patient's tolerance;Monitored during session;Repositioned    Home Living Family/patient expects to be discharged to:: Private residence Living Arrangements: Alone   Type of Home: House Home Access: Ramped entrance     Home Layout: One level   Additional Comments: (info from recent admission for gout a month ago)    Prior Function Level of Independence: Independent         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      Hand Dominance        Extremity/Trunk Assessment   Upper Extremity Assessment: Generalized weakness;Difficult to assess due to impaired cognition           Lower Extremity Assessment: Generalized weakness;Difficult to assess due to impaired cognition         Communication   Communication: HOH  Cognition Arousal/Alertness: Awake/alert Behavior During Therapy: Restless Overall Cognitive Status: No family/caregiver present to determine baseline cognitive functioning Area of Impairment: Orientation;Following commands Orientation Level: Disoriented to;Place;Time;Situation   Memory: Decreased short-term memory Following Commands: Follows one step commands inconsistently            General Comments      Exercises        Assessment/Plan    PT Assessment  Patient needs continued PT services  PT Diagnosis Difficulty walking;Altered mental status   PT Problem List Decreased strength;Decreased activity tolerance;Decreased mobility;Decreased knowledge of use of DME  PT Treatment Interventions DME instruction;Gait training;Functional mobility training;Patient/family education;Therapeutic activities;Therapeutic exercise;Balance training   PT Goals (Current goals can be found in the Care Plan section) Acute Rehab PT Goals PT Goal Formulation: Patient unable to participate in goal setting Time For Goal Achievement: 05/19/16 Potential to Achieve Goals: Fair    Frequency Min 3X/week    Barriers to discharge        Co-evaluation               End of Session   Activity Tolerance: Patient limited by pain Patient left: in bed;with call bell/phone within reach;with bed alarm set Nurse Communication: Mobility status         Time: 1101-1115 PT Time Calculation (min) (ACUTE ONLY): 14 min   Charges:   PT Evaluation $PT Eval Low Complexity: 1 Procedure     PT G Codes:        Fredrich Cory,KATHrine E 05/05/2016, 1:07 PM Zenovia Jarred, PT, DPT 05/05/2016 Pager: [phone]

## 2016-05-05 NOTE — Progress Notes (Signed)
I discussed with Dr. Butler Denmarkizwan. I think the CT read of NPH is very borderline and could be related solely to atrophy. In the setting of acute medical issues, would be very difficult to evaluate for this in the inpatient setting given that large volume LP with clinical response is the next step. Could consider outpatient referral for evaluation once acute medical issues have resolved if clinically indicated.   Ritta SlotMcNeill Dwan Fennel, MD [facility] [phone]  If 7pm- 7am, please page neurology on call as listed in AMION.

## 2016-05-05 NOTE — Progress Notes (Signed)
PROGRESS NOTE    Jacob Barajas  ZOX:[id number] DOB: 1941/04/23 DOA: 05/04/2016  PCP: No PCP Per Patient   Brief Narrative:  75 y/o with h/o Gout and dementia who lives alone. His son has been in jail for 4 yrs. His sister gives a history because he is confused. She states that there are many people going in and out of his house and she suspects he gives them money. He does not earn any money but she and her siblings give him money to manage his expenses. On a normal day, he drives. She thinks there are drugs being used in the house. He noted that he had no electricity the last time she went to visit him. He appeared very confused and he was brought to the ER. He was noted to have soiled his underwear. He was noted to have a UTI. There was a suspicion that he may have pneumonia and was started on Rocephin and Zithromax. He was also found to have right knee tenderness and mild swelling.   Subjective: Confused-   Assessment & Plan:   Principal Problem:   Metabolic encephalopathy - likely from UTI  - check B12, RPR, ammonia  - UDS negative - not able to eat today- cont IVF - low dose Ativan PRN - CT head reviewed with Neuro- doubtful it is NPH    Active Problems:   Acute gouty arthritis - right knee - due to AKI and age, will avoid Colchicine and NSAIDS - start low dose Solumedrol    UTI (lower urinary tract infection)/ urinary retention - culture unhelpful as it is growing multiple morphotypes - Rocephin - cont Foley for urinary retention - start Flomax   Leukocytosis - due to UTI and Gout   DVT prophylaxis: Lovenox Code Status: full code Family Communication: Noelle Penner- discussed trying to decide on who will be his POA  Disposition Plan: likely will need SNF  Consultants:   none Procedures:   none Antimicrobials:  Anti-infectives    Start     Dose/Rate Route Frequency Ordered Stop   05/05/16 1300  cefTRIAXone (ROCEPHIN) 1 g in dextrose 5 % 50 mL IVPB     1  g 100 mL/hr over 30 Minutes Intravenous Every 24 hours 05/04/16 2333     05/04/16 1300  cefTRIAXone (ROCEPHIN) 1 g in dextrose 5 % 50 mL IVPB     1 g 100 mL/hr over 30 Minutes Intravenous  Once 05/04/16 1246 05/04/16 1348   05/04/16 1300  azithromycin (ZITHROMAX) 500 mg in dextrose 5 % 250 mL IVPB     500 mg 250 mL/hr over 60 Minutes Intravenous  Once 05/04/16 1246 05/04/16 1710       Objective: Filed Vitals:   05/04/16 1418 05/04/16 2300 05/05/16 0639 05/05/16 1215  BP: 165/95 145/93 155/92 152/83  Pulse: 102 124 112 110  Temp: 98.7 F (37.1 C) 98.4 F (36.9 C) 98.3 F (36.8 C) 100.1 F (37.8 C)  TempSrc: Oral Oral Oral Oral  Resp: Height:  (1.753 m)     Weight: 73.7 kg (162 lb 7.7 oz)     SpO2: 99% 96% 97% 97%    Intake/Output Summary (Last 24 hours) at 05/05/16 1447 Last data filed at 05/05/16 1011  Gross per 24 hour  Intake   1140 ml  Output   1300 ml  Net   -160 ml   Filed Weights   05/04/16 1418  Weight: 73.7 kg (162 lb 7.7 oz)  Examination: General exam: Appears comfortable  HEENT: PERRLA, oral mucosa moist, no sclera icterus or thrush Respiratory system: Clear to auscultation. Respiratory effort normal. Cardiovascular system: S1 & S2 heard, RRR.  No murmurs  Gastrointestinal system: Abdomen soft, non-tender, nondistended. Normal bowel sound. No organomegaly Central nervous system: Alert and oriented. No focal neurological deficits. Extremities: No cyanosis, clubbing or edema Skin: No rashes or ulcers Psychiatry:  Mood & affect appropriate.     Data Reviewed: I have personally reviewed following labs and imaging studies  CBC:  Recent Labs Lab 05/04/16 1007 05/05/16 0354  WBC 17.2* 15.2*  NEUTROABS 14.4*  --   HGB 14.9 12.6*  HCT 43.1 37.1*  MCV 93.3 93.2  PLT 225 236   Basic Metabolic Panel:  Recent Labs Lab 05/04/16 1007 05/05/16 0354  NA 134* 137  K 4.5 3.5  CL 98* 103  CO2 25 25  GLUCOSE 129* 123*  BUN 18  21*  CREATININE 0.98 1.10  CALCIUM 8.8* 8.5*  MG  --  2.1   GFR: Estimated Creatinine Clearance: 57.1 mL/min (by C-G formula based on Cr of 1.1). Liver Function Tests:  Recent Labs Lab 05/04/16 1007 05/05/16 0354  AST 29 22  ALT 18 15*  ALKPHOS 45 42  BILITOT 2.4* 2.3*  PROT 8.1 7.3  ALBUMIN 3.9 3.5   No results for input(s): LIPASE, AMYLASE in the last 168 hours. No results for input(s): AMMONIA in the last 168 hours. Coagulation Profile: No results for input(s): INR, PROTIME in the last 168 hours. Cardiac Enzymes: No results for input(s): CKTOTAL, CKMB, CKMBINDEX, TROPONINI in the last 168 hours. BNP (last 3 results) No results for input(s): PROBNP in the last 8760 hours. HbA1C: No results for input(s): HGBA1C in the last 72 hours. CBG: No results for input(s): GLUCAP in the last 168 hours. Lipid Profile: No results for input(s): CHOL, HDL, LDLCALC, TRIG, CHOLHDL, LDLDIRECT in the last 72 hours. Thyroid Function Tests: No results for input(s): TSH, T4TOTAL, FREET4, T3FREE, THYROIDAB in the last 72 hours. Anemia Panel: No results for input(s): VITAMINB12, FOLATE, FERRITIN, TIBC, IRON, RETICCTPCT in the last 72 hours. Urine analysis:    Component Value Date/Time   COLORURINE ORANGE* 05/04/2016 1118   APPEARANCEUR CLOUDY* 05/04/2016 1118   LABSPEC 1.029 05/04/2016 1118   PHURINE 5.5 05/04/2016 1118   GLUCOSEU NEGATIVE 05/04/2016 1118   HGBUR SMALL* 05/04/2016 1118   BILIRUBINUR SMALL* 05/04/2016 1118   KETONESUR NEGATIVE 05/04/2016 1118   PROTEINUR 30* 05/04/2016 1118   NITRITE POSITIVE* 05/04/2016 1118   LEUKOCYTESUR TRACE* 05/04/2016 1118   Sepsis Labs: @LABRCNTIP (procalcitonin:4,lacticidven:4) ) Recent Results (from the past 240 hour(s))  Urine culture     Status: Abnormal   Collection Time: 05/04/16 11:18 AM  Result Value Ref Range Status   Specimen Description URINE, CLEAN CATCH  Final   Special Requests NONE  Final   Culture MULTIPLE SPECIES PRESENT,  SUGGEST RECOLLECTION (A)  Final   Report Status 05/05/2016 FINAL  Final         Radiology Studies: Dg Chest 2 View  05/04/2016  CLINICAL DATA:  Altered mental status EXAM: CHEST  2 VIEW COMPARISON:  02/02/2016 FINDINGS: Cardiomediastinal silhouette is stable. There is streaky atelectasis, scarring or early infiltrate in lingula. No pulmonary edema. Mild right basilar atelectasis. Mild degenerative changes lower thoracic spine. IMPRESSION: Streaky atelectasis, scarring or early infiltrate in lingula. No pulmonary edema. Mild degenerative changes thoracic spine. Electronically Signed   By: Natasha MeadLiviu  Pop M.D.   On: 05/04/2016 10:42  Ct Head Wo Contrast  05/04/2016  CLINICAL DATA:  Altered mental status, confusion. EXAM: CT HEAD WITHOUT CONTRAST TECHNIQUE: Contiguous axial images were obtained from the base of the skull through the vertex without intravenous contrast. COMPARISON:  None. FINDINGS: Brain: There is mild generalized age related parenchymal atrophy with commensurate dilatation of the sulci. There is moderate ventriculomegaly, slightly out of proportion to the degree of brain atrophy. Mild chronic small vessel ischemic changes noted within the bilateral periventricular and subcortical white matter regions. There is no mass, hemorrhage, edema or other evidence of acute parenchymal abnormality. No extra-axial hemorrhage. Vascular: No hyperdense vessel or unexpected calcification. There are chronic calcified atherosclerotic changes of the large vessels at the skull base. Skull: Negative for fracture or focal lesion. Sinuses/Orbits: No acute findings. Other: None. IMPRESSION: 1. Moderate ventriculomegaly which is slightly out of proportion to the degree of underlying brain atrophy. This raises the possibility of a normal pressure hydrocephalus. 2. Chronic ischemic changes in the white matter. 3. No evidence of acute intracranial abnormality. No intracranial mass, hemorrhage or edema. Electronically  Signed   By: Bary Richard M.D.   On: 05/04/2016 19:30   Dg Knee 2 Views Right  05/04/2016  CLINICAL DATA:  Right knee pain and swelling for 2 weeks. No known injury. EXAM: RIGHT KNEE - 3 VIEW COMPARISON:  None. FINDINGS: No evidence of fracture or dislocation. Alignment is maintained. Mild tricompartmental peripheral spurring and spurring of tibial spines. Joint spaces are preserved. No bony destructive change. There is a small joint effusion. Infrapatellar soft tissue prominence and probable edema. Vascular calcifications are seen. IMPRESSION: 1. No acute bony abnormality.  Mild osteoarthritis. 2. Small joint effusion. 3. Subcutaneous infrapatellar soft tissue prominence and probable soft tissue edema, nonspecific. Electronically Signed   By: Rubye Oaks M.D.   On: 05/04/2016 18:14      Scheduled Meds: . cefTRIAXone (ROCEPHIN)  IV  1 g Intravenous Q24H  . enoxaparin (LOVENOX) injection  40 mg Subcutaneous Q24H  . feeding supplement (ENSURE ENLIVE)  237 mL Oral BID BM  . methylPREDNISolone (SOLU-MEDROL) injection  40 mg Intravenous Q24H   Continuous Infusions: . sodium chloride 125 mL/hr (05/05/16 1103)     LOS: 1 day    Time spent in minutes: 35    Javani Spratt, MD [facility] Pager: [email or web address] Password [facility] 05/05/2016, 2:47 PM

## 2016-05-05 NOTE — Progress Notes (Signed)
Nursing Note: Pt is yelling to top of his lungs,agitated,trying to get OOB says he is leaving.Pt required 1:1 to keep him in bed.Pt given ativan .5 mg by day shift RN.Pt has NT at bedside for Safety so pt does not fall.Jacob GrayerKambi Barajas,NT is at bedside with pt.wbb

## 2016-05-06 DIAGNOSIS — G9341 Metabolic encephalopathy: Secondary | ICD-10-CM

## 2016-05-06 DIAGNOSIS — N390 Urinary tract infection, site not specified: Principal | ICD-10-CM

## 2016-05-06 DIAGNOSIS — R339 Retention of urine, unspecified: Secondary | ICD-10-CM

## 2016-05-06 DIAGNOSIS — M109 Gout, unspecified: Secondary | ICD-10-CM

## 2016-05-06 LAB — BASIC METABOLIC PANEL
ANION GAP: 7 (ref 5–15)
BUN: 20 mg/dL (ref 6–20)
CALCIUM: 8 mg/dL — AB (ref 8.9–10.3)
CHLORIDE: 107 mmol/L (ref 101–111)
CO2: 24 mmol/L (ref 22–32)
Creatinine, Ser: 0.85 mg/dL (ref 0.61–1.24)
GFR calc non Af Amer: >60 mL/min (ref >60)
GLUCOSE: 139 mg/dL — AB (ref 65–99)
POTASSIUM: 3.5 mmol/L (ref 3.5–5.1)
Sodium: 138 mmol/L (ref 135–145)

## 2016-05-06 LAB — CBC
HEMATOCRIT: 33.2 % — AB (ref 39.0–52.0)
HEMOGLOBIN: 11.4 g/dL — AB (ref 13.0–17.0)
MCH: 31.9 pg (ref 26.0–34.0)
MCHC: 34.3 g/dL (ref 30.0–36.0)
MCV: 93 fL (ref 78.0–100.0)
Platelets: 215 10*3/uL (ref 150–400)
RBC: 3.57 MIL/uL — AB (ref 4.22–5.81)
RDW: 13.5 % (ref 11.5–15.5)
WBC: 12.2 10*3/uL — ABNORMAL HIGH (ref 4.0–10.5)

## 2016-05-06 LAB — RPR: RPR Ser Ql: NONREACTIVE

## 2016-05-06 LAB — LEGIONELLA PNEUMOPHILA SEROGP 1 UR AG: L. pneumophila Serogp 1 Ur Ag: NEGATIVE

## 2016-05-06 LAB — HIV ANTIBODY (ROUTINE TESTING W REFLEX): HIV Screen 4th Generation wRfx: NONREACTIVE

## 2016-05-06 NOTE — Clinical Social Work Placement (Signed)
   CLINICAL SOCIAL WORK PLACEMENT  NOTE  Date:  05/06/2016  Patient Details  Name: Jacob Barajas MRN: [id number] Date of Birth: Apr 29, 1941  Clinical Social Work is seeking post-discharge placement for this patient at the Skilled  Nursing Facility level of care (*CSW will initial, date and re-position this form in  chart as items are completed):  Yes   Patient/family provided with [facility] Clinical Social Work Department's list of facilities offering this level of care within the geographic area requested by the patient (or if unable, by the patient's family).  Yes   Patient/family informed of their freedom to choose among providers that offer the needed level of care, that participate in Medicare, Medicaid or managed care program needed by the patient, have an available bed and are willing to accept the patient.  Yes   Patient/family informed of [facility]'s ownership interest in [facility] and [facility], as well as of the fact that they are under no obligation to receive care at these facilities.  PASRR submitted to EDS on 05/06/16     PASRR number received on 05/06/16     Existing PASRR number confirmed on       FL2 transmitted to all facilities in geographic area requested by pt/family on 05/06/16     FL2 transmitted to all facilities within larger geographic area on       Patient informed that his/her managed care company has contracts with or will negotiate with certain facilities, including the following:            Patient/family informed of bed offers received.  Patient chooses bed at       Physician recommends and patient chooses bed at      Patient to be transferred to   on  .  Patient to be transferred to facility by       Patient family notified on   of transfer.  Name of family member notified:        PHYSICIAN       Additional Comment:    _______________________________________________ Donnie CoffinErin M Skarlette Lattner, LCSW 05/06/2016, 2:10 PM

## 2016-05-06 NOTE — Progress Notes (Signed)
PROGRESS NOTE    Jacob LollJohn Kernen  UJW:[id number] DOB: Dec 13, 1940 DOA: 05/04/2016  PCP: No PCP Per Patient   Brief Narrative:  75 y/o with h/o Gout and dementia who lives alone. His son has been in jail for 4 yrs. His sister gives a history because he is confused. She states that there are many people going in and out of his house and she suspects he gives them money. He does not earn any money but she and her siblings give him money to manage his expenses. On a normal day, he drives. She thinks there are drugs being used in the house. He noted that he had no electricity the last time she went to visit him. He appeared very confused and he was brought to the ER. He was noted to have soiled his underwear. He was noted to have a UTI. There was a suspicion that he may have pneumonia and was started on Rocephin and Zithromax. He was also found to have right knee tenderness and mild swelling.   Subjective: Less confused, says gout pain is better today, eating and drinking  Assessment & Plan:   Principal Problem:   Metabolic encephalopathy - improving slowly - likely from UTI  - B12, ammonia ok,  RPR pending - UDS negative - eating today, d/c IVF today, SL IV - low dose Ativan PRN - CT head reviewed with Neuro- doubtful it is NPH, follow up with neurology outpatient per Dr. Amada JupiterKirkpatrick.    Active Problems:   Acute gouty arthritis - improving - right knee - due to AKI and age, will avoid Colchicine and NSAIDS - stop solumedrol after today's dose    UTI (lower urinary tract infection)/ urinary retention - culture unhelpful as it is growing multiple morphotypes - Rocephin IV - cont Foley for urinary retention - start Flomax   Leukocytosis - due to UTI and Gout   DVT prophylaxis: Lovenox Code Status: full code Family Communication: Noelle PennerLou Land-sister at bedside Disposition Plan: working on SNF placement Consultants:   none Procedures:   none Antimicrobials:  Anti-infectives    Start     Dose/Rate Route Frequency Ordered Stop   05/05/16 1300  cefTRIAXone (ROCEPHIN) 1 g in dextrose 5 % 50 mL IVPB     1 g 100 mL/hr over 30 Minutes Intravenous Every 24 hours 05/04/16 2333     05/04/16 1300  cefTRIAXone (ROCEPHIN) 1 g in dextrose 5 % 50 mL IVPB     1 g 100 mL/hr over 30 Minutes Intravenous  Once 05/04/16 1246 05/04/16 1348   05/04/16 1300  azithromycin (ZITHROMAX) 500 mg in dextrose 5 % 250 mL IVPB     500 mg 250 mL/hr over 60 Minutes Intravenous  Once 05/04/16 1246 05/04/16 1710       Objective: Filed Vitals:   05/05/16 0639 05/05/16 1215 05/05/16 2217 05/06/16 0658  BP: 155/92 152/83 135/81 123/77  Pulse: 112 110 78 67  Temp: 98.3 F (36.8 C) 100.1 F (37.8 C) 97.8 F (36.6 C) 97.8 F (36.6 C)  TempSrc: Oral Oral Oral Oral  Resp: 18 16 14 16   Height:      Weight:      SpO2: 97% 97% 97% 98%    Intake/Output Summary (Last 24 hours) at 05/06/16 1053 Last data filed at 05/06/16 0803  Gross per 24 hour  Intake 2738.75 ml  Output   1700 ml  Net 1038.75 ml   Filed Weights   05/04/16 1418  Weight: 73.7 kg (162 lb  7.7 oz)    Examination: General exam: Appears comfortable  HEENT: PERRLA, oral mucosa moist, no sclera icterus or thrush Respiratory system: Clear to auscultation. Respiratory effort normal. Cardiovascular system: S1 & S2 heard, RRR.  No murmurs  Gastrointestinal system: Abdomen soft, non-tender, nondistended. Normal bowel sound. No organomegaly Central nervous system: Alert and oriented. No focal neurological deficits. Extremities: No cyanosis, clubbing or edema Skin: No rashes or ulcers Psychiatry:  Mood & affect appropriate.     Data Reviewed: I have personally reviewed following labs and imaging studies  CBC:  Recent Labs Lab 05/04/16 1007 05/05/16 0354 05/06/16 0350  WBC 17.2* 15.2* 12.2*  NEUTROABS 14.4*  --   --   HGB 14.9 12.6* 11.4*  HCT 43.1 37.1* 33.2*  MCV 93.3 93.2 93.0  PLT 225 236 215   Basic Metabolic  Panel:  Recent Labs Lab 05/04/16 1007 05/05/16 0354 05/06/16 0350  NA 134* 137 138  K 4.5 3.5 3.5  CL 98* 103 107  CO2 25 25 24   GLUCOSE 129* 123* 139*  BUN 18 21* 20  CREATININE 0.98 1.10 0.85  CALCIUM 8.8* 8.5* 8.0*  MG  --  2.1  --    GFR: Estimated Creatinine Clearance: 73.9 mL/min (by C-G formula based on Cr of 0.85). Liver Function Tests:  Recent Labs Lab 05/04/16 1007 05/05/16 0354  AST 29 22  ALT 18 15*  ALKPHOS 45 42  BILITOT 2.4* 2.3*  PROT 8.1 7.3  ALBUMIN 3.9 3.5   No results for input(s): LIPASE, AMYLASE in the last 168 hours.  Recent Labs Lab 05/05/16 1651  AMMONIA <9*   Coagulation Profile: No results for input(s): INR, PROTIME in the last 168 hours. Cardiac Enzymes: No results for input(s): CKTOTAL, CKMB, CKMBINDEX, TROPONINI in the last 168 hours. BNP (last 3 results) No results for input(s): PROBNP in the last 8760 hours. HbA1C: No results for input(s): HGBA1C in the last 72 hours. CBG: No results for input(s): GLUCAP in the last 168 hours. Lipid Profile: No results for input(s): CHOL, HDL, LDLCALC, TRIG, CHOLHDL, LDLDIRECT in the last 72 hours. Thyroid Function Tests: No results for input(s): TSH, T4TOTAL, FREET4, T3FREE, THYROIDAB in the last 72 hours. Anemia Panel:  Recent Labs  05/05/16 1651  VITAMINB12 473   Urine analysis:    Component Value Date/Time   COLORURINE ORANGE* 05/04/2016 1118   APPEARANCEUR CLOUDY* 05/04/2016 1118   LABSPEC 1.029 05/04/2016 1118   PHURINE 5.5 05/04/2016 1118   GLUCOSEU NEGATIVE 05/04/2016 1118   HGBUR SMALL* 05/04/2016 1118   BILIRUBINUR SMALL* 05/04/2016 1118   KETONESUR NEGATIVE 05/04/2016 1118   PROTEINUR 30* 05/04/2016 1118   NITRITE POSITIVE* 05/04/2016 1118   LEUKOCYTESUR TRACE* 05/04/2016 1118    Recent Results (from the past 240 hour(s))  Urine culture     Status: Abnormal   Collection Time: 05/04/16 11:18 AM  Result Value Ref Range Status   Specimen Description URINE, CLEAN  CATCH  Final   Special Requests NONE  Final   Culture MULTIPLE SPECIES PRESENT, SUGGEST RECOLLECTION (A)  Final   Report Status 05/05/2016 FINAL  Final     Radiology Studies: Ct Head Wo Contrast  05/04/2016  CLINICAL DATA:  Altered mental status, confusion. EXAM: CT HEAD WITHOUT CONTRAST TECHNIQUE: Contiguous axial images were obtained from the base of the skull through the vertex without intravenous contrast. COMPARISON:  None. FINDINGS: Brain: There is mild generalized age related parenchymal atrophy with commensurate dilatation of the sulci. There is moderate ventriculomegaly, slightly out  of proportion to the degree of brain atrophy. Mild chronic small vessel ischemic changes noted within the bilateral periventricular and subcortical white matter regions. There is no mass, hemorrhage, edema or other evidence of acute parenchymal abnormality. No extra-axial hemorrhage. Vascular: No hyperdense vessel or unexpected calcification. There are chronic calcified atherosclerotic changes of the large vessels at the skull base. Skull: Negative for fracture or focal lesion. Sinuses/Orbits: No acute findings. Other: None. IMPRESSION: 1. Moderate ventriculomegaly which is slightly out of proportion to the degree of underlying brain atrophy. This raises the possibility of a normal pressure hydrocephalus. 2. Chronic ischemic changes in the white matter. 3. No evidence of acute intracranial abnormality. No intracranial mass, hemorrhage or edema. Electronically Signed   By: Bary Richard M.D.   On: 05/04/2016 19:30   Dg Knee 2 Views Right  05/04/2016  CLINICAL DATA:  Right knee pain and swelling for 2 weeks. No known injury. EXAM: RIGHT KNEE - 3 VIEW COMPARISON:  None. FINDINGS: No evidence of fracture or dislocation. Alignment is maintained. Mild tricompartmental peripheral spurring and spurring of tibial spines. Joint spaces are preserved. No bony destructive change. There is a small joint effusion. Infrapatellar  soft tissue prominence and probable edema. Vascular calcifications are seen. IMPRESSION: 1. No acute bony abnormality.  Mild osteoarthritis. 2. Small joint effusion. 3. Subcutaneous infrapatellar soft tissue prominence and probable soft tissue edema, nonspecific. Electronically Signed   By: Rubye Oaks M.D.   On: 05/04/2016 18:14      Scheduled Meds: . cefTRIAXone (ROCEPHIN)  IV  1 g Intravenous Q24H  . enoxaparin (LOVENOX) injection  40 mg Subcutaneous Q24H  . feeding supplement (ENSURE ENLIVE)  237 mL Oral BID BM  . methylPREDNISolone (SOLU-MEDROL) injection  40 mg Intravenous Q24H  . tamsulosin  0.4 mg Oral QPC supper   Continuous Infusions: . sodium chloride 125 mL/hr at 05/06/16 0808     LOS: 2 days     Time spent in minutes: 25    Standley Dakins, MD [facility] Pager: [email or web address] Password [facility] 05/06/2016, 10:53 AM

## 2016-05-06 NOTE — NC FL2 (Signed)
  [place] MEDICAID FL2 LEVEL OF CARE SCREENING TOOL     IDENTIFICATION  Patient Name: Jacob Barajas Birthdate: [birth year] Sex: male Admission Date (Current Location): 05/04/2016  [facility] [place]   Facility and Address:  [facility],  [address], [place] [id number]      Provider Number: [id number]  Attending Physician Name and Address:  Cleora Fleetlanford L Johnson, MD  Relative Name and Phone Number:       Current Level of Care: Hospital Recommended Level of Care: Skilled Nursing Facility Prior Approval Number:    Date Approved/Denied:   PASRR Number: [id number]  Discharge Plan: SNF    Current Diagnoses: Patient Active Problem List   Diagnosis Date Noted  . Urinary retention 05/05/2016  . UTI (lower urinary tract infection) 05/04/2016  . Metabolic encephalopathy 05/04/2016  . Acute gouty arthritis 03/26/2016  . Orthostatic hypotension 02/03/2016  . Gout 02/03/2016  . Syncope 02/02/2016    Orientation RESPIRATION BLADDER Height & Weight     Self  Normal Continent Weight: 162 lb 7.7 oz (73.7 kg) Height:  5\' 9"  (175.3 cm)  BEHAVIORAL SYMPTOMS/MOOD NEUROLOGICAL BOWEL NUTRITION STATUS      Continent Diet (Heart Healthy )  AMBULATORY STATUS COMMUNICATION OF NEEDS Skin   Limited Assist Verbally Normal                       Personal Care Assistance Level of Assistance  Bathing, Dressing Bathing Assistance: Limited assistance   Dressing Assistance: Limited assistance     Functional Limitations Info             SPECIAL CARE FACTORS FREQUENCY  PT (By licensed PT), OT (By licensed OT)     PT Frequency: 5 OT Frequency: 5            Contractures      Additional Factors Info  Code Status, Allergies Code Status Info: Full Code Allergies Info: NKA            Current Medications (05/06/2016):  This is the current hospital active medication list Current Facility-Administered Medications  Medication Dose Route  Frequency Provider Last Rate Last Dose  . 0.9 %  sodium chloride infusion   Intravenous Continuous [place] Cyndie MullL Johnson, MD 125 mL/hr at 05/06/16 [id number]    . cefTRIAXone (ROCEPHIN) 1 g in dextrose 5 % 50 mL IVPB  1 g Intravenous Q24H Leann T Poindexter, RPH   1 g at 05/06/16 1227  . enoxaparin (LOVENOX) injection 40 mg  40 mg Subcutaneous Q24H Albertine GratesFang Xu, MD   40 mg at 05/05/16 2147  . feeding supplement (ENSURE ENLIVE) (ENSURE ENLIVE) liquid 237 mL  237 mL Oral BID BM Albertine GratesFang Xu, MD   237 mL at 05/06/16 0936  . tamsulosin (FLOMAX) capsule 0.4 mg  0.4 mg Oral QPC supper Calvert CantorSaima Rizwan, MD   0.4 mg at 05/05/16 1720     Discharge Medications: Please see discharge summary for a list of discharge medications.  Relevant Imaging Results:  Relevant Lab Results:   Additional Information    Donnie CoffinErin M Ebubechukwu Jedlicka, LCSW

## 2016-05-06 NOTE — Progress Notes (Signed)
   05/06/16 0900  Clinical Encounter Type  Visited With Patient  Visit Type Initial;Psychological support;Spiritual support  Referral From Chaplain  Consult/Referral To Chaplain  Spiritual Encounters  Spiritual Needs Emotional;Other (Comment) (PAstoral Support/Conversation)  Stress Factors  Patient Stress Factors None identified   I visited with the patient per referral from another Chaplain who stated that he had visited with the patient and the patient's sisters the previous day.  The patient was eating breakfast at the time of my visit and I offered to come back at a more convenient time.   Will follow-up with the patient.   Chaplain Clint BolderBrittany Paticia Moster M.Div.

## 2016-05-06 NOTE — Clinical Social Work Note (Signed)
Clinical Social Work Assessment  Patient Details  Name: Jacob Barajas MRN: [id number] Date of Birth: 1940/11/03  Date of referral:  05/06/16               Reason for consult:  Discharge Planning                Permission sought to share information with:  Facility Industrial/product designerContact Representative Permission granted to share information::  Yes, Release of Information Signed  Name::        Agency::     Relationship::     Contact Information:     Housing/Transportation Living arrangements for the past 2 months:  Single Family Home Source of Information:  Other (Comment Required) (Sibling-Lou) Patient Interpreter Needed:  None Criminal Activity/Legal Involvement Pertinent to Current Situation/Hospitalization:    Significant Relationships:  Siblings Lives with:  Self Do you feel safe going back to the place where you live?  No Need for family participation in patient care:  Yes (Comment)  Care giving concerns:  CSW received consult to speak with patient regarding discharge plans.    Social Worker assessment / plan:  CSW spoke with patients sister, [name], about current discharge plans. Patient is currently living at home alone without any electricity. Patients sister states that he is unable to take care of himself and would do best at a rehab facility. PT has recommended SNF and CSW will fax his information to the SNF's in the area. Patient's sister seemed very involved and concerned about her brothers well-being. Patients [facility]   Employment status:  Retired Health and safety inspectornsurance information:  Medicare PT Recommendations:  Skilled Nursing Facility Information / Referral to community resources:  Skilled Nursing Facility  Patient/Family's Response to care:  Patients sister was Adult nurseappreciative of CSW.  Patient/Family's Understanding of and Emotional Response to Diagnosis, Current Treatment, and Prognosis:  Patients sister is very involved and asked many  questions regarding SNF placement. Patients sister thinks he would benefit from short-term rehab. Patients sister understood the process and current treatment.   Emotional Assessment Appearance:  Appears stated age Attitude/Demeanor/Rapport:    Affect (typically observed):  Unable to Assess Orientation:  Oriented to Self Alcohol / Substance use:    Psych involvement (Current and /or in the community):     Discharge Needs  Concerns to be addressed:  Discharge Planning Concerns Readmission within the last 30 days:  No Current discharge risk:  None Barriers to Discharge:  No Barriers Identified   Donnie Coffinrin M Eon Zunker, LCSW 05/06/2016, 11:41 AM

## 2016-05-06 NOTE — Care Management Note (Signed)
Case Management Note  Patient Details  Name: Jacob Barajas MRN: [id number] Date of Birth: 1941-08-31  Subjective/Objective:    75 yo admitted with Metabolic encephalopathy                Action/Plan: From home alone and to dc to snf.  Expected Discharge Date:   (unknown)               Expected Discharge Plan:  Skilled Nursing Facility  In-House Referral:  Clinical Social Work  Discharge planning Services  CM Consult  Post Acute Care Choice:    Choice offered to:     DME Arranged:    DME Agency:     HH Arranged:    HH Agency:     Status of Service:  In process, will continue to follow  If discussed at Long Length of Stay Meetings, dates discussed:    Additional CommentsBartholome Bill:  Anakaren Campion H, RN 05/06/2016, 2:22 PM  [phone]

## 2016-05-07 DIAGNOSIS — M6281 Muscle weakness (generalized): Secondary | ICD-10-CM | POA: Diagnosis not present

## 2016-05-07 DIAGNOSIS — M1A9XX0 Chronic gout, unspecified, without tophus (tophi): Secondary | ICD-10-CM | POA: Diagnosis not present

## 2016-05-07 DIAGNOSIS — F0390 Unspecified dementia without behavioral disturbance: Secondary | ICD-10-CM | POA: Diagnosis not present

## 2016-05-07 DIAGNOSIS — R262 Difficulty in walking, not elsewhere classified: Secondary | ICD-10-CM | POA: Diagnosis not present

## 2016-05-07 DIAGNOSIS — N401 Enlarged prostate with lower urinary tract symptoms: Secondary | ICD-10-CM | POA: Diagnosis not present

## 2016-05-07 DIAGNOSIS — R339 Retention of urine, unspecified: Secondary | ICD-10-CM | POA: Diagnosis not present

## 2016-05-07 DIAGNOSIS — N390 Urinary tract infection, site not specified: Secondary | ICD-10-CM | POA: Diagnosis not present

## 2016-05-07 DIAGNOSIS — M1711 Unilateral primary osteoarthritis, right knee: Secondary | ICD-10-CM | POA: Diagnosis not present

## 2016-05-07 DIAGNOSIS — R2681 Unsteadiness on feet: Secondary | ICD-10-CM | POA: Diagnosis not present

## 2016-05-07 DIAGNOSIS — W19XXXS Unspecified fall, sequela: Secondary | ICD-10-CM | POA: Diagnosis not present

## 2016-05-07 DIAGNOSIS — M1000 Idiopathic gout, unspecified site: Secondary | ICD-10-CM | POA: Diagnosis not present

## 2016-05-07 DIAGNOSIS — D72829 Elevated white blood cell count, unspecified: Secondary | ICD-10-CM | POA: Diagnosis not present

## 2016-05-07 DIAGNOSIS — F0391 Unspecified dementia with behavioral disturbance: Secondary | ICD-10-CM | POA: Diagnosis not present

## 2016-05-07 DIAGNOSIS — G9389 Other specified disorders of brain: Secondary | ICD-10-CM | POA: Diagnosis not present

## 2016-05-07 DIAGNOSIS — M109 Gout, unspecified: Secondary | ICD-10-CM | POA: Diagnosis not present

## 2016-05-07 DIAGNOSIS — N138 Other obstructive and reflux uropathy: Secondary | ICD-10-CM | POA: Diagnosis not present

## 2016-05-07 DIAGNOSIS — R531 Weakness: Secondary | ICD-10-CM | POA: Diagnosis not present

## 2016-05-07 DIAGNOSIS — G9341 Metabolic encephalopathy: Secondary | ICD-10-CM | POA: Diagnosis not present

## 2016-05-07 DIAGNOSIS — R278 Other lack of coordination: Secondary | ICD-10-CM | POA: Diagnosis not present

## 2016-05-07 DIAGNOSIS — R4182 Altered mental status, unspecified: Secondary | ICD-10-CM | POA: Diagnosis not present

## 2016-05-07 MED ORDER — ENSURE ENLIVE PO LIQD: 237.0000 mL | Freq: Two times a day (BID) | ORAL | Status: DC

## 2016-05-07 MED ORDER — TAMSULOSIN HCL 0.4 MG PO CAPS
0.4000 mg | ORAL_CAPSULE | Freq: Every day | ORAL | Status: DC
Start: 2016-05-07 — End: 2017-08-09

## 2016-05-07 MED ORDER — CEPHALEXIN 500 MG PO CAPS: 500.0000 mg | ORAL_CAPSULE | Freq: Two times a day (BID) | ORAL | Status: DC

## 2016-05-07 MED ORDER — CLONIDINE HCL 0.1 MG PO TABS
0.2000 mg | ORAL_TABLET | Freq: Once | ORAL | Status: AC
  Administered 2016-05-07: 0.2 mg via ORAL
  Filled 2016-05-07: qty 2

## 2016-05-07 NOTE — Discharge Summary (Signed)
Physician Discharge Summary  Jacob LollJohn Barajas ZOX:[id number] DOB: Dec 28, 1940 DOA: 05/04/2016  PCP: No PCP Per Patient  Admit date: 05/04/2016 Discharge date: 05/07/2016  Admitted From:  Home Disposition: SNF  Recommendations for Outpatient Follow-up:  1. Follow up with urologist in 1-2 weeks - [facility] appointment [facility] 2. Follow up with neurologist in 1 month 3. Please obtain BMP/CBC in 1-2 weeks 4. Please remove foley catheter in 1 week and do voiding trial  - Follow up with urologist  Discharge Condition: Stable CODE STATUS: FULL Diet recommendation: Heart Healthy    Brief/Interim Summary: 75 y/o with h/o Gout and dementia who lives alone. His son has been in jail for 4 yrs. His sister gives a history because he is confused. She states that there are many people going in and out of his house and she suspects he gives them money. He does not earn any money but she and her siblings give him money to manage his expenses. On a normal day, he drives. She thinks there are drugs being used in the house. He noted that he had no electricity the last time she went to visit him. He appeared very confused and he was brought to the ER. He was noted to have soiled his underwear. He was noted to have a UTI. There was a suspicion that he may have pneumonia and was started on Rocephin and Zithromax. He was also found to have right knee tenderness and mild swelling.   Subjective: Less confused, says gout pain is better today, eating and drinking  Assessment & Plan:  Principal Problem:  Metabolic encephalopathy - improved now to baseline.   - likely from UTI  - B12, ammonia ok, RPR neg - UDS negative - eating today, d/c IVF - low dose Ativan PRN - CT head reviewed with Neuro- doubtful it is NPH, follow up with neurology outpatient per Jacob Barajas.   Acute gouty arthritis - symptoms resolved now - right knee - due to AKI and age, will avoid Colchicine and NSAIDS - stopped  solumedrol on 7/19.     UTI (lower urinary tract infection)/ urinary retention - culture unhelpful as it is growing multiple morphotypes - Rocephin IV - cont Foley for urinary retention, remove in 1 week and do voiding trial.  Follow up with urologist.   - Continue Flomax - Follow up with urology outpatient.    Leukocytosis  - elevated after steroids given but trending down.  - repeat outpatient in 1-2 weeks.   DVT prophylaxis: Lovenox Code Status: full code Family Communication: Jacob PennerLou Barajas-sister at bedside Disposition Plan: working on SNF placement Consultants:   none Procedures:   none  Discharge Diagnoses:  Principal Problem:   Metabolic encephalopathy Active Problems:   Acute gouty arthritis   UTI (lower urinary tract infection)   Urinary retention  Discharge Instructions     Discharge Instructions    Diet - low sodium heart healthy    Complete by:  As directed      Increase activity slowly    Complete by:  As directed             Medication List    STOP taking these medications        colchicine 0.6 MG tablet     predniSONE 20 MG tablet  Commonly known as:  DELTASONE     TART CHERRY ADVANCED PO      TAKE these medications        cephALEXin 500 MG capsule  Commonly known as:  KEFLEX  Take 1 capsule (500 mg total) by mouth 2 (two) times daily.     feeding supplement (ENSURE ENLIVE) Liqd  Take 237 mLs by mouth 2 (two) times daily between meals.     tamsulosin 0.4 MG Caps capsule  Commonly known as:  FLOMAX  Take 1 capsule (0.4 mg total) by mouth daily after supper.       Follow-up Information    Follow up with [facility]. Schedule an appointment as soon as possible for a visit in 2 weeks.   Why:  Hospital Follow Up abnormal CT scan   Contact information:   [address]     [address] [place] [address] [phone]      Follow up with [facility] Schedule an appointment as  soon as possible for a visit in 2 weeks.   Why:  Evaluation for urinary retention, Hospital Follow Up   Contact information:   [address] [place] [address] [phone]      No Known Allergies  Procedures/Studies: Dg Chest 2 View  05/04/2016  CLINICAL DATA:  Altered mental status EXAM: CHEST  2 VIEW COMPARISON:  02/02/2016 FINDINGS: Cardiomediastinal silhouette is stable. There is streaky atelectasis, scarring or early infiltrate in lingula. No pulmonary edema. Mild right basilar atelectasis. Mild degenerative changes lower thoracic spine. IMPRESSION: Streaky atelectasis, scarring or early infiltrate in lingula. No pulmonary edema. Mild degenerative changes thoracic spine. Electronically Signed   By: Jacob Barajas M.D.   On: 05/04/2016 10:42   Ct Head Wo Contrast  05/04/2016  CLINICAL DATA:  Altered mental status, confusion. EXAM: CT HEAD WITHOUT CONTRAST TECHNIQUE: Contiguous axial images were obtained from the base of the skull through the vertex without intravenous contrast. COMPARISON:  None. FINDINGS: Brain: There is mild generalized age related parenchymal atrophy with commensurate dilatation of the sulci. There is moderate ventriculomegaly, slightly out of proportion to the degree of brain atrophy. Mild chronic small vessel ischemic changes noted within the bilateral periventricular and subcortical white matter regions. There is no mass, hemorrhage, edema or other evidence of acute parenchymal abnormality. No extra-axial hemorrhage. Vascular: No hyperdense vessel or unexpected calcification. There are chronic calcified atherosclerotic changes of the large vessels at the skull base. Skull: Negative for fracture or focal lesion. Sinuses/Orbits: No acute findings. Other: None. IMPRESSION: 1. Moderate ventriculomegaly which is slightly out of proportion to the degree of underlying brain atrophy. This raises the possibility of a normal pressure hydrocephalus. 2. Chronic ischemic changes  in the white matter. 3. No evidence of acute intracranial abnormality. No intracranial mass, hemorrhage or edema. Electronically Signed   By: Jacob Barajas M.D.   On: 05/04/2016 19:30   Dg Knee 2 Views Right  05/04/2016  CLINICAL DATA:  Right knee pain and swelling for 2 weeks. No known injury. EXAM: RIGHT KNEE - 3 VIEW COMPARISON:  None. FINDINGS: No evidence of fracture or dislocation. Alignment is maintained. Mild tricompartmental peripheral spurring and spurring of tibial spines. Joint spaces are preserved. No bony destructive change. There is a small joint effusion. Infrapatellar soft tissue prominence and probable edema. Vascular calcifications are seen. IMPRESSION: 1. No acute bony abnormality.  Mild osteoarthritis. 2. Small joint effusion. 3. Subcutaneous infrapatellar soft tissue prominence and probable soft tissue edema, nonspecific. Electronically Signed   By: Rubye Oaks M.D.   On: 05/04/2016 18:14    Subjective: Pt without complaints.    Discharge Exam: Filed Vitals:  05/06/16 2215 05/07/16 0600  BP: 138/78 161/82  Pulse: 63 60  Temp: 97.8 F (36.6 C) 97.3 F (36.3 C)  Resp: 16 17   Filed Vitals:   05/06/16 0658 05/06/16 1327 05/06/16 2215 05/07/16 0600  BP: 123/77 130/72 138/78 161/82  Pulse: 67 72 63 60  Temp: 97.8 F (36.6 C) 98.1 F (36.7 C) 97.8 F (36.6 C) 97.3 F (36.3 C)  TempSrc: Oral Oral Oral Oral  Resp: 16 16 16 17   Height:      Weight:      SpO2: 98% 97% 93% 98%   General exam: Appears comfortable NAD.  HEENT: PERRLA, oral mucosa moist, no sclera icterus or thrush Respiratory system: Clear to auscultation. Respiratory effort normal. Cardiovascular system: S1 & S2 heard, RRR. No murmurs  Gastrointestinal system: Abdomen soft, non-tender, nondistended. Normal bowel sound. No organomegaly Central nervous system: Alert and oriented. No focal neurological deficits. Extremities: No cyanosis, clubbing or edema Skin: No rashes or  ulcers Psychiatry: Mood & affect appropriate.   The results of significant diagnostics from this hospitalization (including imaging, microbiology, ancillary and laboratory) are listed below for reference.     Microbiology: Recent Results (from the past 240 hour(s))  Urine culture     Status: Abnormal   Collection Time: 05/04/16 11:18 AM  Result Value Ref Range Status   Specimen Description URINE, CLEAN CATCH  Final   Special Requests NONE  Final   Culture MULTIPLE SPECIES PRESENT, SUGGEST RECOLLECTION (A)  Final   Report Status 05/05/2016 FINAL  Final     Labs: BNP (last 3 results) No results for input(s): BNP in the last 8760 hours. Basic Metabolic Panel:  Recent Labs Lab 05/04/16 1007 05/05/16 0354 05/06/16 0350  NA 134* 137 138  K 4.5 3.5 3.5  CL 98* 103 107  CO2 25 25 24   GLUCOSE 129* 123* 139*  BUN 18 21* 20  CREATININE 0.98 1.10 0.85  CALCIUM 8.8* 8.5* 8.0*  MG  --  2.1  --    Liver Function Tests:  Recent Labs Lab 05/04/16 1007 05/05/16 0354  AST 29 22  ALT 18 15*  ALKPHOS 45 42  BILITOT 2.4* 2.3*  PROT 8.1 7.3  ALBUMIN 3.9 3.5   No results for input(s): LIPASE, AMYLASE in the last 168 hours.  Recent Labs Lab 05/05/16 1651  AMMONIA <9*   CBC:  Recent Labs Lab 05/04/16 1007 05/05/16 0354 05/06/16 0350  WBC 17.2* 15.2* 12.2*  NEUTROABS 14.4*  --   --   HGB 14.9 12.6* 11.4*  HCT 43.1 37.1* 33.2*  MCV 93.3 93.2 93.0  PLT 225 236 215   Cardiac Enzymes: No results for input(s): CKTOTAL, CKMB, CKMBINDEX, TROPONINI in the last 168 hours. BNP: Invalid input(s): POCBNP CBG: No results for input(s): GLUCAP in the last 168 hours. D-Dimer No results for input(s): DDIMER in the last 72 hours. Hgb A1c No results for input(s): HGBA1C in the last 72 hours. Lipid Profile No results for input(s): CHOL, HDL, LDLCALC, TRIG, CHOLHDL, LDLDIRECT in the last 72 hours. Thyroid function studies No results for input(s): TSH, T4TOTAL, T3FREE, THYROIDAB  in the last 72 hours.  Invalid input(s): FREET3 Anemia work up  Recent Labs  05/05/16 1651  VITAMINB12 473   Urinalysis    Component Value Date/Time   COLORURINE ORANGE* 05/04/2016 1118   APPEARANCEUR CLOUDY* 05/04/2016 1118   LABSPEC 1.029 05/04/2016 1118   PHURINE 5.5 05/04/2016 1118   GLUCOSEU NEGATIVE 05/04/2016 1118   HGBUR SMALL* 05/04/2016  1118   BILIRUBINUR SMALL* 05/04/2016 1118   KETONESUR NEGATIVE 05/04/2016 1118   PROTEINUR 30* 05/04/2016 1118   NITRITE POSITIVE* 05/04/2016 1118   LEUKOCYTESUR TRACE* 05/04/2016 1118   Sepsis Labs Invalid input(s): PROCALCITONIN,  WBC,  LACTICIDVEN Microbiology Recent Results (from the past 240 hour(s))  Urine culture     Status: Abnormal   Collection Time: 05/04/16 11:18 AM  Result Value Ref Range Status   Specimen Description URINE, CLEAN CATCH  Final   Special Requests NONE  Final   Culture MULTIPLE SPECIES PRESENT, SUGGEST RECOLLECTION (A)  Final   Report Status 05/05/2016 FINAL  Final   Time coordinating discharge: 32 mins  SIGNED:  Standley Dakins, MD  [facility] 05/07/2016, 11:25 AM Pager   If 7PM-7AM, please contact night-coverage [email or web address] Password [email or web address]

## 2016-05-07 NOTE — Clinical Social Work Placement (Signed)
   CLINICAL SOCIAL WORK PLACEMENT  NOTE  Date:  05/07/2016  Patient Details  Name: Jacob Barajas MRN: [id number] Date of Birth: September 16, 1941  Clinical Social Work is seeking post-discharge placement for this patient at the Skilled  Nursing Facility level of care (*CSW will initial, date and re-position this form in  chart as items are completed):  Yes   Patient/family provided with [facility] Clinical Social Work Department's list of facilities offering this level of care within the geographic area requested by the patient (or if unable, by the patient's family).  Yes   Patient/family informed of their freedom to choose among providers that offer the needed level of care, that participate in Medicare, Medicaid or managed care program needed by the patient, have an available bed and are willing to accept the patient.  Yes   Patient/family informed of [facility]'s ownership interest in [facility] and [facility], as well as of the fact that they are under no obligation to receive care at these facilities.  PASRR submitted to EDS on 05/06/16     PASRR number received on 05/06/16     Existing PASRR number confirmed on       FL2 transmitted to all facilities in geographic area requested by pt/family on 05/06/16     FL2 transmitted to all facilities within larger geographic area on       Patient informed that his/her managed care company has contracts with or will negotiate with certain facilities, including the following:        Yes   Patient/family informed of bed offers received.  Patient chooses bed at [name]     Physician recommends and patient chooses bed at      Patient to be transferred [name] on 05/07/16.  Patient to be transferred to facility by [facility]     Patient family notified on 05/07/16 of transfer.  Name of family member notified:  Eber Jonesarolyn     PHYSICIAN       Additional Comment:    _______________________________________________ Donnie CoffinErin M  Aubre Quincy, LCSW 05/07/2016, 2:26 PM

## 2016-05-07 NOTE — Progress Notes (Signed)
Patient is set to discharge to [facility] today. Patient & sister, Eber JonesCarolyn, aware. Discharge packet given to RN, Selena BattenKim. [facility] called for transport.   Stacy GardnerErin Yareth Kearse, LCSWA Clinical Social Worker [phone]

## 2016-05-07 NOTE — Care Management Important Message (Signed)
Important Message  Patient Details  Name: Jacob Barajas MRN: [id number] Date of Birth: 02/14/1941   Medicare Important Message Given:  Yes    Haskell FlirtJamison, Miranda Frese 05/07/2016, 10:20 AMImportant Message  Patient Details  Name: Jacob Barajas MRN: [id number] Date of Birth: 02/14/1941   Medicare Important Message Given:  Yes    Haskell FlirtJamison, Heli Dino 05/07/2016, 10:20 AM

## 2016-05-07 NOTE — Progress Notes (Signed)
Attempted to call report x 3 to facility.  Message left on nursing manager voicemail requesting callback.

## 2016-05-11 ENCOUNTER — Non-Acute Institutional Stay (SKILLED_NURSING_FACILITY): Payer: Medicare Other | Admitting: Internal Medicine

## 2016-05-11 ENCOUNTER — Encounter: Payer: Self-pay | Admitting: Internal Medicine

## 2016-05-11 DIAGNOSIS — R339 Retention of urine, unspecified: Secondary | ICD-10-CM | POA: Diagnosis not present

## 2016-05-11 DIAGNOSIS — D72829 Elevated white blood cell count, unspecified: Secondary | ICD-10-CM | POA: Diagnosis not present

## 2016-05-11 DIAGNOSIS — R531 Weakness: Secondary | ICD-10-CM

## 2016-05-11 DIAGNOSIS — M1A9XX0 Chronic gout, unspecified, without tophus (tophi): Secondary | ICD-10-CM

## 2016-05-11 DIAGNOSIS — G9389 Other specified disorders of brain: Secondary | ICD-10-CM

## 2016-05-11 DIAGNOSIS — N390 Urinary tract infection, site not specified: Secondary | ICD-10-CM | POA: Diagnosis not present

## 2016-05-11 NOTE — Progress Notes (Signed)
LOCATION: [facility]  PCP: No PCP Per Patient   Code Status: Full Code  Goals of care: Advanced Directive information Advanced Directives 05/04/2016  Does patient have an advance directive? No  Would patient like information on creating an advanced directive? No - patient declined information       Extended Emergency Contact Information Primary Emergency Contact: Smith,Linda  [place] Mobile Phone: [phone] Relation: Friend Secondary Emergency Contact: [facility] Address: [address]          [facility] [place], [place] [address] [place] Home Phone: [phone] Relation: Friend   No Known Allergies  Chief Complaint  Patient presents with  . New Admit To SNF    New Admission     HPI:  Patient is a 75 y.o. male seen today for short term rehabilitation post hospital admission from 05/04/16-05/07/16 with metabolic encephalopathy from dehydration, acute right knee gout and urinary tract infection. His urine drug screen was negative. CT head had concerns for possible NPH and is pending outpatient neurology appointment. He received iv fluids, antibiotics and solumedrol. He had a foley in place for urinary retention.He has medical history of dementia and gout. He is seen in his room today.   Review of Systems:  Constitutional: Negative for fever, diaphoresis.  HENT: Negative for headache, congestion, nasal discharge, difficulty swallowing.   Eyes: Negative for double vision and discharge.  Respiratory: Negative for cough, shortness of breath and wheezing.   Cardiovascular: Negative for chest pain, palpitations, leg swelling.  Gastrointestinal: Negative for heartburn, nausea, vomiting, abdominal pain. Last bowel movement was yesterday. Genitourinary: Negative for flank pain.  Musculoskeletal: Negative for fall in the facility.  Skin: Negative for itching, rash.  Neurological: Negative for dizziness. Psychiatric/Behavioral: Negative for  depression    Past Medical History:  Diagnosis Date  . Gout    Past Surgical History:  Procedure Laterality Date  . SKIN GRAFT Right    Social History:   reports that he has quit smoking. He has never used smokeless tobacco. He reports that he does not drink alcohol. His drug history is not on file.  Family History  Problem Relation Age of Onset  . Heart disease Brother   . Cancer Sister     "throat" cancer    Medications:   Medication List       Accurate as of 05/11/16 12:18 PM. Always use your most recent med list.          cephALEXin 500 MG capsule Commonly known as:  KEFLEX Take 1 capsule (500 mg total) by mouth 2 (two) times daily.   feeding supplement (ENSURE ENLIVE) Liqd Take 237 mLs by mouth 2 (two) times daily between meals.   tamsulosin 0.4 MG Caps capsule Commonly known as:  FLOMAX Take 1 capsule (0.4 mg total) by mouth daily after supper.       Immunizations: Immunization History  Administered Date(s) Administered  . Pneumococcal Conjugate-13 02/04/2016     Physical Exam: Vitals:   05/11/16 1215  BP: 132/83  Pulse: 98  Resp: 19  Temp: 98.3 F (36.8 C)  TempSrc: Oral  SpO2: 97%  Weight: 162 lb (73.5 kg)  Height:  (1.753 m)   Body mass index is 23.92 kg/m.  General- elderly male, well built, in no acute distress Head- normocephalic, atraumatic Nose- no nasal discharge Throat- moist mucus membrane Eyes- PERRLA, EOMI, no pallor, no icterus, no discharge Neck- no cervical lymphadenopathy Cardiovascular- normal s1,s2, no murmur Respiratory-  bilateral clear to auscultation, no wheeze, no rhonchi, no crackles, no use of accessory muscles Abdomen- bowel sounds present, soft, non tender, has foley catheter Musculoskeletal- able to move all 4 extremities, generalized weakness more to his lower extremities, no joint.swelling or redness noted, has pain with flexion and extension at right ankle Neurological- alert and oriented to person,  place and time Skin- warm and dry Psychiatry- normal mood and affect    Labs reviewed: Basic Metabolic Panel:  Recent Labs  81/19/14 1107 03/27/16 0452  05/04/16 1007 05/05/16 0354 05/06/16 0350  NA 136 137  < > 134* 137 138  K 3.4* 4.0  < > 4.5 3.5 3.5  CL 101 102  < > 98* 103 107  CO2 27 28  < > 25 25 24   GLUCOSE 125* 110*  < > 129* 123* 139*  BUN 12 19  < > 18 21* 20  CREATININE 1.04 1.17  < > 0.98 1.10 0.85  CALCIUM 8.7* 8.5*  < > 8.8* 8.5* 8.0*  MG 2.0  --   --   --  2.1  --   PHOS  --  3.4  --   --   --   --   < > = values in this interval not displayed. Liver Function Tests:  Recent Labs  03/27/16 0452 05/04/16 1007 05/05/16 0354  AST  --  29 22  ALT  --  18 15*  ALKPHOS  --  45 42  BILITOT  --  2.4* 2.3*  PROT  --  8.1 7.3  ALBUMIN 3.7 3.9 3.5   No results for input(s): LIPASE, AMYLASE in the last 8760 hours.  Recent Labs  05/05/16 1651  AMMONIA <9*   CBC:  Recent Labs  02/04/16 0430 03/26/16 1107  05/04/16 1007 05/05/16 0354 05/06/16 0350  WBC 12.6* 14.6*  < > 17.2* 15.2* 12.2*  NEUTROABS 7.4 10.2*  --  14.4*  --   --   HGB 12.6* 14.9  < > 14.9 12.6* 11.4*  HCT 38.0* 41.7  < > 43.1 37.1* 33.2*  MCV 95.2 90.7  < > 93.3 93.2 93.0  PLT 221 193  < > 225 236 215  < > = values in this interval not displayed. Cardiac Enzymes:  Recent Labs  02/03/16 0849  TROPONINI <0.03   BNP: Invalid input(s): POCBNP CBG: No results for input(s): GLUCAP in the last 8760 hours.  Radiological Exams: Dg Chest 2 View  Result Date: 05/04/2016 CLINICAL DATA:  Altered mental status EXAM: CHEST  2 VIEW COMPARISON:  02/02/2016 FINDINGS: Cardiomediastinal silhouette is stable. There is streaky atelectasis, scarring or early infiltrate in lingula. No pulmonary edema. Mild right basilar atelectasis. Mild degenerative changes lower thoracic spine. IMPRESSION: Streaky atelectasis, scarring or early infiltrate in lingula. No pulmonary edema. Mild degenerative changes  thoracic spine. Electronically Signed   By: Natasha Mead M.D.   On: 05/04/2016 10:42   Ct Head Wo Contrast  Result Date: 05/04/2016 CLINICAL DATA:  Altered mental status, confusion. EXAM: CT HEAD WITHOUT CONTRAST TECHNIQUE: Contiguous axial images were obtained from the base of the skull through the vertex without intravenous contrast. COMPARISON:  None. FINDINGS: Brain: There is mild generalized age related parenchymal atrophy with commensurate dilatation of the sulci. There is moderate ventriculomegaly, slightly out of proportion to the degree of brain atrophy. Mild chronic small vessel ischemic changes noted within the bilateral periventricular and subcortical white matter regions. There is no mass, hemorrhage, edema or other evidence of acute parenchymal  abnormality. No extra-axial hemorrhage. Vascular: No hyperdense vessel or unexpected calcification. There are chronic calcified atherosclerotic changes of the large vessels at the skull base. Skull: Negative for fracture or focal lesion. Sinuses/Orbits: No acute findings. Other: None. IMPRESSION: 1. Moderate ventriculomegaly which is slightly out of proportion to the degree of underlying brain atrophy. This raises the possibility of a normal pressure hydrocephalus. 2. Chronic ischemic changes in the white matter. 3. No evidence of acute intracranial abnormality. No intracranial mass, hemorrhage or edema. Electronically Signed   By: Bary Richard M.D.   On: 05/04/2016 19:30   Dg Knee 2 Views Right  Result Date: 05/04/2016 CLINICAL DATA:  Right knee pain and swelling for 2 weeks. No known injury. EXAM: RIGHT KNEE - 3 VIEW COMPARISON:  None. FINDINGS: No evidence of fracture or dislocation. Alignment is maintained. Mild tricompartmental peripheral spurring and spurring of tibial spines. Joint spaces are preserved. No bony destructive change. There is a small joint effusion. Infrapatellar soft tissue prominence and probable edema. Vascular calcifications are  seen. IMPRESSION: 1. No acute bony abnormality.  Mild osteoarthritis. 2. Small joint effusion. 3. Subcutaneous infrapatellar soft tissue prominence and probable soft tissue edema, nonspecific. Electronically Signed   By: Rubye Oaks M.D.   On: 05/04/2016 18:14    Assessment/Plan   Generalized weakness From deconditioning. Will have him work with physical therapy and occupational therapy team to help with gait training and muscle strengthening exercises.fall precautions. Skin care. Encourage to be out of bed.   Urinary tract infection Continue and complete course of keflex 500 mg bid on 05/12/16. Continue foley care. Hydration encouraged  Leukocytosis Afebrile. Currently on antibiotics for UTI and was on prednisone for acute gout in hospital. Both of these could be contributing to elevated wbc. Monitor wbc  Urinary retention Continue flomax and continue foley catheter for now. To have voiding trial on 05/14/16 and will make urology appointment if this fails  Gout Complaints of right ankle pain on exam. No swelling or warmth on exam. Currently not on any gout medication. Check uric acid level in am. Start allopurinol 100 mg daily for chronic management. Patient would like to resume his black cherry supplement, this will be resumed.   ventriculomegaly There had been concern for possible NPH. Will have him follow with neurology    Goals of care: short term rehabilitation   Labs/tests ordered: cbc, cmp, uric acid level 05/12/16  Family/ staff Communication: reviewed care plan with patient and nursing supervisor    Oneal Grout, MD Internal Medicine [facility] [address] [place], [place] [address] Cell Phone (Monday-Friday 8 am - 5 pm): [phone] On Call: [phone] and follow prompts after 5 pm and on weekends Office Phone: [phone] Office Fax: [phone]

## 2016-05-12 LAB — BASIC METABOLIC PANEL
BUN: 14 mg/dL (ref 4–21)
Creatinine: 0.9 mg/dL (ref 0.6–1.3)
Glucose: 117 mg/dL
Potassium: 4.5 mmol/L (ref 3.4–5.3)
SODIUM: 138 mmol/L (ref 137–147)

## 2016-05-12 LAB — CBC AND DIFFERENTIAL
HEMATOCRIT: 43 % (ref 41–53)
HEMATOCRIT: 43 % (ref 41–53)
HEMOGLOBIN: 14.7 g/dL (ref 13.5–17.5)
HEMOGLOBIN: 14.7 g/dL (ref 13.5–17.5)
NEUTROS ABS: 10 /uL
PLATELETS: 310 10*3/uL (ref 150–399)
PLATELETS: 310 10*3/uL (ref 150–399)
WBC: 14.6 10*3/mL
WBC: 14.6 10^3/mL

## 2016-05-12 LAB — HEPATIC FUNCTION PANEL
ALK PHOS: 48 U/L (ref 25–125)
ALT: 34 U/L (ref 10–40)
AST: 25 U/L (ref 14–40)
BILIRUBIN, TOTAL: 0.8 mg/dL

## 2016-05-18 DIAGNOSIS — R339 Retention of urine, unspecified: Secondary | ICD-10-CM | POA: Diagnosis not present

## 2016-05-21 ENCOUNTER — Non-Acute Institutional Stay (SKILLED_NURSING_FACILITY): Payer: Medicare Other | Admitting: Family

## 2016-05-21 DIAGNOSIS — W19XXXS Unspecified fall, sequela: Secondary | ICD-10-CM | POA: Diagnosis not present

## 2016-05-21 DIAGNOSIS — R339 Retention of urine, unspecified: Secondary | ICD-10-CM | POA: Diagnosis not present

## 2016-05-21 NOTE — Progress Notes (Signed)
Location:   [facility]    Place of Service:  SNF (31) Provider:  Louretta Tantillo FNP-C   No PCP Per Patient  Patient Care Team: No Pcp Per Patient as PCP - General (General Practice)  Extended Emergency Contact Information Primary Emergency Contact: Erik [place] Mobile Phone: [phone] Relation: Friend Secondary Emergency Contact: [facility] Address: [address]          [facility] [place], [place] [address] [place] Home Phone: [phone] Relation: Friend  Code Status:  Full Code  Goals of care: Advanced Directive information Advanced Directives 05/04/2016  Does patient have an advance directive? No  Would patient like information on creating an advanced directive? No - patient declined information     Chief Complaint  Patient presents with  . Acute Visit    HPI:  Pt is a 75 y.o. male seen today at [facility] for an acute visit for evaluation of frequent voiding and recent multiple falls. He is seen in his room today. He denies any acute issues. Facility staff reports patient has been using the Urinal but also wets his diaper. He has had two fall episodes in the past week with no injuries. He was encouraged to call for assistance prior to getting up but patient tends to forget due to dementia.    Past Medical History:  Diagnosis Date  . Gout    Past Surgical History:  Procedure Laterality Date  . SKIN GRAFT Right     No Known Allergies    Medication List       Accurate as of 05/21/16 11:05 AM. Always use your most recent med list.          allopurinol 100 MG tablet Commonly known as:  ZYLOPRIM Take 100 mg by mouth daily.   BLACK CHERRY CONCENTRATE PO Take 1,000 mg by mouth daily.   feeding supplement (ENSURE ENLIVE) Liqd Take 237 mLs by mouth 2 (two) times daily between meals.   tamsulosin 0.4 MG Caps capsule Commonly known as:  FLOMAX Take 1 capsule (0.4 mg total) by  mouth daily after supper.       Review of Systems  Constitutional: Negative for activity change, appetite change, chills, fatigue and fever.  HENT: Negative for congestion, rhinorrhea, sinus pressure, sneezing and sore throat.   Eyes: Negative.   Respiratory: Negative for cough, chest tightness, shortness of breath and wheezing.   Cardiovascular: Negative for chest pain, palpitations and leg swelling.  Gastrointestinal: Negative for abdominal distention, abdominal pain, constipation, diarrhea, nausea and vomiting.  Endocrine: Negative for polydipsia, polyphagia and polyuria.  Genitourinary: Positive for frequency. Negative for difficulty urinating, dysuria, flank pain, hematuria and urgency.  Musculoskeletal: Positive for gait problem.  Skin: Negative for color change, pallor and rash.  Neurological: Negative for dizziness, seizures, syncope, light-headedness and headaches.  Psychiatric/Behavioral: Negative for agitation, confusion, hallucinations and sleep disturbance. The patient is not nervous/anxious.     Immunization History  Administered Date(s) Administered  . Pneumococcal Conjugate-13 02/04/2016   Pertinent  Health Maintenance Due  Topic Date Due  . INFLUENZA VACCINE  07/20/2016 (Originally 05/19/2016)  . PNA vac Low Risk Adult (2 of 2 - PPSV23) 02/03/2017   No flowsheet data found. Functional Status Survey:    Vitals:   05/21/16 1052  BP: (!) 143/76  Pulse: 77  Resp: 18  Temp: 98.5 F (36.9 C)  SpO2: 98%  Weight: 162 lb (73.5 kg)  Height: 5'  9" (1.753 m)   Body mass index is 23.92 kg/m. Physical Exam  Constitutional: He appears well-developed and well-nourished. No distress.  HENT:  Head: Normocephalic.  Mouth/Throat: Oropharynx is clear and moist. No oropharyngeal exudate.  Eyes: Conjunctivae and EOM are normal. Pupils are equal, round, and reactive to light. Right eye exhibits no discharge. Left eye exhibits no discharge. No scleral icterus.  Neck: Normal  range of motion. No JVD present. No thyromegaly present.  Cardiovascular: Normal rate, regular rhythm, normal heart sounds and intact distal pulses.  Exam reveals no gallop and no friction rub.   No murmur heard. Pulmonary/Chest: Effort normal and breath sounds normal. No respiratory distress. He has no wheezes. He has no rales.  Abdominal: Soft. Bowel sounds are normal. He exhibits no distension. There is no tenderness. There is no rebound and no guarding.  Genitourinary:  Genitourinary Comments: Uses urinal but incontinent at times.   Musculoskeletal: Normal range of motion. He exhibits no edema, tenderness or deformity.  Bilateral lower extremities weakness   Lymphadenopathy:    He has no cervical adenopathy.  Neurological: He is alert.  Skin: Skin is warm and dry. No rash noted. No erythema. No pallor.  Psychiatric: He has a normal mood and affect.    Labs reviewed:  Recent Labs  03/26/16 1107 03/27/16 0452  05/04/16 1007 05/05/16 0354 05/06/16 0350 05/12/16  NA 136 137  < > 134* 137 138 138  K 3.4* 4.0  < > 4.5 3.5 3.5 4.5  CL 101 102  < > 98* 103 107  --   CO2 27 28  < > 25 25 24   --   GLUCOSE 125* 110*  < > 129* 123* 139*  --   BUN 12 19  < > 18 21* 20 14  CREATININE 1.04 1.17  < > 0.98 1.10 0.85 0.9  CALCIUM 8.7* 8.5*  < > 8.8* 8.5* 8.0*  --   MG 2.0  --   --   --  2.1  --   --   PHOS  --  3.4  --   --   --   --   --   < > = values in this interval not displayed.  Recent Labs  03/27/16 0452 05/04/16 1007 05/05/16 0354 05/12/16  AST  --  29 22 25   ALT  --  18 15* 34  ALKPHOS  --  45 42 48  BILITOT  --  2.4* 2.3*  --   PROT  --  8.1 7.3  --   ALBUMIN 3.7 3.9 3.5  --     Recent Labs  03/26/16 1107  05/04/16 1007 05/05/16 0354 05/06/16 0350 05/12/16  WBC 14.6*  < > 17.2* 15.2* 12.2* 14.6  NEUTROABS 10.2*  --  14.4*  --   --  10  HGB 14.9  < > 14.9 12.6* 11.4* 14.7  HCT 41.7  < > 43.1 37.1* 33.2* 43  MCV 90.7  < > 93.3 93.2 93.0  --   PLT 193  < > 225  236 215 310  < > = values in this interval not displayed. Lab Results  Component Value Date   TSH 2.335 02/04/2016   Lab Results  Component Value Date   HGBA1C 5.7 (H) 03/28/2016   No results found for: CHOL, HDL, LDLCALC, LDLDIRECT, TRIG, CHOLHDL  Significant Diagnostic Results in last 30 days:  Dg Chest 2 View  Result Date: 05/04/2016 CLINICAL DATA:  Altered mental status EXAM: CHEST  2 VIEW COMPARISON:  02/02/2016 FINDINGS: Cardiomediastinal silhouette is stable. There is streaky atelectasis, scarring or early infiltrate in lingula. No pulmonary edema. Mild right basilar atelectasis. Mild degenerative changes lower thoracic spine. IMPRESSION: Streaky atelectasis, scarring or early infiltrate in lingula. No pulmonary edema. Mild degenerative changes thoracic spine. Electronically Signed   By: Natasha Mead M.D.   On: 05/04/2016 10:42   Ct Head Wo Contrast  Result Date: 05/04/2016 CLINICAL DATA:  Altered mental status, confusion. EXAM: CT HEAD WITHOUT CONTRAST TECHNIQUE: Contiguous axial images were obtained from the base of the skull through the vertex without intravenous contrast. COMPARISON:  None. FINDINGS: Brain: There is mild generalized age related parenchymal atrophy with commensurate dilatation of the sulci. There is moderate ventriculomegaly, slightly out of proportion to the degree of brain atrophy. Mild chronic small vessel ischemic changes noted within the bilateral periventricular and subcortical white matter regions. There is no mass, hemorrhage, edema or other evidence of acute parenchymal abnormality. No extra-axial hemorrhage. Vascular: No hyperdense vessel or unexpected calcification. There are chronic calcified atherosclerotic changes of the large vessels at the skull base. Skull: Negative for fracture or focal lesion. Sinuses/Orbits: No acute findings. Other: None. IMPRESSION: 1. Moderate ventriculomegaly which is slightly out of proportion to the degree of underlying brain  atrophy. This raises the possibility of a normal pressure hydrocephalus. 2. Chronic ischemic changes in the white matter. 3. No evidence of acute intracranial abnormality. No intracranial mass, hemorrhage or edema. Electronically Signed   By: Bary Richard M.D.   On: 05/04/2016 19:30   Dg Knee 2 Views Right  Result Date: 05/04/2016 CLINICAL DATA:  Right knee pain and swelling for 2 weeks. No known injury. EXAM: RIGHT KNEE - 3 VIEW COMPARISON:  None. FINDINGS: No evidence of fracture or dislocation. Alignment is maintained. Mild tricompartmental peripheral spurring and spurring of tibial spines. Joint spaces are preserved. No bony destructive change. There is a small joint effusion. Infrapatellar soft tissue prominence and probable edema. Vascular calcifications are seen. IMPRESSION: 1. No acute bony abnormality.  Mild osteoarthritis. 2. Small joint effusion. 3. Subcutaneous infrapatellar soft tissue prominence and probable soft tissue edema, nonspecific. Electronically Signed   By: Rubye Oaks M.D.   On: 05/04/2016 18:14    Assessment/Plan 1. Urinary retention  Foley Catheter D/c post hospital admission 05/04/2016-05/07/2016. Has had frequent voids on urinal and wet diaper. Obtain Urine specimen for U/A and C/S rule out UTI. Continue on Flomax 0.4 mcg Capsule. Obtain post void residual if > 300 cc insert foley Catheter and consult Urology for urinary retention.   2. Fall, sequela Has had X 2 episodes. Encourage to call for assistance. Fall and safety precautions.     Family/ staff Communication: Reviewed plan of care with patient and facility Nurse.  Labs/tests ordered:   Urine specimen for U/A and C/S.

## 2016-06-03 ENCOUNTER — Non-Acute Institutional Stay (SKILLED_NURSING_FACILITY): Payer: Medicare Other | Admitting: Family

## 2016-06-03 ENCOUNTER — Encounter: Payer: Self-pay | Admitting: Family

## 2016-06-03 DIAGNOSIS — R2681 Unsteadiness on feet: Secondary | ICD-10-CM | POA: Diagnosis not present

## 2016-06-03 DIAGNOSIS — N138 Other obstructive and reflux uropathy: Secondary | ICD-10-CM

## 2016-06-03 DIAGNOSIS — F0391 Unspecified dementia with behavioral disturbance: Secondary | ICD-10-CM

## 2016-06-03 DIAGNOSIS — N401 Enlarged prostate with lower urinary tract symptoms: Secondary | ICD-10-CM | POA: Diagnosis not present

## 2016-06-03 DIAGNOSIS — M1000 Idiopathic gout, unspecified site: Secondary | ICD-10-CM

## 2016-06-03 NOTE — Progress Notes (Signed)
Patient ID: Jacob Barajas, male   DOB: 1940-11-19, 75 y.o.   MRN: [id number]  Location:   [facility]  Nursing Home Room Number: 1203 Place of Service:  SNF (31)  Provider: Richarda Bladeinah Kamran Coker FNP-C   PCP: Patient Care Team: No Pcp Per Patient as PCP - General (General Practice)  Extended Emergency Contact Information Primary Emergency Contact: Erik ObeySmith,Linda  [place] Mobile Phone: [phone] Relation: Friend Secondary Emergency Contact: [facility] Hospital[facility]can,Mike Address: [address]          [facility] [place], [place] [address] [place] Home Phone: [phone] Relation: Friend  Code Status: Full Code  Goals of care:  Advanced Directive information Advanced Directives 05/04/2016  Does patient have an advance directive? No  Would patient like information on creating an advanced directive? No - patient declined information    No Known Allergies  Chief Complaint  Patient presents with  . Discharge Note   HPI:  75 y.o. male seen at [facility] for discharge home. He was here for for short term rehabilitation post hospital admission from 05/04/16-05/07/16 with metabolic encephalopathy from dehydration, acute right knee gout and urinary tract infection. His urine drug screen was negative.CT head had concerns for possible NPH and is pending outpatient neurology appointment. He received I.V fluids, antibiotics and solumedrol.He had a foley placed for urinary retention. He denies any acute issues this visit.He has worked well with PT/OT now stable for discharge home.He will be discharged home with Home health PT/OT to continue with ROM, Exercise, Gait stability and muscle strengthening.He will also need a HH Aide to assist with ADL's.He will require DME standard WC with Cushion, anti tippers, extended brake handles, removable elevating leg rests  to enable him to maintain current level of independence.He will also require a semi Electric Hospital bed  with  rails to allow transfer in and out of the bed to chair.Home health services will be arranged by facility social worker prior to discharge.Prescription medication will be written x 1 month then patient to follow up with PCP in 1-2 weeks. Facility staff report no new concerns.    Past Medical History:  Diagnosis Date  . Gout     Past Surgical History:  Procedure Laterality Date  . SKIN GRAFT Right       reports that he has quit smoking. He has never used smokeless tobacco. He reports that he does not drink alcohol. His drug history is not on file. Social History   Social History  . Marital status: Widowed    Spouse name: N/A  . Number of children: N/A  . Years of education: N/A   Occupational History  . Not on file.   Social History Main Topics  . Smoking status: Former Games developermoker  . Smokeless tobacco: Never Used  . Alcohol use No  . Drug use: Unknown  . Sexual activity: No   Other Topics Concern  . Not on file   Social History Narrative  . No narrative on file    No Known Allergies  Pertinent  Health Maintenance Due  Topic Date Due  . INFLUENZA VACCINE  07/20/2016 (Originally 05/19/2016)  . PNA vac Low Risk Adult (2 of 2 - PPSV23) 02/03/2017    Medications:   Medication List       Accurate as of 06/03/16 10:25 AM. Always use your most recent med list.          acetaminophen 500 MG tablet Commonly known as:  TYLENOL Take 1,000 mg by mouth every 6 (six) hours as needed for mild pain or moderate pain.   allopurinol 100 MG tablet Commonly known as:  ZYLOPRIM Take 100 mg by mouth daily.   BLACK CHERRY CONCENTRATE PO Take 1,000 mg by mouth daily.   tamsulosin 0.4 MG Caps capsule Commonly known as:  FLOMAX Take 1 capsule (0.4 mg total) by mouth daily after supper.   UNABLE TO FIND Med Pass 120 ml by mouth two times daily       Review of Systems  Constitutional: Negative for activity change, appetite change, chills, fatigue and fever.  HENT:  Negative for congestion, rhinorrhea, sinus pressure, sneezing and sore throat.   Eyes: Negative.   Respiratory: Negative for cough, chest tightness, shortness of breath and wheezing.   Cardiovascular: Negative for chest pain, palpitations and leg swelling.  Gastrointestinal: Negative for abdominal distention, abdominal pain, constipation, diarrhea, nausea and vomiting.  Endocrine: Negative for polydipsia, polyphagia and polyuria.  Genitourinary: Negative for difficulty urinating, dysuria, flank pain, frequency, hematuria and urgency.  Musculoskeletal: Positive for gait problem.  Skin: Negative for color change, pallor and rash.  Neurological: Negative for dizziness, seizures, syncope, light-headedness and headaches.  Hematological: Does not bruise/bleed easily.  Psychiatric/Behavioral: Negative for agitation, confusion, hallucinations and sleep disturbance. The patient is not nervous/anxious.     Vitals:   06/03/16 1015  BP: 129/67  Pulse: 84  Resp: 18  Temp: 98.6 F (37 C)  TempSrc: Oral  SpO2: 96%  Weight: 162 lb (73.5 kg)  Height: 5\' 9"  (1.753 m)   Body mass index is 23.92 kg/m. Physical Exam  Constitutional: He appears well-developed and well-nourished. No distress.  HENT:  Head: Normocephalic.  Mouth/Throat: Oropharynx is clear and moist. No oropharyngeal exudate.  Eyes: Conjunctivae and EOM are normal. Pupils are equal, round, and reactive to light. Right eye exhibits no discharge. Left eye exhibits no discharge. No scleral icterus.  Neck: Normal range of motion. No JVD present. No thyromegaly present.  Cardiovascular: Normal rate, regular rhythm, normal heart sounds and intact distal pulses.  Exam reveals no gallop and no friction rub.   No murmur heard. Pulmonary/Chest: Effort normal and breath sounds normal. No respiratory distress. He has no wheezes. He has no rales.  Abdominal: Soft. Bowel sounds are normal. He exhibits no distension. There is no tenderness. There is  no rebound and no guarding.  Genitourinary:  Genitourinary Comments:  incontinent at times.   Musculoskeletal: Normal range of motion. He exhibits no edema, tenderness or deformity.  Unsteady gait.Bilateral lower extremities weakness   Lymphadenopathy:    He has no cervical adenopathy.  Neurological: He is alert.  Skin: Skin is warm and dry. No rash noted. No erythema. No pallor.  Psychiatric: He has a normal mood and affect.    Labs reviewed: Basic Metabolic Panel:  Recent Labs  16/10/96 1107 03/27/16 0452  05/04/16 1007 05/05/16 0354 05/06/16 0350 05/12/16  NA 136 137  < > 134* 137 138 138  K 3.4* 4.0  < > 4.5 3.5 3.5 4.5  CL 101 102  < > 98* 103 107  --   CO2 27 28  < > 25 25 24   --   GLUCOSE 125* 110*  < > 129* 123* 139*  --   BUN 12 19  < > 18 21* 20 14  CREATININE 1.04 1.17  < > 0.98 1.10 0.85 0.9  CALCIUM 8.7* 8.5*  < > 8.8* 8.5* 8.0*  --   MG  2.0  --   --   --  2.1  --   --   PHOS  --  3.4  --   --   --   --   --   < > = values in this interval not displayed. Liver Function Tests:  Recent Labs  03/27/16 0452 05/04/16 1007 05/05/16 0354 05/12/16  AST  --  29 22 25   ALT  --  18 15* 34  ALKPHOS  --  45 42 48  BILITOT  --  2.4* 2.3*  --   PROT  --  8.1 7.3  --   ALBUMIN 3.7 3.9 3.5  --    CBC:  Recent Labs  03/26/16 1107  05/04/16 1007 05/05/16 0354 05/06/16 0350 05/12/16  WBC 14.6*  < > 17.2* 15.2* 12.2* 14.6  14.6  NEUTROABS 10.2*  --  14.4*  --   --  10  HGB 14.9  < > 14.9 12.6* 11.4* 14.7  14.7  HCT 41.7  < > 43.1 37.1* 33.2* 43  43  MCV 90.7  < > 93.3 93.2 93.0  --   PLT 193  < > 225 236 215 310  310  < > = values in this interval not displayed. Cardiac Enzymes:  Recent Labs  02/03/16 0849  TROPONINI <0.03   Assessment/Plan:   Unsteady gait  Has worked well with PT/ OT. Will discharge home PT/OT to continue with ROM, Exercise, Gait stability and muscle strengthening.He will require DME standard WC with Cushion, anti tippers, extended  brake handles, removable elevating leg rests  to enable him to maintain current level of independence.He will also require a semi Electric Hospital bed with  rails to allow transfer in and out of the bed to chair. Fall and safety precautions.  Gout  Status post short term rehabilitation post hospital admission from 05/04/16-05/07/16 with metabolic encephalopathy from dehydration, acute right knee gout Symptoms resolved. Continue on Allopurinol.   BPH Continue on Flomax.   Dementia with behavioral disturbance No new behavioral issues. He will need a HH Aide to assist with ADL's.encourage oral intake and hydration. Skin care.   Patient is being discharged with the following home health services:    -PT/OT to continue with ROM, Exercise, Gait stability and muscle strengthening. - HH Aide to assist with ADL's.  Patient is being discharged with the following durable medical equipment:    - standard WC with Cushion, anti tippers, extended brake handles, removable elevating leg rests  to enable him to maintain current level of independence.  - semi Electric Hospital bed with  rails to allow transfer in and out of the bed to chair.  Patient has been advised to f/u with their PCP in 1-2 weeks to bring them up to date on their rehab stay.  Social services at facility was responsible for arranging this appointment.  Pt was provided with a 30 day supply of prescriptions for medications and refills must be obtained from their PCP.  For controlled substances, a more limited supply may be provided adequate until PCP appointment only.  Future labs/tests needed: CBC, BMP in 1-2 weeks with PCP

## 2016-06-04 ENCOUNTER — Encounter: Payer: Self-pay | Admitting: Neurology

## 2016-06-04 ENCOUNTER — Ambulatory Visit (INDEPENDENT_AMBULATORY_CARE_PROVIDER_SITE_OTHER): Payer: Medicare Other | Admitting: Neurology

## 2016-06-04 VITALS — BP 126/76 | HR 75 | Temp 97.0°F | Ht 69.0 in

## 2016-06-04 DIAGNOSIS — F0390 Unspecified dementia without behavioral disturbance: Secondary | ICD-10-CM

## 2016-06-04 DIAGNOSIS — G9389 Other specified disorders of brain: Secondary | ICD-10-CM

## 2016-06-04 NOTE — Progress Notes (Signed)
[facility]    Provider:  Dr Lucia Gaskins Referring Provider: Oneal Grout, MD Primary Care Physician:  Oneal Grout, MD  CC:  Ventriculomegaly with concern for NPH per notes  HPI:  Jacob Barajas is a 75 y.o. male here as a referral from Dr. Glade Lloyd for ventriculomegaly with concern for NPH per physician orders in notes sent from [facility]. Patient has dementia,incontinence and is here alone today, no family members and no son, was just dropped off by [facility] alone. Patient is a poor historian and difficulty getting an accurate history. Per patient, previous to July he was  living at home and was alking just fine. He got gout and he had foot pain. He has not tried to walk lately. Not walking at all. He has been at [facility] for 2 months. He is going to be moving back home with his son soon. He denies any memory problems. No tremors. He has walking well before he went into the hospital. He denies confusion or agitation. He feels his legs are weak and this is impairing his walking. The weakness started a month ago. He feels improved. Patient denies incontinence or bowel changes. He denies dementia. He denies any other issues. Unknown family history of dementia. He has memory problems, diagnosed with dementia. cannot get a clear history today. For a patient like this, he should be accompanied by a family member or someone who knows him at the nursing facility.   Reviewed notes, labs and imaging from outside physicians, which showed:  TSH 2.3, aic 5.7, creatinine 0.9, B12 473, hiv and rpr neg  Reviewed notes from [facility]. Patient has had falls. Urination without difficulty however he was on a  catheter one point. Assistance needed with ADLs and transferring. Patient propels self in all dining room for meals. Urinating independently per recent notes. Does require 2 person assist to bathroom and with transfers. Requires constant redirection regarding transferring self from  wheelchair to bed. Patient has had gait training in July. Patient has had difficulty voiding has been seen by urology. Restless trying to get out of bed. Patient with intermittent confusion noted. He continues to utilize skilled therapy services. The son usually comes to appointments. Recently had a Foley cath which has been removed the most recent notes show that he is urinating independently without incontinence. He has had UTIs in the past unclear if this is why he has a Foley in. He has orthostatic hypotension. He received has had a PT and OT.  Personally reviewed CT images of the head and agree with the following except I do think that size of the ventricles may be appropriate to the amount of atrophy: Brain: There is mild generalized age related parenchymal atrophy with commensurate dilatation of the sulci. There is moderate ventriculomegaly, slightly out of proportion to the degree of brain atrophy.  Mild chronic small vessel ischemic changes noted within the bilateral periventricular and subcortical white matter regions. There is no mass, hemorrhage, edema or other evidence of acute parenchymal abnormality. No extra-axial hemorrhage.  Vascular: No hyperdense vessel or unexpected calcification. There are chronic calcified atherosclerotic changes of the large vessels at the skull base.  Skull: Negative for fracture or focal lesion.  Sinuses/Orbits: No acute findings.  Other: None.  IMPRESSION: 1. Moderate ventriculomegaly which is slightly out of proportion to the degree of underlying brain atrophy. This raises the possibility of a normal pressure hydrocephalus. 2. Chronic ischemic changes in the white matter. 3. No evidence of acute  intracranial abnormality. No intracranial mass, hemorrhage or edema.   Review of Systems: Patient complains of symptoms per HPI as well as the following symptoms: No CP, no SOB. Pertinent negatives per HPI. All others negative.   Social History    Social History  . Marital status: Widowed    Spouse name: N/A  . Number of children: 1  . Years of education: 8   Occupational History  . REtired     Social History Main Topics  . Smoking status: Former Games developermoker  . Smokeless tobacco: Never Used  . Alcohol use No  . Drug use: No  . Sexual activity: No   Other Topics Concern  . Not on file   Social History Narrative   [facility]ives at [facility]    Res No: [id number]   Unit room bed: B1203 P   Admit date 05/07/16   Drinks coffee daily (decaf)    Family History  Problem Relation Age of Onset  . Heart disease Brother   . Cancer Sister     "throat" cancer    Past Medical History:  Diagnosis Date  . Dementia   . Gout     Past Surgical History:  Procedure Laterality Date  . SKIN GRAFT Right     Current Outpatient Prescriptions  Medication Sig Dispense Refill  . acetaminophen (TYLENOL) 500 MG tablet Take 500 mg by mouth every 6 (six) hours as needed for mild pain or moderate pain.     Marland Kitchen. allopurinol (ZYLOPRIM) 100 MG tablet Take 100 mg by mouth daily.    . Misc Natural Products (BLACK CHERRY CONCENTRATE PO) Take 1,000 mg by mouth daily. For Gout    . tamsulosin (FLOMAX) 0.4 MG CAPS capsule Take 1 capsule (0.4 mg total) by mouth daily after supper. 30 capsule 0  . UNABLE TO FIND Take 1 Dose by mouth 2 (two) times daily. Med Pass 120 ml by mouth two times daily      No current facility-administered medications for this visit.     Allergies as of 06/04/2016  . (No Known Allergies)    Vitals: BP 126/76 (BP Location: Left Arm, Patient Position: Sitting, Cuff Size: Normal)   Pulse 75   Temp 97 F (36.1 C) (Oral)   Ht 5\' 9"  (1.753 m)  Last Weight:  Wt Readings from Last 1 Encounters:  06/03/16 162 lb (73.5 kg)   Last Height:   Ht Readings from Last 1 Encounters:  06/04/16 5\' 9"  (1.753 m)    Physical exam: Exam: Gen: NAD              CV: RRR, no MRG. No Carotid Bruits. No peripheral edema, warm,  nontender Eyes: Conjunctivae clear without exudates or hemorrhage  Neuro: Detailed Neurologic Exam  Speech:    Speech is normal; fluent and spontaneous with normal comprehension.  Cognition:    The patient is oriented to person     recent and remote memory impaired    language Without aphasia or dysarthria;     normal attention, concentration,     fund of knowledge - can't tell me who the president is, impaired   Cranial Nerves:    The pupils are equal, round, and reactive to light. Attempted funduscopic exam could not visualize due to small pupils. Visual fields are full to finger confrontation. Extraocular movements are intact. Trigeminal sensation is intact and the muscles of mastication are normal. The face is symmetric. The palate elevates in the midline. Hearing intact. Voice  is normal. Shoulder shrug is normal. The tongue has normal motion without fasciculations.   Coordination:    No dysmetria  Gait:    Attempted, cant stand unassisted or take steps.  Motor Observation:   no involuntary movements noted, no tremor noted. Tone:    Normal muscle tone.  No increased tone or cogwheeling.  Posture:    stooped    Strength: right knee with pain so some giveway there but overall strength intact possibly mild bilat proximal LE symmetric weakness.         Sensation: decreased to all modalities distally     Reflex Exam:  DTR's:    Deep tendon reflexes in the upper and lower extremities are brisk bilaterally for age and medical conditions.   Toes:    The toes are equivocal bilaterally.   Clonus:    Clonus is absent.      Assessment/Plan:  75 year old patient with dementia here for evaluation of ventriculomegaly seen on recent CT of the head raising the possibility of NPH.  - Patient has significant dementia and is here alone today, no family members and no son, was just dropped off by [facility].Transportation at [facility] should have been instructed not to  leave patient until family was in attendance. Will not see patient in the office again without family member.  - Need to have a discussion with the son in the office. [facility] will be instructed to get in contact with the son and schedule an appointment with the son when he can come to. Feel as though his ventriculomegaly may be appropriate for the amount of atrophy however need more history from son. Also need to discuss workup for NPH which is extensive to evaluate for NPH including high volume lumbar tap and close observation over the next 72 hours for improvement of gait and memory. - Follow up in the next 3 months with a family member. Again will not see patient in the office without a family member.   Naomie DeanAntonia Mariluz Crespo, MD  [facility] [address] [place], [place] [id number]  Phone [phone] Fax [phone]

## 2016-06-09 DIAGNOSIS — R531 Weakness: Secondary | ICD-10-CM | POA: Diagnosis not present

## 2016-06-09 DIAGNOSIS — F0390 Unspecified dementia without behavioral disturbance: Secondary | ICD-10-CM | POA: Diagnosis not present

## 2016-06-09 DIAGNOSIS — M25561 Pain in right knee: Secondary | ICD-10-CM | POA: Diagnosis not present

## 2016-06-09 DIAGNOSIS — M1711 Unilateral primary osteoarthritis, right knee: Secondary | ICD-10-CM | POA: Diagnosis not present

## 2016-06-09 DIAGNOSIS — R2681 Unsteadiness on feet: Secondary | ICD-10-CM | POA: Diagnosis not present

## 2016-06-09 DIAGNOSIS — M10071 Idiopathic gout, right ankle and foot: Secondary | ICD-10-CM | POA: Diagnosis not present

## 2016-06-14 ENCOUNTER — Encounter: Payer: Self-pay | Admitting: Neurology

## 2016-06-14 DIAGNOSIS — F0390 Unspecified dementia without behavioral disturbance: Secondary | ICD-10-CM | POA: Insufficient documentation

## 2016-06-14 NOTE — Patient Instructions (Signed)
  Needs follow-up in the office with his son.

## 2016-06-15 DIAGNOSIS — F0390 Unspecified dementia without behavioral disturbance: Secondary | ICD-10-CM | POA: Diagnosis not present

## 2016-06-15 DIAGNOSIS — M10071 Idiopathic gout, right ankle and foot: Secondary | ICD-10-CM | POA: Diagnosis not present

## 2016-06-15 DIAGNOSIS — M25561 Pain in right knee: Secondary | ICD-10-CM | POA: Diagnosis not present

## 2016-06-15 DIAGNOSIS — M1711 Unilateral primary osteoarthritis, right knee: Secondary | ICD-10-CM | POA: Diagnosis not present

## 2016-06-15 DIAGNOSIS — R2681 Unsteadiness on feet: Secondary | ICD-10-CM | POA: Diagnosis not present

## 2016-06-15 DIAGNOSIS — R531 Weakness: Secondary | ICD-10-CM | POA: Diagnosis not present

## 2016-06-16 ENCOUNTER — Encounter: Payer: Self-pay | Admitting: *Deleted

## 2016-06-16 DIAGNOSIS — M25561 Pain in right knee: Secondary | ICD-10-CM | POA: Diagnosis not present

## 2016-06-16 DIAGNOSIS — R531 Weakness: Secondary | ICD-10-CM | POA: Diagnosis not present

## 2016-06-16 DIAGNOSIS — M1711 Unilateral primary osteoarthritis, right knee: Secondary | ICD-10-CM | POA: Diagnosis not present

## 2016-06-16 DIAGNOSIS — M10071 Idiopathic gout, right ankle and foot: Secondary | ICD-10-CM | POA: Diagnosis not present

## 2016-06-16 DIAGNOSIS — R2681 Unsteadiness on feet: Secondary | ICD-10-CM | POA: Diagnosis not present

## 2016-06-16 DIAGNOSIS — F0390 Unspecified dementia without behavioral disturbance: Secondary | ICD-10-CM | POA: Diagnosis not present

## 2016-06-16 NOTE — Progress Notes (Signed)
Faxed Dr Trevor MaceAhern's recent office note to [facility] per Dr Lucia GaskinsAhern request. Fax: [phone]. Received confirmation.

## 2016-06-17 DIAGNOSIS — R2681 Unsteadiness on feet: Secondary | ICD-10-CM | POA: Diagnosis not present

## 2016-06-17 DIAGNOSIS — R531 Weakness: Secondary | ICD-10-CM | POA: Diagnosis not present

## 2016-06-17 DIAGNOSIS — M25561 Pain in right knee: Secondary | ICD-10-CM | POA: Diagnosis not present

## 2016-06-17 DIAGNOSIS — M1711 Unilateral primary osteoarthritis, right knee: Secondary | ICD-10-CM | POA: Diagnosis not present

## 2016-06-17 DIAGNOSIS — F0390 Unspecified dementia without behavioral disturbance: Secondary | ICD-10-CM | POA: Diagnosis not present

## 2016-06-17 DIAGNOSIS — M10071 Idiopathic gout, right ankle and foot: Secondary | ICD-10-CM | POA: Diagnosis not present

## 2016-06-18 DIAGNOSIS — M10071 Idiopathic gout, right ankle and foot: Secondary | ICD-10-CM | POA: Diagnosis not present

## 2016-06-18 DIAGNOSIS — R2681 Unsteadiness on feet: Secondary | ICD-10-CM | POA: Diagnosis not present

## 2016-06-18 DIAGNOSIS — R531 Weakness: Secondary | ICD-10-CM | POA: Diagnosis not present

## 2016-06-18 DIAGNOSIS — M25561 Pain in right knee: Secondary | ICD-10-CM | POA: Diagnosis not present

## 2016-06-18 DIAGNOSIS — M1711 Unilateral primary osteoarthritis, right knee: Secondary | ICD-10-CM | POA: Diagnosis not present

## 2016-06-18 DIAGNOSIS — F0390 Unspecified dementia without behavioral disturbance: Secondary | ICD-10-CM | POA: Diagnosis not present

## 2016-06-19 DIAGNOSIS — M10071 Idiopathic gout, right ankle and foot: Secondary | ICD-10-CM | POA: Diagnosis not present

## 2016-06-19 DIAGNOSIS — R2681 Unsteadiness on feet: Secondary | ICD-10-CM | POA: Diagnosis not present

## 2016-06-19 DIAGNOSIS — M1711 Unilateral primary osteoarthritis, right knee: Secondary | ICD-10-CM | POA: Diagnosis not present

## 2016-06-19 DIAGNOSIS — F0390 Unspecified dementia without behavioral disturbance: Secondary | ICD-10-CM | POA: Diagnosis not present

## 2016-06-19 DIAGNOSIS — R531 Weakness: Secondary | ICD-10-CM | POA: Diagnosis not present

## 2016-06-19 DIAGNOSIS — M25561 Pain in right knee: Secondary | ICD-10-CM | POA: Diagnosis not present

## 2016-06-23 DIAGNOSIS — M25561 Pain in right knee: Secondary | ICD-10-CM | POA: Diagnosis not present

## 2016-06-23 DIAGNOSIS — R2681 Unsteadiness on feet: Secondary | ICD-10-CM | POA: Diagnosis not present

## 2016-06-23 DIAGNOSIS — R531 Weakness: Secondary | ICD-10-CM | POA: Diagnosis not present

## 2016-06-23 DIAGNOSIS — M1711 Unilateral primary osteoarthritis, right knee: Secondary | ICD-10-CM | POA: Diagnosis not present

## 2016-06-23 DIAGNOSIS — M10071 Idiopathic gout, right ankle and foot: Secondary | ICD-10-CM | POA: Diagnosis not present

## 2016-06-23 DIAGNOSIS — F0390 Unspecified dementia without behavioral disturbance: Secondary | ICD-10-CM | POA: Diagnosis not present

## 2016-06-30 DIAGNOSIS — M1711 Unilateral primary osteoarthritis, right knee: Secondary | ICD-10-CM | POA: Diagnosis not present

## 2016-06-30 DIAGNOSIS — R531 Weakness: Secondary | ICD-10-CM | POA: Diagnosis not present

## 2016-06-30 DIAGNOSIS — M10071 Idiopathic gout, right ankle and foot: Secondary | ICD-10-CM | POA: Diagnosis not present

## 2016-06-30 DIAGNOSIS — F0390 Unspecified dementia without behavioral disturbance: Secondary | ICD-10-CM | POA: Diagnosis not present

## 2016-06-30 DIAGNOSIS — M25561 Pain in right knee: Secondary | ICD-10-CM | POA: Diagnosis not present

## 2016-06-30 DIAGNOSIS — R2681 Unsteadiness on feet: Secondary | ICD-10-CM | POA: Diagnosis not present

## 2016-07-01 DIAGNOSIS — F0390 Unspecified dementia without behavioral disturbance: Secondary | ICD-10-CM | POA: Diagnosis not present

## 2016-07-01 DIAGNOSIS — M1711 Unilateral primary osteoarthritis, right knee: Secondary | ICD-10-CM | POA: Diagnosis not present

## 2016-07-01 DIAGNOSIS — M10071 Idiopathic gout, right ankle and foot: Secondary | ICD-10-CM | POA: Diagnosis not present

## 2016-07-01 DIAGNOSIS — R531 Weakness: Secondary | ICD-10-CM | POA: Diagnosis not present

## 2016-07-01 DIAGNOSIS — M25561 Pain in right knee: Secondary | ICD-10-CM | POA: Diagnosis not present

## 2016-07-01 DIAGNOSIS — R2681 Unsteadiness on feet: Secondary | ICD-10-CM | POA: Diagnosis not present

## 2016-07-02 DIAGNOSIS — M10071 Idiopathic gout, right ankle and foot: Secondary | ICD-10-CM | POA: Diagnosis not present

## 2016-07-02 DIAGNOSIS — M1711 Unilateral primary osteoarthritis, right knee: Secondary | ICD-10-CM | POA: Diagnosis not present

## 2016-07-02 DIAGNOSIS — F0390 Unspecified dementia without behavioral disturbance: Secondary | ICD-10-CM | POA: Diagnosis not present

## 2016-07-02 DIAGNOSIS — M25561 Pain in right knee: Secondary | ICD-10-CM | POA: Diagnosis not present

## 2016-07-02 DIAGNOSIS — R2681 Unsteadiness on feet: Secondary | ICD-10-CM | POA: Diagnosis not present

## 2016-07-02 DIAGNOSIS — R531 Weakness: Secondary | ICD-10-CM | POA: Diagnosis not present

## 2016-07-14 DIAGNOSIS — M25561 Pain in right knee: Secondary | ICD-10-CM | POA: Diagnosis not present

## 2016-07-14 DIAGNOSIS — F0390 Unspecified dementia without behavioral disturbance: Secondary | ICD-10-CM | POA: Diagnosis not present

## 2016-07-14 DIAGNOSIS — R531 Weakness: Secondary | ICD-10-CM | POA: Diagnosis not present

## 2016-07-14 DIAGNOSIS — R2681 Unsteadiness on feet: Secondary | ICD-10-CM | POA: Diagnosis not present

## 2016-07-14 DIAGNOSIS — M10071 Idiopathic gout, right ankle and foot: Secondary | ICD-10-CM | POA: Diagnosis not present

## 2016-07-14 DIAGNOSIS — M1711 Unilateral primary osteoarthritis, right knee: Secondary | ICD-10-CM | POA: Diagnosis not present

## 2016-08-11 DIAGNOSIS — M25561 Pain in right knee: Secondary | ICD-10-CM | POA: Diagnosis not present

## 2016-08-11 DIAGNOSIS — G8929 Other chronic pain: Secondary | ICD-10-CM | POA: Diagnosis not present

## 2016-08-11 DIAGNOSIS — M25562 Pain in left knee: Secondary | ICD-10-CM | POA: Diagnosis not present

## 2017-01-18 DIAGNOSIS — Z993 Dependence on wheelchair: Secondary | ICD-10-CM | POA: Diagnosis not present

## 2017-01-18 DIAGNOSIS — R451 Restlessness and agitation: Secondary | ICD-10-CM | POA: Diagnosis not present

## 2017-01-18 DIAGNOSIS — M10071 Idiopathic gout, right ankle and foot: Secondary | ICD-10-CM | POA: Diagnosis not present

## 2017-01-18 DIAGNOSIS — M24561 Contracture, right knee: Secondary | ICD-10-CM | POA: Diagnosis not present

## 2017-01-18 DIAGNOSIS — M24562 Contracture, left knee: Secondary | ICD-10-CM | POA: Diagnosis not present

## 2017-01-18 DIAGNOSIS — M150 Primary generalized (osteo)arthritis: Secondary | ICD-10-CM | POA: Diagnosis not present

## 2017-01-18 DIAGNOSIS — G309 Alzheimer's disease, unspecified: Secondary | ICD-10-CM | POA: Diagnosis not present

## 2017-01-18 DIAGNOSIS — D519 Vitamin B12 deficiency anemia, unspecified: Secondary | ICD-10-CM | POA: Diagnosis not present

## 2017-01-18 DIAGNOSIS — R351 Nocturia: Secondary | ICD-10-CM | POA: Diagnosis not present

## 2017-01-20 DIAGNOSIS — G309 Alzheimer's disease, unspecified: Secondary | ICD-10-CM | POA: Diagnosis not present

## 2017-01-20 DIAGNOSIS — M1990 Unspecified osteoarthritis, unspecified site: Secondary | ICD-10-CM | POA: Diagnosis not present

## 2017-01-20 DIAGNOSIS — R2689 Other abnormalities of gait and mobility: Secondary | ICD-10-CM | POA: Diagnosis not present

## 2017-01-20 DIAGNOSIS — M109 Gout, unspecified: Secondary | ICD-10-CM | POA: Diagnosis not present

## 2017-01-20 DIAGNOSIS — F0280 Dementia in other diseases classified elsewhere without behavioral disturbance: Secondary | ICD-10-CM | POA: Diagnosis not present

## 2017-01-20 DIAGNOSIS — R32 Unspecified urinary incontinence: Secondary | ICD-10-CM | POA: Diagnosis not present

## 2017-03-05 DIAGNOSIS — M109 Gout, unspecified: Secondary | ICD-10-CM | POA: Diagnosis not present

## 2017-03-05 DIAGNOSIS — M1711 Unilateral primary osteoarthritis, right knee: Secondary | ICD-10-CM | POA: Diagnosis not present

## 2017-03-05 DIAGNOSIS — D72829 Elevated white blood cell count, unspecified: Secondary | ICD-10-CM | POA: Diagnosis not present

## 2017-03-05 DIAGNOSIS — M1000 Idiopathic gout, unspecified site: Secondary | ICD-10-CM | POA: Diagnosis not present

## 2017-04-05 DIAGNOSIS — D72829 Elevated white blood cell count, unspecified: Secondary | ICD-10-CM | POA: Diagnosis not present

## 2017-04-05 DIAGNOSIS — M109 Gout, unspecified: Secondary | ICD-10-CM | POA: Diagnosis not present

## 2017-04-05 DIAGNOSIS — M1000 Idiopathic gout, unspecified site: Secondary | ICD-10-CM | POA: Diagnosis not present

## 2017-04-05 DIAGNOSIS — M1711 Unilateral primary osteoarthritis, right knee: Secondary | ICD-10-CM | POA: Diagnosis not present

## 2017-05-05 DIAGNOSIS — D72829 Elevated white blood cell count, unspecified: Secondary | ICD-10-CM | POA: Diagnosis not present

## 2017-05-05 DIAGNOSIS — M1000 Idiopathic gout, unspecified site: Secondary | ICD-10-CM | POA: Diagnosis not present

## 2017-05-05 DIAGNOSIS — M1711 Unilateral primary osteoarthritis, right knee: Secondary | ICD-10-CM | POA: Diagnosis not present

## 2017-05-05 DIAGNOSIS — M109 Gout, unspecified: Secondary | ICD-10-CM | POA: Diagnosis not present

## 2017-06-05 DIAGNOSIS — M1000 Idiopathic gout, unspecified site: Secondary | ICD-10-CM | POA: Diagnosis not present

## 2017-06-05 DIAGNOSIS — D72829 Elevated white blood cell count, unspecified: Secondary | ICD-10-CM | POA: Diagnosis not present

## 2017-06-05 DIAGNOSIS — M1711 Unilateral primary osteoarthritis, right knee: Secondary | ICD-10-CM | POA: Diagnosis not present

## 2017-06-05 DIAGNOSIS — M109 Gout, unspecified: Secondary | ICD-10-CM | POA: Diagnosis not present

## 2017-07-06 DIAGNOSIS — D72829 Elevated white blood cell count, unspecified: Secondary | ICD-10-CM | POA: Diagnosis not present

## 2017-07-06 DIAGNOSIS — M1000 Idiopathic gout, unspecified site: Secondary | ICD-10-CM | POA: Diagnosis not present

## 2017-07-06 DIAGNOSIS — M109 Gout, unspecified: Secondary | ICD-10-CM | POA: Diagnosis not present

## 2017-07-06 DIAGNOSIS — M1711 Unilateral primary osteoarthritis, right knee: Secondary | ICD-10-CM | POA: Diagnosis not present

## 2017-08-05 DIAGNOSIS — M109 Gout, unspecified: Secondary | ICD-10-CM | POA: Diagnosis not present

## 2017-08-05 DIAGNOSIS — M1000 Idiopathic gout, unspecified site: Secondary | ICD-10-CM | POA: Diagnosis not present

## 2017-08-05 DIAGNOSIS — M1711 Unilateral primary osteoarthritis, right knee: Secondary | ICD-10-CM | POA: Diagnosis not present

## 2017-08-05 DIAGNOSIS — D72829 Elevated white blood cell count, unspecified: Secondary | ICD-10-CM | POA: Diagnosis not present

## 2017-08-09 ENCOUNTER — Inpatient Hospital Stay (HOSPITAL_COMMUNITY): Payer: PPO

## 2017-08-09 ENCOUNTER — Encounter (HOSPITAL_COMMUNITY): Payer: Self-pay | Admitting: Emergency Medicine

## 2017-08-09 ENCOUNTER — Emergency Department (HOSPITAL_COMMUNITY): Payer: PPO

## 2017-08-09 ENCOUNTER — Encounter (HOSPITAL_COMMUNITY)
Admission: EM | Disposition: A | Discharge status: Skilled Nursing Facility | Payer: Self-pay | Source: Home / Self Care | Attending: Internal Medicine

## 2017-08-09 ENCOUNTER — Inpatient Hospital Stay (HOSPITAL_COMMUNITY)
Admission: EM | Admit: 2017-08-09 | Discharge: 2017-08-15 | DRG: 871 | Disposition: A | Discharge status: Skilled Nursing Facility | Payer: PPO | Attending: Internal Medicine | Admitting: Internal Medicine

## 2017-08-09 DIAGNOSIS — D649 Anemia, unspecified: Secondary | ICD-10-CM | POA: Diagnosis present

## 2017-08-09 DIAGNOSIS — I340 Nonrheumatic mitral (valve) insufficiency: Secondary | ICD-10-CM | POA: Diagnosis not present

## 2017-08-09 DIAGNOSIS — R05 Cough: Secondary | ICD-10-CM | POA: Diagnosis not present

## 2017-08-09 DIAGNOSIS — R278 Other lack of coordination: Secondary | ICD-10-CM | POA: Diagnosis not present

## 2017-08-09 DIAGNOSIS — Z79899 Other long term (current) drug therapy: Secondary | ICD-10-CM | POA: Diagnosis not present

## 2017-08-09 DIAGNOSIS — E872 Acidosis: Secondary | ICD-10-CM | POA: Diagnosis present

## 2017-08-09 DIAGNOSIS — M109 Gout, unspecified: Secondary | ICD-10-CM | POA: Diagnosis not present

## 2017-08-09 DIAGNOSIS — R112 Nausea with vomiting, unspecified: Secondary | ICD-10-CM | POA: Diagnosis present

## 2017-08-09 DIAGNOSIS — J9601 Acute respiratory failure with hypoxia: Secondary | ICD-10-CM | POA: Diagnosis present

## 2017-08-09 DIAGNOSIS — R740 Nonspecific elevation of levels of transaminase and lactic acid dehydrogenase [LDH]: Secondary | ICD-10-CM

## 2017-08-09 DIAGNOSIS — Z515 Encounter for palliative care: Secondary | ICD-10-CM

## 2017-08-09 DIAGNOSIS — R0600 Dyspnea, unspecified: Secondary | ICD-10-CM | POA: Diagnosis not present

## 2017-08-09 DIAGNOSIS — J189 Pneumonia, unspecified organism: Secondary | ICD-10-CM | POA: Diagnosis not present

## 2017-08-09 DIAGNOSIS — R570 Cardiogenic shock: Secondary | ICD-10-CM | POA: Diagnosis present

## 2017-08-09 DIAGNOSIS — E871 Hypo-osmolality and hyponatremia: Secondary | ICD-10-CM

## 2017-08-09 DIAGNOSIS — I214 Non-ST elevation (NSTEMI) myocardial infarction: Secondary | ICD-10-CM | POA: Diagnosis present

## 2017-08-09 DIAGNOSIS — I5043 Acute on chronic combined systolic (congestive) and diastolic (congestive) heart failure: Secondary | ICD-10-CM | POA: Diagnosis not present

## 2017-08-09 DIAGNOSIS — Z23 Encounter for immunization: Secondary | ICD-10-CM

## 2017-08-09 DIAGNOSIS — Z7189 Other specified counseling: Secondary | ICD-10-CM

## 2017-08-09 DIAGNOSIS — J9602 Acute respiratory failure with hypercapnia: Secondary | ICD-10-CM

## 2017-08-09 DIAGNOSIS — Z87891 Personal history of nicotine dependence: Secondary | ICD-10-CM

## 2017-08-09 DIAGNOSIS — J9690 Respiratory failure, unspecified, unspecified whether with hypoxia or hypercapnia: Secondary | ICD-10-CM | POA: Diagnosis not present

## 2017-08-09 DIAGNOSIS — E785 Hyperlipidemia, unspecified: Secondary | ICD-10-CM | POA: Diagnosis present

## 2017-08-09 DIAGNOSIS — Z66 Do not resuscitate: Secondary | ICD-10-CM

## 2017-08-09 DIAGNOSIS — I2510 Atherosclerotic heart disease of native coronary artery without angina pectoris: Secondary | ICD-10-CM | POA: Diagnosis not present

## 2017-08-09 DIAGNOSIS — J129 Viral pneumonia, unspecified: Secondary | ICD-10-CM | POA: Diagnosis not present

## 2017-08-09 DIAGNOSIS — M6281 Muscle weakness (generalized): Secondary | ICD-10-CM | POA: Diagnosis not present

## 2017-08-09 DIAGNOSIS — R1111 Vomiting without nausea: Secondary | ICD-10-CM | POA: Diagnosis not present

## 2017-08-09 DIAGNOSIS — B9789 Other viral agents as the cause of diseases classified elsewhere: Secondary | ICD-10-CM | POA: Diagnosis not present

## 2017-08-09 DIAGNOSIS — I509 Heart failure, unspecified: Secondary | ICD-10-CM | POA: Diagnosis not present

## 2017-08-09 DIAGNOSIS — Z5189 Encounter for other specified aftercare: Secondary | ICD-10-CM | POA: Diagnosis not present

## 2017-08-09 DIAGNOSIS — M1000 Idiopathic gout, unspecified site: Secondary | ICD-10-CM | POA: Diagnosis not present

## 2017-08-09 DIAGNOSIS — F0391 Unspecified dementia with behavioral disturbance: Secondary | ICD-10-CM | POA: Diagnosis not present

## 2017-08-09 DIAGNOSIS — Z808 Family history of malignant neoplasm of other organs or systems: Secondary | ICD-10-CM

## 2017-08-09 DIAGNOSIS — N179 Acute kidney failure, unspecified: Secondary | ICD-10-CM | POA: Diagnosis present

## 2017-08-09 DIAGNOSIS — R140 Abdominal distension (gaseous): Secondary | ICD-10-CM | POA: Diagnosis not present

## 2017-08-09 DIAGNOSIS — F0390 Unspecified dementia without behavioral disturbance: Secondary | ICD-10-CM | POA: Diagnosis not present

## 2017-08-09 DIAGNOSIS — R509 Fever, unspecified: Secondary | ICD-10-CM | POA: Diagnosis present

## 2017-08-09 DIAGNOSIS — R7401 Elevation of levels of liver transaminase levels: Secondary | ICD-10-CM

## 2017-08-09 DIAGNOSIS — R488 Other symbolic dysfunctions: Secondary | ICD-10-CM | POA: Diagnosis not present

## 2017-08-09 DIAGNOSIS — K219 Gastro-esophageal reflux disease without esophagitis: Secondary | ICD-10-CM | POA: Diagnosis present

## 2017-08-09 DIAGNOSIS — R778 Other specified abnormalities of plasma proteins: Secondary | ICD-10-CM

## 2017-08-09 DIAGNOSIS — I5041 Acute combined systolic (congestive) and diastolic (congestive) heart failure: Secondary | ICD-10-CM | POA: Diagnosis not present

## 2017-08-09 DIAGNOSIS — R61 Generalized hyperhidrosis: Secondary | ICD-10-CM | POA: Diagnosis not present

## 2017-08-09 DIAGNOSIS — Z8249 Family history of ischemic heart disease and other diseases of the circulatory system: Secondary | ICD-10-CM

## 2017-08-09 DIAGNOSIS — E860 Dehydration: Secondary | ICD-10-CM | POA: Diagnosis not present

## 2017-08-09 DIAGNOSIS — R748 Abnormal levels of other serum enzymes: Secondary | ICD-10-CM | POA: Diagnosis not present

## 2017-08-09 DIAGNOSIS — I5032 Chronic diastolic (congestive) heart failure: Secondary | ICD-10-CM | POA: Diagnosis not present

## 2017-08-09 DIAGNOSIS — R7989 Other specified abnormal findings of blood chemistry: Secondary | ICD-10-CM | POA: Diagnosis not present

## 2017-08-09 DIAGNOSIS — A419 Sepsis, unspecified organism: Principal | ICD-10-CM | POA: Diagnosis present

## 2017-08-09 DIAGNOSIS — J40 Bronchitis, not specified as acute or chronic: Secondary | ICD-10-CM | POA: Diagnosis present

## 2017-08-09 DIAGNOSIS — J9600 Acute respiratory failure, unspecified whether with hypoxia or hypercapnia: Secondary | ICD-10-CM | POA: Diagnosis not present

## 2017-08-09 DIAGNOSIS — I25110 Atherosclerotic heart disease of native coronary artery with unstable angina pectoris: Secondary | ICD-10-CM | POA: Diagnosis not present

## 2017-08-09 DIAGNOSIS — B348 Other viral infections of unspecified site: Secondary | ICD-10-CM | POA: Diagnosis not present

## 2017-08-09 DIAGNOSIS — R6520 Severe sepsis without septic shock: Secondary | ICD-10-CM | POA: Diagnosis not present

## 2017-08-09 DIAGNOSIS — R410 Disorientation, unspecified: Secondary | ICD-10-CM | POA: Diagnosis not present

## 2017-08-09 HISTORY — DX: Chronic diastolic (congestive) heart failure: I50.32

## 2017-08-09 HISTORY — PX: LEFT HEART CATH AND CORONARY ANGIOGRAPHY: CATH118249

## 2017-08-09 LAB — COMPREHENSIVE METABOLIC PANEL
ALK PHOS: 47 U/L (ref 38–126)
ALT: 22 U/L (ref 17–63)
AST: 95 U/L — AB (ref 15–41)
Albumin: 3.9 g/dL (ref 3.5–5.0)
Anion gap: 14 (ref 5–15)
BUN: 17 mg/dL (ref 6–20)
CALCIUM: 8.8 mg/dL — AB (ref 8.9–10.3)
CO2: 25 mmol/L (ref 22–32)
Chloride: 101 mmol/L (ref 101–111)
Creatinine, Ser: 1.28 mg/dL — ABNORMAL HIGH (ref 0.61–1.24)
GFR calc Af Amer: >60 mL/min (ref >60)
GFR, EST NON AFRICAN AMERICAN: 52 mL/min — AB (ref >60)
GLUCOSE: 169 mg/dL — AB (ref 65–99)
Potassium: 3.8 mmol/L (ref 3.5–5.1)
Sodium: 140 mmol/L (ref 135–145)
TOTAL PROTEIN: 7.7 g/dL (ref 6.5–8.1)
Total Bilirubin: 0.6 mg/dL (ref 0.3–1.2)

## 2017-08-09 LAB — BLOOD GAS, ARTERIAL
ACID-BASE DEFICIT: 6.9 mmol/L — AB (ref 0.0–2.0)
Acid-base deficit: 3.8 mmol/L — ABNORMAL HIGH (ref 0.0–2.0)
BICARBONATE: 20.4 mmol/L (ref 20.0–28.0)
Bicarbonate: 20.4 mmol/L (ref 20.0–28.0)
DRAWN BY: 232811
Delivery systems: POSITIVE
Delivery systems: POSITIVE
Expiratory PAP: 8
Expiratory PAP: 8
FIO2: 100
FIO2: 50
Inspiratory PAP: 18
Inspiratory PAP: 10
LHR: 8 {breaths}/min
O2 SAT: 99.3 %
O2 SAT: 99.6 %
PATIENT TEMPERATURE: 99.2
PATIENT TEMPERATURE: 37
PCO2 ART: 36.2 mmHg (ref 32.0–48.0)
PH ART: 7.369 (ref 7.350–7.450)
PO2 ART: 402 mmHg — AB (ref 83.0–108.0)
PO2 ART: 232 mmHg — AB (ref 83.0–108.0)
pCO2 arterial: 49.9 mmHg — ABNORMAL HIGH (ref 32.0–48.0)
pH, Arterial: 7.238 — ABNORMAL LOW (ref 7.350–7.450)

## 2017-08-09 LAB — CBC WITH DIFFERENTIAL/PLATELET
BASOS ABS: 0 10*3/uL (ref 0.0–0.1)
BASOS PCT: 0 %
EOS ABS: 0.2 10*3/uL (ref 0.0–0.7)
EOS PCT: 1 %
HCT: 44.5 % (ref 39.0–52.0)
Hemoglobin: 15.1 g/dL (ref 13.0–17.0)
Lymphocytes Relative: 14 %
Lymphs Abs: 2.5 10*3/uL (ref 0.7–4.0)
MCH: 33 pg (ref 26.0–34.0)
MCHC: 33.9 g/dL (ref 30.0–36.0)
MCV: 97.4 fL (ref 78.0–100.0)
MONO ABS: 1.7 10*3/uL — AB (ref 0.1–1.0)
Monocytes Relative: 9 %
Neutro Abs: 13.7 10*3/uL — ABNORMAL HIGH (ref 1.7–7.7)
Neutrophils Relative %: 76 %
PLATELETS: 213 10*3/uL (ref 150–400)
RBC: 4.57 MIL/uL (ref 4.22–5.81)
RDW: 13.8 % (ref 11.5–15.5)
WBC: 18.1 10*3/uL — ABNORMAL HIGH (ref 4.0–10.5)

## 2017-08-09 LAB — HIV ANTIBODY (ROUTINE TESTING W REFLEX): HIV Screen 4th Generation wRfx: NONREACTIVE

## 2017-08-09 LAB — RESPIRATORY PANEL BY PCR
Adenovirus: NOT DETECTED
BORDETELLA PERTUSSIS-RVPCR: NOT DETECTED
CHLAMYDOPHILA PNEUMONIAE-RVPPCR: NOT DETECTED
CORONAVIRUS 229E-RVPPCR: NOT DETECTED
CORONAVIRUS HKU1-RVPPCR: NOT DETECTED
Coronavirus NL63: NOT DETECTED
Coronavirus OC43: NOT DETECTED
Influenza A: NOT DETECTED
Influenza B: NOT DETECTED
MYCOPLASMA PNEUMONIAE-RVPPCR: NOT DETECTED
Metapneumovirus: NOT DETECTED
Parainfluenza Virus 1: NOT DETECTED
Parainfluenza Virus 2: NOT DETECTED
Parainfluenza Virus 3: NOT DETECTED
Parainfluenza Virus 4: NOT DETECTED
Respiratory Syncytial Virus: NOT DETECTED
Rhinovirus / Enterovirus: DETECTED — AB

## 2017-08-09 LAB — URINALYSIS, ROUTINE W REFLEX MICROSCOPIC
Bilirubin Urine: NEGATIVE
GLUCOSE, UA: NEGATIVE mg/dL
Ketones, ur: NEGATIVE mg/dL
LEUKOCYTES UA: NEGATIVE
NITRITE: NEGATIVE
PH: 5 (ref 5.0–8.0)
Protein, ur: NEGATIVE mg/dL
SPECIFIC GRAVITY, URINE: 1.013 (ref 1.005–1.030)

## 2017-08-09 LAB — PROTIME-INR
INR: 1.04
PROTHROMBIN TIME: 13.5 s (ref 11.4–15.2)

## 2017-08-09 LAB — HEMOGLOBIN A1C
Hgb A1c MFr Bld: 5.2 % (ref 4.8–5.6)
Mean Plasma Glucose: 102.54 mg/dL

## 2017-08-09 LAB — I-STAT CG4 LACTIC ACID, ED: Lactic Acid, Venous: 3.63 mmol/L (ref 0.5–1.9)

## 2017-08-09 LAB — LIPID PANEL
Cholesterol: 146 mg/dL (ref 0–200)
HDL: 41 mg/dL (ref >40)
LDL CALC: 80 mg/dL (ref 0–99)
Total CHOL/HDL Ratio: 3.6 RATIO
Triglycerides: 125 mg/dL (ref <150)
VLDL: 25 mg/dL (ref 0–40)

## 2017-08-09 LAB — INFLUENZA PANEL BY PCR (TYPE A & B)
INFLAPCR: NEGATIVE
INFLBPCR: NEGATIVE

## 2017-08-09 LAB — ECHOCARDIOGRAM COMPLETE
Height: 69 in
WEIGHTICAEL: 2560 [oz_av]

## 2017-08-09 LAB — STREP PNEUMONIAE URINARY ANTIGEN: Strep Pneumo Urinary Antigen: NEGATIVE

## 2017-08-09 LAB — BRAIN NATRIURETIC PEPTIDE: B NATRIURETIC PEPTIDE 5: 345.2 pg/mL — AB (ref 0.0–100.0)

## 2017-08-09 LAB — LACTIC ACID, PLASMA
Lactic Acid, Venous: 4.9 mmol/L (ref 0.5–1.9)
Lactic Acid, Venous: 4.7 mmol/L (ref 0.5–1.9)

## 2017-08-09 LAB — APTT: APTT: 28 s (ref 24–36)

## 2017-08-09 LAB — I-STAT TROPONIN, ED: TROPONIN I, POC: 6.8 ng/mL — AB (ref 0.00–0.08)

## 2017-08-09 LAB — MRSA PCR SCREENING: MRSA BY PCR: NEGATIVE

## 2017-08-09 LAB — PROCALCITONIN: Procalcitonin: <0.1 ng/mL

## 2017-08-09 LAB — TROPONIN I
TROPONIN I: 6.18 ng/mL — AB (ref <0.03)
TROPONIN I: 27.86 ng/mL — AB (ref <0.03)
Troponin I: 15.25 ng/mL (ref <0.03)

## 2017-08-09 LAB — LIPASE, BLOOD: LIPASE: 35 U/L (ref 11–51)

## 2017-08-09 SURGERY — LEFT HEART CATH AND CORONARY ANGIOGRAPHY
Anesthesia: LOCAL

## 2017-08-09 MED ORDER — ASPIRIN 81 MG PO CHEW
324.0000 mg | CHEWABLE_TABLET | Freq: Once | ORAL | Status: DC
Start: 2017-08-09 — End: 2017-08-09

## 2017-08-09 MED ORDER — PIPERACILLIN-TAZOBACTAM 3.375 G IVPB 30 MIN
3.3750 g | Freq: Once | INTRAVENOUS | Status: AC
  Administered 2017-08-09: 3.375 g via INTRAVENOUS
  Filled 2017-08-09: qty 50

## 2017-08-09 MED ORDER — CARVEDILOL 3.125 MG PO TABS
3.1250 mg | ORAL_TABLET | Freq: Two times a day (BID) | ORAL | Status: DC
  Administered 2017-08-09: 3.125 mg via ORAL

## 2017-08-09 MED ORDER — ACETAMINOPHEN 325 MG PO TABS: 650.0000 mg | ORAL_TABLET | ORAL | Status: DC | PRN

## 2017-08-09 MED ORDER — ASPIRIN 81 MG PO CHEW
81.0000 mg | CHEWABLE_TABLET | Freq: Every day | ORAL | Status: DC
  Administered 2017-08-10 – 2017-08-15 (×6): 81 mg via ORAL
  Filled 2017-08-09 (×6): qty 1

## 2017-08-09 MED ORDER — NITROGLYCERIN 0.4 MG SL SUBL: 0.4000 mg | SUBLINGUAL_TABLET | SUBLINGUAL | Status: DC | PRN

## 2017-08-09 MED ORDER — MORPHINE SULFATE (PF) 4 MG/ML IV SOLN: 2.0000 mg | INTRAVENOUS | Status: DC | PRN

## 2017-08-09 MED ORDER — HEPARIN SODIUM (PORCINE) 5000 UNIT/ML IJ SOLN: 5000.0000 [IU] | Freq: Three times a day (TID) | INTRAMUSCULAR | Status: DC

## 2017-08-09 MED ORDER — FUROSEMIDE 10 MG/ML IJ SOLN
80.0000 mg | Freq: Once | INTRAMUSCULAR | Status: AC
  Administered 2017-08-09: 80 mg via INTRAVENOUS
  Filled 2017-08-09: qty 8

## 2017-08-09 MED ORDER — SODIUM CHLORIDE 0.9% FLUSH: 3.0000 mL | INTRAVENOUS | Status: DC | PRN

## 2017-08-09 MED ORDER — HEPARIN SODIUM (PORCINE) 1000 UNIT/ML IJ SOLN
INTRAMUSCULAR | Status: AC
  Filled 2017-08-09: qty 1

## 2017-08-09 MED ORDER — VANCOMYCIN HCL IN DEXTROSE 1-5 GM/200ML-% IV SOLN
1000.0000 mg | Freq: Once | INTRAVENOUS | Status: AC
  Administered 2017-08-09: 1000 mg via INTRAVENOUS
  Filled 2017-08-09: qty 200

## 2017-08-09 MED ORDER — PIPERACILLIN-TAZOBACTAM 3.375 G IVPB
3.3750 g | Freq: Three times a day (TID) | INTRAVENOUS | Status: DC
  Administered 2017-08-09 – 2017-08-10 (×2): 3.375 g via INTRAVENOUS
  Filled 2017-08-09 (×4): qty 50

## 2017-08-09 MED ORDER — CARVEDILOL 3.125 MG PO TABS
3.1250 mg | ORAL_TABLET | Freq: Two times a day (BID) | ORAL | Status: DC
  Administered 2017-08-09: 3.125 mg via ORAL
  Filled 2017-08-09 (×3): qty 1

## 2017-08-09 MED ORDER — SODIUM CHLORIDE 0.9 % IV SOLN: INTRAVENOUS | Status: DC

## 2017-08-09 MED ORDER — IOPAMIDOL (ISOVUE-300) INJECTION 61%
INTRAVENOUS | Status: AC
  Filled 2017-08-09: qty 30

## 2017-08-09 MED ORDER — PNEUMOCOCCAL VAC POLYVALENT 25 MCG/0.5ML IJ INJ
0.5000 mL | INJECTION | INTRAMUSCULAR | Status: AC
Start: 2017-08-10 — End: 2017-08-12
  Administered 2017-08-12: 0.5 mL via INTRAMUSCULAR
  Filled 2017-08-09 (×2): qty 0.5

## 2017-08-09 MED ORDER — ASPIRIN 81 MG PO CHEW
324.0000 mg | CHEWABLE_TABLET | Freq: Every day | ORAL | Status: DC
  Administered 2017-08-09: 324 mg via ORAL
  Filled 2017-08-09: qty 4

## 2017-08-09 MED ORDER — ONDANSETRON HCL 4 MG/2ML IJ SOLN: 4.0000 mg | Freq: Three times a day (TID) | INTRAMUSCULAR | Status: DC | PRN

## 2017-08-09 MED ORDER — SODIUM CHLORIDE 0.9 % IV SOLN
Freq: Once | INTRAVENOUS | Status: DC
Start: 2017-08-09 — End: 2017-08-09

## 2017-08-09 MED ORDER — HYDRALAZINE HCL 20 MG/ML IJ SOLN: 5.0000 mg | INTRAMUSCULAR | Status: DC | PRN

## 2017-08-09 MED ORDER — FUROSEMIDE 10 MG/ML IJ SOLN
40.0000 mg | Freq: Once | INTRAMUSCULAR | Status: AC
  Administered 2017-08-09: 40 mg via INTRAVENOUS

## 2017-08-09 MED ORDER — VERAPAMIL HCL 2.5 MG/ML IV SOLN
INTRAVENOUS | Status: DC | PRN
  Administered 2017-08-09: 4 mL via INTRA_ARTERIAL

## 2017-08-09 MED ORDER — ATORVASTATIN CALCIUM 40 MG PO TABS
40.0000 mg | ORAL_TABLET | Freq: Every day | ORAL | Status: DC
  Administered 2017-08-09 – 2017-08-14 (×6): 40 mg via ORAL
  Filled 2017-08-09 (×5): qty 1

## 2017-08-09 MED ORDER — DM-GUAIFENESIN ER 30-600 MG PO TB12
1.0000 | ORAL_TABLET | Freq: Two times a day (BID) | ORAL | Status: DC | PRN
  Administered 2017-08-10: 1 via ORAL
  Filled 2017-08-09 (×2): qty 1

## 2017-08-09 MED ORDER — LEVALBUTEROL HCL 1.25 MG/0.5ML IN NEBU
1.2500 mg | INHALATION_SOLUTION | Freq: Four times a day (QID) | RESPIRATORY_TRACT | Status: DC
  Administered 2017-08-09 (×2): 1.25 mg via RESPIRATORY_TRACT
  Filled 2017-08-09 (×3): qty 0.5

## 2017-08-09 MED ORDER — SODIUM CHLORIDE 0.9 % IV SOLN: INTRAVENOUS | Status: AC

## 2017-08-09 MED ORDER — ONDANSETRON HCL 4 MG/2ML IJ SOLN: 4.0000 mg | Freq: Four times a day (QID) | INTRAMUSCULAR | Status: DC | PRN

## 2017-08-09 MED ORDER — ACETAMINOPHEN 500 MG PO TABS: 500.0000 mg | ORAL_TABLET | Freq: Four times a day (QID) | ORAL | Status: DC | PRN

## 2017-08-09 MED ORDER — LIDOCAINE HCL 2 % IJ SOLN
INTRAMUSCULAR | Status: DC | PRN
  Administered 2017-08-09: 2 mL

## 2017-08-09 MED ORDER — INFLUENZA VAC SPLIT HIGH-DOSE 0.5 ML IM SUSY
0.5000 mL | PREFILLED_SYRINGE | INTRAMUSCULAR | Status: AC
  Administered 2017-08-12: 0.5 mL via INTRAMUSCULAR
  Filled 2017-08-09 (×2): qty 0.5

## 2017-08-09 MED ORDER — HEPARIN (PORCINE) IN NACL 100-0.45 UNIT/ML-% IJ SOLN
900.0000 [IU]/h | INTRAMUSCULAR | Status: DC
  Filled 2017-08-09: qty 250

## 2017-08-09 MED ORDER — IOPAMIDOL (ISOVUE-370) INJECTION 76%
INTRAVENOUS | Status: AC
  Filled 2017-08-09: qty 100

## 2017-08-09 MED ORDER — IPRATROPIUM BROMIDE 0.02 % IN SOLN
0.5000 mg | Freq: Four times a day (QID) | RESPIRATORY_TRACT | Status: DC
  Administered 2017-08-09: 0.5 mg via RESPIRATORY_TRACT
  Filled 2017-08-09 (×3): qty 2.5

## 2017-08-09 MED ORDER — SODIUM CHLORIDE 0.9% FLUSH
3.0000 mL | Freq: Two times a day (BID) | INTRAVENOUS | Status: DC
  Administered 2017-08-10 – 2017-08-15 (×9): 3 mL via INTRAVENOUS

## 2017-08-09 MED ORDER — ZOLPIDEM TARTRATE 5 MG PO TABS: 5.0000 mg | ORAL_TABLET | Freq: Every evening | ORAL | Status: DC | PRN

## 2017-08-09 MED ORDER — HEPARIN (PORCINE) IN NACL 2-0.9 UNIT/ML-% IJ SOLN
INTRAMUSCULAR | Status: AC
  Filled 2017-08-09: qty 1000

## 2017-08-09 MED ORDER — POTASSIUM CHLORIDE CRYS ER 20 MEQ PO TBCR
40.0000 meq | EXTENDED_RELEASE_TABLET | Freq: Once | ORAL | Status: AC
  Administered 2017-08-09: 40 meq via ORAL
  Filled 2017-08-09: qty 2

## 2017-08-09 MED ORDER — HEPARIN (PORCINE) IN NACL 100-0.45 UNIT/ML-% IJ SOLN
900.0000 [IU]/h | INTRAMUSCULAR | Status: DC
  Administered 2017-08-09: 900 [IU]/h via INTRAVENOUS
  Filled 2017-08-09: qty 250

## 2017-08-09 MED ORDER — SODIUM CHLORIDE 0.9 % IV SOLN
250.0000 mL | INTRAVENOUS | Status: DC | PRN
Start: 2017-08-09 — End: 2017-08-10

## 2017-08-09 MED ORDER — VANCOMYCIN HCL IN DEXTROSE 750-5 MG/150ML-% IV SOLN
750.0000 mg | Freq: Two times a day (BID) | INTRAVENOUS | Status: DC
  Administered 2017-08-10: 750 mg via INTRAVENOUS
  Filled 2017-08-09 (×3): qty 150

## 2017-08-09 MED ORDER — LIDOCAINE HCL 2 % IJ SOLN
INTRAMUSCULAR | Status: AC
Start: 2017-08-09 — End: ?
  Filled 2017-08-09: qty 10

## 2017-08-09 MED ORDER — ATORVASTATIN CALCIUM 40 MG PO TABS
40.0000 mg | ORAL_TABLET | Freq: Every day | ORAL | Status: DC
  Filled 2017-08-09 (×2): qty 1

## 2017-08-09 MED ORDER — SODIUM CHLORIDE 0.9 % IV BOLUS (SEPSIS)
1000.0000 mL | Freq: Once | INTRAVENOUS | Status: AC
  Administered 2017-08-09: 1000 mL via INTRAVENOUS

## 2017-08-09 MED ORDER — FUROSEMIDE 10 MG/ML IJ SOLN
INTRAMUSCULAR | Status: AC
  Filled 2017-08-09: qty 4

## 2017-08-09 MED ORDER — FUROSEMIDE 10 MG/ML IJ SOLN: 40.0000 mg | Freq: Once | INTRAMUSCULAR | Status: DC

## 2017-08-09 MED ORDER — HEPARIN (PORCINE) IN NACL 100-0.45 UNIT/ML-% IJ SOLN
1200.0000 [IU]/h | INTRAMUSCULAR | Status: DC
  Administered 2017-08-09: 900 [IU]/h via INTRAVENOUS
  Administered 2017-08-10: 1200 [IU]/h via INTRAVENOUS
  Filled 2017-08-09 (×2): qty 250

## 2017-08-09 MED ORDER — DEXTROSE 5 % IV SOLN
500.0000 mg | INTRAVENOUS | Status: DC
  Administered 2017-08-09 – 2017-08-10 (×2): 500 mg via INTRAVENOUS
  Filled 2017-08-09 (×2): qty 500

## 2017-08-09 MED ORDER — HEPARIN (PORCINE) IN NACL 2-0.9 UNIT/ML-% IJ SOLN
INTRAMUSCULAR | Status: AC | PRN
  Administered 2017-08-09: 1000 mL

## 2017-08-09 MED ORDER — LEVALBUTEROL HCL 1.25 MG/0.5ML IN NEBU
1.2500 mg | INHALATION_SOLUTION | Freq: Four times a day (QID) | RESPIRATORY_TRACT | Status: DC
  Administered 2017-08-10: 1.25 mg via RESPIRATORY_TRACT
  Filled 2017-08-09: qty 0.5

## 2017-08-09 MED ORDER — MORPHINE SULFATE (PF) 2 MG/ML IV SOLN: 2.0000 mg | INTRAVENOUS | Status: DC | PRN

## 2017-08-09 MED ORDER — HEPARIN BOLUS VIA INFUSION
4000.0000 [IU] | Freq: Once | INTRAVENOUS | Status: AC
  Administered 2017-08-09: 4000 [IU] via INTRAVENOUS
  Filled 2017-08-09: qty 4000

## 2017-08-09 MED ORDER — HEPARIN BOLUS VIA INFUSION
4000.0000 [IU] | Freq: Once | INTRAVENOUS | Status: DC
  Filled 2017-08-09: qty 4000

## 2017-08-09 MED ORDER — HEPARIN SODIUM (PORCINE) 1000 UNIT/ML IJ SOLN
INTRAMUSCULAR | Status: DC | PRN
  Administered 2017-08-09: 4000 [IU] via INTRAVENOUS

## 2017-08-09 MED ORDER — SODIUM CHLORIDE 0.9 % IV BOLUS (SEPSIS): 250.0000 mL | Freq: Once | INTRAVENOUS | Status: DC

## 2017-08-09 MED ORDER — VERAPAMIL HCL 2.5 MG/ML IV SOLN
INTRAVENOUS | Status: AC
  Filled 2017-08-09: qty 2

## 2017-08-09 MED ORDER — IOPAMIDOL (ISOVUE-370) INJECTION 76%
INTRAVENOUS | Status: DC | PRN
  Administered 2017-08-09: 50 mL via INTRA_ARTERIAL

## 2017-08-09 SURGICAL SUPPLY — 12 items
CATH INFINITI 5FR ANG PIGTAIL (CATHETERS) ×2 IMPLANT
CATH OPTITORQUE TIG 4.0 5F (CATHETERS) ×2 IMPLANT
DEVICE RAD COMP TR BAND LRG (VASCULAR PRODUCTS) ×2 IMPLANT
ELECT DEFIB PAD ADLT CADENCE (PAD) ×2 IMPLANT
GLIDESHEATH SLEND A-KIT 6F 22G (SHEATH) ×2 IMPLANT
GUIDEWIRE INQWIRE 1.5J.035X260 (WIRE) ×1 IMPLANT
INQWIRE 1.5J .035X260CM (WIRE) ×2
KIT HEART LEFT (KITS) ×2 IMPLANT
PACK CARDIAC CATHETERIZATION (CUSTOM PROCEDURE TRAY) ×2 IMPLANT
TRANSDUCER W/STOPCOCK (MISCELLANEOUS) ×2 IMPLANT
TUBING CIL FLEX 10 FLL-RA (TUBING) ×2 IMPLANT
WIRE HI TORQ VERSACORE-J 145CM (WIRE) ×2 IMPLANT

## 2017-08-09 NOTE — ED Notes (Signed)
Patient O2 saturation dropped.  Patient becoming symptomatic.  Dr. Clyde LundborgNiu notified.  Patient placed on Bi Pap and 40mg  ordered by Dr. Clyde LundborgNiu.

## 2017-08-09 NOTE — Progress Notes (Signed)
  Echocardiogram 2D Echocardiogram has been performed.  Jacob Barajas T Jacob Barajas 08/09/2017, 12:31 PM

## 2017-08-09 NOTE — ED Provider Notes (Addendum)
TIME SEEN: 2:28 AM  CHIEF COMPLAINT: Respiratory distress, fever  HPI: Patient is a 76 year old male with history of dementia coming from home with vomiting, fever. EMS reports that they were called out secondary to patient vomiting. States that he was found in poor living conditions with his son. They report that his temperature was 101.8 temporally, blood pressure 80/40, heart rate 130, oxygen saturation 80% on room air. Patient denies wearing oxygen at home. He denies any current pain including headache, neck pain, chest pain, abdominal pain. Denies nausea currently. Admits to vomiting but denies diarrhea. States he has been short of breath. Given 5 mg of albuterol with EMS, 1000 mg of Tylenol, 4 mg of Zofran and 125 mg of Solu-Medrol.  ROS: Level V cavity out secondary to dementia  PAST MEDICAL HISTORY/PAST SURGICAL HISTORY:  Past Medical History:  Diagnosis Date  . Dementia   . Gout     MEDICATIONS:  Prior to Admission medications   Medication Sig Start Date End Date Taking? Authorizing Provider  acetaminophen (TYLENOL) 500 MG tablet Take 500 mg by mouth every 6 (six) hours as needed for mild pain or moderate pain.     [provider]  allopurinol (ZYLOPRIM) 100 MG tablet Take 100 mg by mouth daily.    [provider]  Misc Natural Products (BLACK CHERRY CONCENTRATE PO) Take 1,000 mg by mouth daily. For Gout    [provider]  tamsulosin (FLOMAX) 0.4 MG CAPS capsule Take 1 capsule (0.4 mg total) by mouth daily after supper. 05/07/16   Johnson, Clanford L, MD  UNABLE TO FIND Take 1 Dose by mouth 2 (two) times daily. Med Pass 120 ml by mouth two times daily     [provider]    ALLERGIES:  No Known Allergies  SOCIAL HISTORY:  Social History  Substance Use Topics  . Smoking status: Former Games developer  . Smokeless tobacco: Never Used  . Alcohol use No    FAMILY HISTORY: Family History  Problem Relation Age of Onset  . Heart disease Brother   .  Cancer Sister        "throat" cancer    EXAM: BP (!) 134/113 (BP Location: Left Arm)   Pulse (!) 131   Temp 99.2 F (37.3 C) (Rectal)   Resp (!) 21   Ht 5\' 9"  (1.753 m)   Wt 72.6 kg (160 lb)   SpO2 93%   BMI 23.63 kg/m  CONSTITUTIONAL: Alert and oriented to person and responds appropriately to questions. Elderly, smiling, in no significant distress HEAD: Normocephalic EYES: Conjunctivae clear, pupils appear equal, EOMI ENT: normal nose; moist mucous membranes NECK: Supple, no meningismus, no nuchal rigidity, no LAD  CARD: Regular and tachycardic; S1 and S2 appreciated; no murmurs, no clicks, no rubs, no gallops RESP: Normal chest excursion without splinting, tachypneic, diffuse rhonchorous breath sounds on exam, no wheezing or rales, no hypoxia, speaking full sentences ABD/GI: Normal bowel sounds; non-distended; soft, non-tender, no rebound, no guarding, no peritoneal signs, no hepatosplenomegaly BACK:  The back appears normal and is non-tender to palpation, there is no CVA tenderness EXT: Normal ROM in all joints; non-tender to palpation; no edema; normal capillary refill; no cyanosis, no calf tenderness or swelling    SKIN: Normal color for age and race; warm; no rash NEURO: Moves all extremities equally PSYCH: The patient's mood and manner are appropriate. Grooming and personal hygiene are appropriate.  MEDICAL DECISION MAKING: Patient here with sepsis. Temperature was 101.8 with EMS. Given Tylenol  and round. Tachycardic, tachypneic. No hypotension. Will obtain labs, cultures, urine, chest x-ray. No signs or symptoms of meningitis. Anticipate admission. We'll give IV fluids and broad-spectrum antibiotics.  ED PROGRESS: Patient's EKG shows left bundle branch block which is old. Troponin is elevated but he denies chest pain. Suspect this is demand ischemia in the setting of sepsis. He has a leukocytosis with left shift. Mild acute kidney injury. Elevated lactate. Chest x-ray shows  mild diffuse nonspecific interstitial changes that could be edema versus bronchitic changes. I favor bronchitic changes especially given fever, leukocytosis and rhonchorous breath sounds. He does not appear volume overloaded. We'll discuss with hospitalist for admission.  4:25 AM Discussed patient's case with hospitalist, Dr. Clyde LundborgNiu.  I have recommended admission and patient (and family if present) agree with this plan. Admitting physician will place admission orders.   We'll also obtain cardiology consult. We'll give aspirin.  I reviewed all nursing notes, vitals, pertinent previous records, EKGs, lab and urine results, imaging (as available).   4:50 AM  D/w Dr. Cristal Deerhristopher with cardiology.  She states that at this time she does not feel the patient needs to be [facility]. Recommend serial cardiac enzymes and an echocardiogram. She states at this time she would hold heparin. They will formally consult on the patient in [facility]. I have updated the hospitalist.    5:05 AM  Pt continue to have diffuse rhonchorous breath sounds. Hospitalist has seen patient and feels that he may be volume overloaded now. Discussed with hospitalist the patient had rhonchorous breath sounds previously and was hypoxic, tachypneic. I'm concerned more for sepsis. Hospitalist has ordered IV Lasix which was accidentally placed under my name by the nurse. Hospitalist has also ordered BiPAP.  Patient received 2250 mL of IV fluids per septic protocol for IV fluid bolus of 30 mL/kg.    EKG Interpretation  Date/Time:  Monday August 09 2017 02:25:48 EDT Ventricular Rate:  132 PR Interval:    QRS Duration: 161 QT Interval:  386 QTC Calculation: 573 R Axis:   1 Text Interpretation:  Sinus tachycardia Atrial premature complexes in couplets Left bundle branch block No significant change since last tracing Confirmed by Ward, Baxter HireKristen [phone]) on 08/09/2017 3:13:41 AM          CRITICAL CARE Performed by: Raelyn NumberWARD, KRISTEN N   Total critical care time: 55 minutes  Critical care time was exclusive of separately billable procedures and treating other patients.  Critical care was necessary to treat or prevent imminent or life-threatening deterioration.  Critical care was time spent personally by me on the following activities: development of treatment plan with patient and/or surrogate as well as nursing, discussions with consultants, evaluation of patient's response to treatment, examination of patient, obtaining history from patient or surrogate, ordering and performing treatments and interventions, ordering and review of laboratory studies, ordering and review of radiographic studies, pulse oximetry and re-evaluation of patient's condition.    Ward, Layla MawKristen N, DO 08/09/17 0456    Ward, Layla MawKristen N, DO 08/09/17 [phone]

## 2017-08-09 NOTE — Progress Notes (Signed)
CSW received call from [facility]. The patient is being followed in the community for previous report about living situation and care.She faxed over official request form for medical information. CSW provided H&P and Current Progress Note.  She reports the patient family is aware of APS involvement. Patient lives with his son: Adalberto Illllen Haggard. Listed on Emergency Contact list.   Vivi BarrackNicole Bentlie Withem, Theresia MajorsLCSWA, MSW Clinical Social Worker  [phone] 08/09/2017  3:01 PM

## 2017-08-09 NOTE — Progress Notes (Addendum)
[facility] PROGRESS NOTE  Jacob Barajas NWG:[id number] DOB: Jun 22, 1941 DOA: 08/09/2017  PCP: Patient, No Pcp Per  Brief History/Interval Summary: 76 year old Caucasian male who apparently lives with his son with a past medical history of gout, dementia, diastolic CHF was brought in by EMS.  They were called as patient was experiencing nausea and vomiting.  They found him to be hypotensive with a blood pressure of 80/40 and increased heart rate up to 130.  Saturations were 80% on room air.  He was also found to have a fever.  Patient was brought in for further evaluation.  He was placed on BiPAP and was admitted to the intensive care unit.  He was noted to have elevated troponin.  Cardiology was consulted.  Reason for Visit: Acute respiratory failure with hypoxia  Consultants: Cardiology  Procedures: Transthoracic echocardiogram is pending  Antibiotics: Vancomycin and Zosyn  Subjective/Interval History: Patient is on BiPAP.  He states that he is feeling well.  Denies any chest pain or any other kind of pain.  Denies any difficulty breathing.  ROS: Difficult to do due to BiPAP  Objective:  Vital Signs  Vitals:   08/09/17 0545 08/09/17 0600 08/09/17 0700 08/09/17 0800  BP: 120/88 (!) 129/98 118/77   Pulse: (!) 116 (!) 115 (!) 107   Resp: (!) 21 20 20    Temp:   98.6 F (37 C) (!) 97.4 F (36.3 C)  TempSrc:   Axillary Axillary  SpO2: 97% 98% 100%   Weight:      Height:        Intake/Output Summary (Last 24 hours) at 08/09/17 0932 Last data filed at 08/09/17 5784  Gross per 24 hour  Intake             1675 ml  Output                0 ml  Net             1675 ml   Filed Weights   08/09/17 0234  Weight: 72.6 kg (160 lb)    General appearance: alert, cooperative, distracted and no distress Head: Normocephalic, without obvious abnormality, atraumatic Resp: Coarse breath sounds bilaterally.  No definite wheezing crackles or rhonchi is appreciated. Cardio: regular  rate and rhythm, S1, S2 normal, no murmur, click, rub or gallop GI: soft, non-tender; bowel sounds normal; no masses,  no organomegaly Extremities: extremities normal, atraumatic, no cyanosis or edema Neurologic: Awake alert.  On BiPAP.  Moving all his extremities.  Lab Results:  Data Reviewed: I have personally reviewed following labs and imaging studies  CBC:  Recent Labs Lab 08/09/17 0246  WBC 18.1*  NEUTROABS 13.7*  HGB 15.1  HCT 44.5  MCV 97.4  PLT 213    Basic Metabolic Panel:  Recent Labs Lab 08/09/17 0246  NA 140  K 3.8  CL 101  CO2 25  GLUCOSE 169*  BUN 17  CREATININE 1.28*  CALCIUM 8.8*    GFR: Estimated Creatinine Clearance: 48.3 mL/min (A) (by C-G formula based on SCr of 1.28 mg/dL (H)).  Liver Function Tests:  Recent Labs Lab 08/09/17 0246  AST 95*  ALT 22  ALKPHOS 47  BILITOT 0.6  PROT 7.7  ALBUMIN 3.9     Recent Labs Lab 08/09/17 0720  LIPASE 35   Coagulation Profile:  Recent Labs Lab 08/09/17 0720  INR 1.04    Cardiac Enzymes:  Recent Labs Lab 08/09/17 0426 08/09/17 0720  TROPONINI 6.18* 15.25*  HbA1C:  Recent Labs  08/09/17 0720  HGBA1C 5.2    Lipid Profile:  Recent Labs  08/09/17 0720  CHOL 146  HDL 41  LDLCALC 80  TRIG 125  CHOLHDL 3.6     Recent Results (from the past 240 hour(s))  MRSA PCR Screening     Status: None   Collection Time: 08/09/17  6:39 AM  Result Value Ref Range Status   MRSA by PCR NEGATIVE NEGATIVE Final    Comment:        The GeneXpert MRSA Assay (FDA approved for NASAL specimens only), is one component of a comprehensive MRSA colonization surveillance program. It is not intended to diagnose MRSA infection nor to guide or monitor treatment for MRSA infections.       Radiology Studies: Dg Chest Port 1 View  Result Date: 08/09/2017 CLINICAL DATA:  Respiratory failure, sepsis, CHF and myocardial infarction. EXAM: PORTABLE CHEST 1 VIEW COMPARISON:  Prior study  at 0230 hours FINDINGS: The heart size and mediastinal contours are within normal limits. Lungs show persistent mild bronchial and interstitial prominence bilaterally. There may be a component of mild interstitial edema as well as acute bronchitis. No significant focal airspace consolidation or pleural effusions are identified. No evidence of pneumothorax. The visualized skeletal structures are unremarkable. IMPRESSION: Stable mild bronchial and interstitial prominence with potential component of mild interstitial edema as well as acute bronchitis. Electronically Signed   By: Irish Lack M.D.   On: 08/09/2017 08:25   Dg Chest Port 1 View  Result Date: 08/09/2017 CLINICAL DATA:  Fever and cough, ex-smoker. EXAM: PORTABLE CHEST 1 VIEW COMPARISON:  05/04/2016 FINDINGS: The heart size and mediastinal contours are within normal limits. Nonspecific mild diffuse interstitial prominence is noted which may represent mild interstitial edema or bronchitic change some considerations. No significant effusion or pneumothorax. No confluent airspace disease. No acute nor suspicious osseous abnormality. IMPRESSION: Nonspecific mild diffuse interstitial prominence with differential possibilities that may include bronchitic change or mild interstitial edema. Given history of fever and cough, favor bronchitic change. Electronically Signed   By: Tollie Eth M.D.   On: 08/09/2017 03:14     Medications:  Scheduled: . aspirin  324 mg Oral Daily  . atorvastatin  40 mg Oral q1800  . carvedilol  3.125 mg Oral BID WC  . heparin subcutaneous  5,000 Units Subcutaneous Q8H  . ipratropium  0.5 mg Nebulization Q6H  . levalbuterol  1.25 mg Nebulization Q6H   Continuous: . azithromycin    . piperacillin-tazobactam (ZOSYN)  IV    . vancomycin     ZOX:WRUEAVWUJWJXB, dextromethorphan-guaiFENesin, hydrALAZINE, morphine injection, nitroGLYCERIN, ondansetron, zolpidem  Assessment/Plan:  Principal Problem:   Acute respiratory  failure with hypoxia and hypercapnia (HCC) Active Problems:   Gout   Dementia   NSTEMI (non-ST elevated myocardial infarction) (HCC)   Sepsis (HCC)   Bronchitis   AKI (acute kidney injury) (HCC)   Nausea & vomiting   Abdominal distension   Chronic diastolic CHF (congestive heart failure) (HCC)    Acute respiratory failure with hypoxia and hypercapnia Initially thought to be infectious as patient did have fever and has elevated WBC.  However patient's respiratory status got worse after he was given fluid boluses.  He had to be given Lasix.  Respiratory status is appears to be stable.  Chest x-ray was repeated this morning and continues to raise concern for bronchitis and mild interstitial edema.  ABG is pending.  He appears to be tolerating BiPAP quite well.  We  may be may be able to get him off of it sometime this morning.  Sepsis with Lactic Acidosis Lactic acid noted to be significantly elevated.  Apparently patient was febrile at home.  His blood pressure was low.  Blood pressure is stable currently.  He is currently afebrile.  No obvious source of infection has been found yet.  He is being treated with broad-spectrum antibiotics, including vancomycin and Zosyn and azithromycin.  Follow-up on culture data.  Since patient went into pulmonary edema with fluid boluses we will hold off on more boluses for now and monitor his lactic acid level.  If his respiratory status remains stable then fluid boluses can be attempted later today if lactic acid levels remain elevated.  We will also wait on echocardiogram.  Pro-calcitonin level was less than 0.1 which is reassuring.  Elevated troponin possible non-ST elevation MI Cardiology consulted.  Troponin significantly elevated.  Patient is on aspirin, beta blocker.  Not on anticoagulation currently.  Echocardiogram is pending.  EKG appears similar to previous EKGs.  Patient denies any chest pain currently.  LDL is 80.  Chronic diastolic CHF Diagnosis  is unclear but was based on an echocardiogram from 2017 which showed grade 1 diastolic dysfunction.  Chest x-ray does suggest interstitial edema but could also be infectious.  Respiratory status did get worse with the fluid boluses and he had to be given Lasix with which he improved.  Hold off on further doses of Lasix for now.  Continue to monitor closely.  Mild acute kidney injury Creatinine slightly higher than his baseline.  Continue to monitor urine output.  Repeat labs tomorrow.  Nausea and vomiting and abdominal distention This was his original symptom.  Abdomen does not appear to be very distended currently.  CT has been ordered and is pending.  Lipase level is normal.  Symptomatic treatment.  History of gout Stable.  Continue to monitor. Unclear if he takes any medications for the same.  None listed on his home medication list.  ADDENDUM More data became available.  Echocardiogram shows an ejection fraction of 25% with WMA.  Patient's troponin continues to rise.  Patient however stable from a respiratory standpoint.  Cardiology plans to proceed with cardiac catheterization.  [facility].  In view of all of these findings it appears that his initial presentation could have been all due to pulmonary edema.  Leave him on antibiotics for now until all culture data is available.  And then these could be stopped as appropriate.  DVT Prophylaxis: Subcutaneous heparin    Code Status: Full Code Family Communication: No family at bedside Disposition Plan: Management as outlined above.  ICU setting for now.    LOS: 0 days   [facility] Hospital Of NewnanKRISHNAN,Kemari Mares  [facility] Pager [phone] 08/09/2017, 9:32 AM  If 7PM-7AM, please contact night-coverage at [email or web address], password [email or web address]

## 2017-08-09 NOTE — Progress Notes (Signed)
A consult was received from an ED physician for zosyn and vancomycin per pharmacy dosing.  The patient's profile has been reviewed for ht/wt/allergies/indication/available labs.   A one time order has been placed for zosyn 3.375 Gm and Vancomycin 1 Gm.  Further antibiotics/pharmacy consults should be ordered by admitting physician if indicated.                       Thank you, Lorenza EvangelistGreen, Deja Kaigler R 08/09/2017  2:41 AM

## 2017-08-09 NOTE — Progress Notes (Addendum)
ANTICOAGULATION CONSULT NOTE - Initial Consult  Pharmacy Consult for heparin Indication: chest pain/ACS  No Known Allergies  Patient Measurements: Height: 5\' 9"  (175.3 cm) Weight: 160 lb (72.6 kg) IBW/kg (Calculated) : 70.7 Heparin Dosing Weight: 72.6kg  Vital Signs: Temp: 97.4 F (36.3 C) (10/22 0800) Temp Source: Axillary (10/22 0800) BP: 121/72 (10/22 1000) Pulse Rate: 107 (10/22 0700)  Labs:  Recent Labs  08/09/17 0246 08/09/17 0426 08/09/17 0720 08/09/17 1025  HGB 15.1  --   --   --   HCT 44.5  --   --   --   PLT 213  --   --   --   APTT  --   --  28  --   LABPROT  --   --  13.5  --   INR  --   --  1.04  --   CREATININE 1.28*  --   --   --   TROPONINI  --  6.18* 15.25* 27.86*    Estimated Creatinine Clearance: 48.3 mL/min (A) (by C-G formula based on SCr of 1.28 mg/dL (H)).   Medical History: Past Medical History:  Diagnosis Date  . Chronic diastolic CHF (congestive heart failure) (HCC) 08/09/2017  . Dementia   . Gout      Assessment: [age] YOM presenting with N/V. Troponin are trending up, pharmacy asked to dose heparin fro NSTEMI.  Patient was ordered heparin gtt at admission 10/22a but was cancelled prior to being administered. No anticoagulants prior to admission  -  Baseline Hgb and pltc WNL  Goal of Therapy:  Heparin level 0.3-0.7 units/ml Monitor platelets by anticoagulation protocol: Yes   Plan:   Heparin 4000 units x 1 (SQ Heparin and heparin bolus not given [facility])  Heparin infusion at 900 units/hr  Heparin level in 8h  Daily heparin level and CBC  Juliette Alcideustin Zeigler, PharmD, BCPS.   Pager: [phone] 08/09/2017 12:32 PM

## 2017-08-09 NOTE — H&P (View-Only) (Signed)
Cardiology Consultation:   Patient ID: Jacob Barajas; [id number]; 19-Jul-1941   Admit date: 08/09/2017 Date of Consult: 08/09/2017  Primary Care Provider: Patient, No Pcp Per Primary Cardiologist: New- Dr Duke Salvia   Patient Profile:   Jacob Barajas is a 76 y.o. male with a hx of gout, dementia, diastolic CHF who is being seen today for the evaluation of NSTEMI at the request of Dr. Clyde Lundborg.  History of Present Illness:   Jacob Barajas presented to [facility] ED on 08/09/17 for shortness of breath, hypotension, and fever. He was found to be in acute respiratory failure and sepsis with fever, leukocytosis, elevated lactic acid and hypercapnia. After being given 2L IV fluids in the ED his respiratory status worsened with rhonchi and crackles on auscultation. He was placed on BiPap and IV antibiotics. BNP was 345.2. CXR showed bronchitic changes vs mild interstitial edema. Troponins are elevated at 6.80, 6.18. He was given lasix 40 mg IV and IV fluids stopped.   Pt has history of diastolic dysfunction with Echo in 01/2016 EF 60-65%, grade 1 DD, mild MR. No history of MI or cardiac event. Pt lives with his son but was reportedly found in poor living conditions, was soiled with urine.   Currently the patient is without dyspnea, still on BiPap. He denies chest pain. His lungs are with rhonchi throughout. The patient is oriented to self and place. He states that he had "some" chest pain prior to presentation. He is not able to provide further details. He denies any previous heart issues, high cholesterol, diabetes or seeing a cardiologist. He says that he has had some high blood pressure in the past, but does not take medication. He quit smoking about 30 years ago and does not drink alcohol.   Past Medical History:  Diagnosis Date  . Chronic diastolic CHF (congestive heart failure) (HCC) 08/09/2017  . Dementia   . Gout     Past Surgical History:  Procedure Laterality Date  . SKIN GRAFT Right      Home  Medications:  Prior to Admission medications   Medication Sig Start Date End Date Taking? Authorizing Provider  acetaminophen (TYLENOL) 500 MG tablet Take 500 mg by mouth every 6 (six) hours as needed for mild pain or moderate pain.    Yes [provider]    Inpatient Medications: Scheduled Meds: . aspirin  324 mg Oral Daily  . atorvastatin  40 mg Oral q1800  . carvedilol  3.125 mg Oral BID WC  . heparin subcutaneous  5,000 Units Subcutaneous Q8H  . ipratropium  0.5 mg Nebulization Q6H  . levalbuterol  1.25 mg Nebulization Q6H   Continuous Infusions: . azithromycin    . piperacillin-tazobactam (ZOSYN)  IV    . vancomycin     PRN Meds: acetaminophen, dextromethorphan-guaiFENesin, hydrALAZINE, morphine injection, nitroGLYCERIN, ondansetron, zolpidem  Allergies:   No Known Allergies  Social History:   Social History   Social History  . Marital status: Widowed    Spouse name: N/A  . Number of children: 1  . Years of education: 8   Occupational History  . REtired     Social History Main Topics  . Smoking status: Former Games developer  . Smokeless tobacco: Never Used  . Alcohol use No  . Drug use: No  . Sexual activity: No   Other Topics Concern  . Not on file   Social History Narrative   Lives at [facility]    [address], ([address]), [place],  [place] [address]   Fax: [phone]   Res No: [id number]   Unit room bed: B1203 P   Admit date 05/07/16   Drinks coffee daily (decaf)    Family History:    Family History  Problem Relation Age of Onset  . Heart disease Brother   . Cancer Sister        "throat" cancer     ROS:  Please see the history of present illness.  ROS  All other ROS reviewed and negative.     Physical Exam/Data:   Vitals:   08/09/17 0530 08/09/17 0545 08/09/17 0600 08/09/17 0700  BP: 125/89 120/88 (!) 129/98 118/77  Pulse: (!) 120 (!) 116 (!) 115 (!) 107  Resp: 15 (!) 21 20 20   Temp:    98.6 F (37 C)    TempSrc:    Axillary  SpO2: 98% 97% 98% 100%  Weight:      Height:        Intake/Output Summary (Last 24 hours) at 08/09/17 0733 Last data filed at 08/09/17 0512  Gross per 24 hour  Intake             1675 ml  Output                0 ml  Net             1675 ml   Filed Weights   08/09/17 0234  Weight: 160 lb (72.6 kg)   Body mass index is 23.63 kg/m.  General:  Well nourished, well developed, in no acute distress HEENT: normal Lymph: no adenopathy Neck: no JVD Endocrine:  No thryomegaly Vascular: No carotid bruits; FA pulses 2+ bilaterally without bruits  Cardiac:  normal S1, S2; RRR; no murmur -difficult to auscultate due to loud lung sounds Lungs:  Course rhonchi throughout Abd: soft, nontender, no hepatomegaly  Ext: no edema Musculoskeletal:  No deformities, BUE and BLE strength normal and equal Skin: warm and dry  Neuro:  CNs 2-12 intact, no focal abnormalities noted Psych:  Normal affect   EKG:  The EKG was personally reviewed and demonstrates:  Sinus tachycardia, 132 bpm, LBBB, PAC's Repeat EKG this am shows sinus tach 105 bpm, with T wave changes in V3-6.  Telemetry:  Telemetry was personally reviewed and demonstrates:  Sinus tachydaria, was in the 130's, has trended down to low 100's.  Relevant CV Studies:  Echocardiogram 02/04/16 Study Conclusions - Left ventricle: The cavity size was normal. There was mild focal   basal hypertrophy of the septum. Systolic function was normal.   The estimated ejection fraction was in the range of 60% to 65%.   Wall motion was normal; there were no regional wall motion   abnormalities. Doppler parameters are consistent with abnormal   left ventricular relaxation (grade 1 diastolic dysfunction). - Aortic valve: Trileaflet; mildly thickened, mildly calcified   leaflets. Transvalvular velocity was within the normal range.   There was no stenosis. - Mitral valve: Calcified annulus. There was mild regurgitation.  Laboratory  Data:  Chemistry Recent Labs Lab 08/09/17 0246  NA 140  K 3.8  CL 101  CO2 25  GLUCOSE 169*  BUN 17  CREATININE 1.28*  CALCIUM 8.8*  GFRNONAA 52*  GFRAA >60  ANIONGAP 14     Recent Labs Lab 08/09/17 0246  PROT 7.7  ALBUMIN 3.9  AST 95*  ALT 22  ALKPHOS 47  BILITOT 0.6   Hematology Recent Labs Lab 08/09/17 0246  WBC 18.1*  RBC  4.57  HGB 15.1  HCT 44.5  MCV 97.4  MCH 33.0  MCHC 33.9  RDW 13.8  PLT 213   Cardiac Enzymes Recent Labs Lab 08/09/17 0426  TROPONINI 6.18*    Recent Labs Lab 08/09/17 0259  TROPIPOC 6.80*    BNP Recent Labs Lab 08/09/17 0448  BNP 345.2*    DDimer No results for input(s): DDIMER in the last 168 hours.  Radiology/Studies:  Dg Chest Port 1 View  Result Date: 08/09/2017 CLINICAL DATA:  Fever and cough, ex-smoker. EXAM: PORTABLE CHEST 1 VIEW COMPARISON:  05/04/2016 FINDINGS: The heart size and mediastinal contours are within normal limits. Nonspecific mild diffuse interstitial prominence is noted which may represent mild interstitial edema or bronchitic change some considerations. No significant effusion or pneumothorax. No confluent airspace disease. No acute nor suspicious osseous abnormality. IMPRESSION: Nonspecific mild diffuse interstitial prominence with differential possibilities that may include bronchitic change or mild interstitial edema. Given history of fever and cough, favor bronchitic change. Electronically Signed   By: Tollie Ethavid  Kwon M.D.   On: 08/09/2017 03:14    Assessment and Plan:   1. NSTEMI:  Pt admitted with acute respiratory failure and sepsis. Has elevated troponin 6.8. Possibly due to demand ischemia although troponin higher than expected for sepsis. EKG shows tachycardia likely related to fever/sepsis and LBBB which was present in 2017. Repeat EKG now that HR is down to low 100's. Pt denies chest pain, ?"some" chest pain prior to presentation, but pt is not able to give specifics. Continue to cycle  troponins. Check echo for LV function and wall motion. Aspirin and statin initiated. Would defer cath for now and treat sepsis since no chest pain and Vital signs stable. Revisit ischemic workup after further troponins trended and pt more stable. -- Repeat EKG this am shows sinus tach 105 bpm, with T wave changes in V3-6.   2. Diastolic heart failure: Hx of normal LVEF and grade 1 DD from 01/2016. Pt developed respiratory failure with rhonchi and crackles after 2L IV bolus. BNP 345.2. Was given lasix 40 mg IV with 1.6L UOP. Pt breathing better, still on BiPap. Started on low dose carvedilol. Continue to monitor. Can try slower rate of IV fluids in setting of sepsis.   3. Acute respiratory failure/sepsis: Pt admitted with fever, hypercapnia, leukocytosis. Started on IV antibiotics, BiPap and nebs. Initial CXR shows bronchitis changes vs mild interstitial edema. Repeat CXR being done now. Re-evaluate since IV hydration.  4. Acute renal insufficiency: SCr 1.28. Likely related to poor oral intake and sepsis.    For questions or updates, please contact [facility] Please consult [email or web address] for contact info under Cardiology/STEMI.   Signed, Berton BonJanine Verdell Kincannon, NP  08/09/2017 7:33 AM

## 2017-08-09 NOTE — ED Triage Notes (Signed)
PT presents from home for evaluation of shortness of breath and decreased lung fields on right side. EMS advised pt was saturated with urine on arrival and nonambulatory. Pt was given 1000mg  Tylenol PO, 125MG  SOLUMEDROL IVP, 4MG  IVP, 5MG  ALBUTEROL during transport. EMS advised call was initiated for vomiting and knee pain. Pt has prior hx of dementia.

## 2017-08-09 NOTE — Progress Notes (Signed)
Pt transported to ICU on BIPAP without complication.  RT to monitor and assess as needed.  

## 2017-08-09 NOTE — Progress Notes (Signed)
Obtained a telephone verbal consent from patients son Adalberto Illllen Weidmann with myself and Army MeliaLaura Murphy, RN. Patient's son consented for his father who has dementia and agreed to the procedure and had no questions or concerns.

## 2017-08-09 NOTE — Progress Notes (Addendum)
Day of Surgery Procedure(s) (LRB): LEFT HEART CATH AND CORONARY ANGIOGRAPHY (N/A) Subjective: Patient examined, coronary angiogram and echocardiogram images personally reviewed  76 year old male with history of heart failure, gout, dementia. He presented to the ED with hypotension, fever 101 WBC 18k  acidosis and altered mental status. He was started on antibiotics and IV fluids and developed acute pulmonary edema requiring BiPAP. His troponins were significantly elevated and an echocardiogram showed severe LV dysfunction with moderate mitral regurgitation and mild-moderate tricuspid regurgitation. He underwent Left heart cath today which showed LVEDP of 31, left main stenosis, chronic occlusion the RCA and LAD and proximal stenosis of the circumflex.  The patient appears to be septic with cardiogenic shock and a non-ST elevation MI, Troponin 28.0 The patient would not survive CABG with combined valve repair-replacement.    The patient has a poor functional status and uises a wheelchair at home due to difficulty walking Objective: Vital signs in last 24 hours: Temp:  [97.4 F (36.3 C)-99.2 F (37.3 C)] 97.9 F (36.6 C) (10/22 1200) Pulse Rate:  [52-134] 113 (10/22 1837) Cardiac Rhythm: Sinus tachycardia (10/22 1824) Resp:  [0-32] 22 (10/22 1837) BP: (98-149)/(64-113) 112/79 (10/22 1837) SpO2:  [0 %-100 %] 97 % (10/22 1837) FiO2 (%):  [40 %-100 %] 40 % (10/22 0826) Weight:  [160 lb (72.6 kg)] 160 lb (72.6 kg) (10/22 0234)  Hemodynamic parameters for last 24 hours:  Hypotension febrile unstable  Intake/Output from previous day: 10/21 0701 - 10/22 0700 In: 1675 [I.V.:125; IV Piggyback:1550] Out: -  Intake/Output this shift: Total I/O In: 359 [I.V.:59; IV Piggyback:300] Out: -       Physical Exam  General: Mildly obese Male, appears confused HEENT: Normocephalic pupils equal , dentition adequate Neck: Supple without JVD, adenopathy, or bruit Chest: Increased respiratory rate  with course rhonchi, no tenderness             or deformity Cardiovascular:Tachycardia, Regular rate and rhythm, 2/6 holosystolic Murmur         Peripheral pulses not    palpable in Lowerextremities Abdomen:  Soft, nontender, no palpable mass or organomegaly Extremities: Warm, well-perfused, no clubbing cyanosis edema or tenderness,              no venous stasis changes of the legs Rectal/GU: Deferred Neuro: Grossly non--focal and symmetrical throughout Skin: Clean and dry without rash or ulceration    Lab Results:  Recent Labs  08/09/17 0246  WBC 18.1*  HGB 15.1  HCT 44.5  PLT 213   BMET:  Recent Labs  08/09/17 0246  NA 140  K 3.8  CL 101  CO2 25  GLUCOSE 169*  BUN 17  CREATININE 1.28*  CALCIUM 8.8*    PT/INR:  Recent Labs  08/09/17 0720  LABPROT 13.5  INR 1.04   ABG    Component Value Date/Time   PHART 7.369 08/09/2017 0819   HCO3 20.4 08/09/2017 0819   ACIDBASEDEF 3.8 (H) 08/09/2017 0819   O2SAT 99.6 08/09/2017 0819   CBG (last 3)  No results for input(s): GLUCAP in the last 72 hours.  Assessment/Plan: S/P Procedure(s) (LRB): LEFT HEART CATH AND CORONARY ANGIOGRAPHY (N/A) Acute MI with cardiogenic shock, Mitral regurgitation, severe CAD Metabolic acidosis Fever with possible pneumonia Patient is not a candidate for emergency CABG due to his severe LV dysfunction. He would not survive CABG with combined valve repair-placement. Recommend post-MI care, palliative care consult   LOS: 0 days    Kathlee Nationseter Van Trigt III 08/09/2017

## 2017-08-09 NOTE — Progress Notes (Addendum)
Pharmacy Antibiotic Note  Jacob Barajas is a 76 y.o. male with respiratory distress found to have troponin = 6.80 admitted on 08/09/2017 with sepsis.  Pharmacy has been consulted for zosyn and vancomycin dosing.  Cards recommends holding heparin for now and they will consult this am.   Plan: Zosyn 3.375g IV q8h (4 hour infusion).  Vancomycin 1 Gm x1 then 750 mg IV q12h VT=15-20 mg/L Adjusted Zmax to 500 mg IV q24h for bronchitis vs PNA Daily Scr F/u levels and cultures   Height: 5\' 9"  (175.3 cm) Weight: 160 lb (72.6 kg) IBW/kg (Calculated) : 70.7  Temp (24hrs), Avg:99.2 F (37.3 C), Min:99.2 F (37.3 C), Max:99.2 F (37.3 C)   Recent Labs Lab 08/09/17 0246 08/09/17 0301  WBC 18.1*  --   CREATININE 1.28*  --   LATICACIDVEN  --  3.63*    Estimated Creatinine Clearance: 48.3 mL/min (A) (by C-G formula based on SCr of 1.28 mg/dL (H)).    No Known Allergies  Antimicrobials this admission: 10/22 zosyn >>  10/22 vancomycin >>  10/22 zmax >>   Dose adjustments this admission:   Microbiology results:  BCx:   UCx:    Sputum:    MRSA PCR:   Thank you for allowing pharmacy to be a part of this patient's care.  Lorenza EvangelistGreen, Annalisa Colonna R 08/09/2017 5:00 AM

## 2017-08-09 NOTE — ED Notes (Signed)
Date and time results received: 08/09/17 5:26 AM  (use smartphrase ".now" to insert current time)  Test:Troponin Critical Value: 6.18  Name of Provider Notified: NIU Orders Received? Or Actions Taken?:

## 2017-08-09 NOTE — ED Notes (Signed)
Bed: RESB Expected date:  Expected time:  Means of arrival:  Comments: 76 yo M/Resp distress

## 2017-08-09 NOTE — Progress Notes (Signed)
ANTICOAGULATION CONSULT NOTE - Follow Up Consult  Pharmacy Consult for Heparin  Indication: Severe CAD  No Known Allergies  Patient Measurements: Height: 5\' 9"  (175.3 cm) Weight: 160 lb (72.6 kg) IBW/kg (Calculated) : 70.7 Heparin Dosing Weight: 72 kg  Vital Signs: Temp: 97.9 F (36.6 C) (10/22 1200) Temp Source: Axillary (10/22 1200) BP: 98/66 (10/22 1638) Pulse Rate: 99 (10/22 1638)  Labs:  Recent Labs  08/09/17 0246 08/09/17 0426 08/09/17 0720 08/09/17 1025  HGB 15.1  --   --   --   HCT 44.5  --   --   --   PLT 213  --   --   --   APTT  --   --  28  --   LABPROT  --   --  13.5  --   INR  --   --  1.04  --   CREATININE 1.28*  --   --   --   TROPONINI  --  6.18* 15.25* 27.86*    Estimated Creatinine Clearance: 48.3 mL/min (A) (by C-G formula based on SCr of 1.28 mg/dL (H)).  Assessment: [age] YOM who presented on 10/22 with NSTEMI. The patient is s/p a cardiac cath this evening that revealed severe CAD. Pharmacy consulted to resume Heparin 2 hours post sheath removal without a bolus.   Per cath report - the patient's sheath was removed at 1645.   Goal of Therapy:  Heparin level 0.3-0.7 units/ml Monitor platelets by anticoagulation protocol: Yes   Plan:  1. Restart Heparin at 900 units/hr (9 m/hr) starting at 1900 today 2. Will continue to monitor for any signs/symptoms of bleeding and will follow up with heparin level in 8 hours   Thank you for allowing pharmacy to be a part of this patient's care.  Georgina PillionElizabeth Feras Gardella, PharmD, BCPS Clinical Pharmacist Pager: [phone] If after 3:30p, please call main pharmacy at: [phone] 08/09/2017 5:17 PM

## 2017-08-09 NOTE — Progress Notes (Signed)
Patient arrived to cath holding lab [facility] Patient was alert and oriented to self and place due to patient's hx of dementia. Patient's IV was connected to IV fluids and was placed on the monitor. Patients VS were WNLs. Patient had no complaints of CP. Will continue to monitor patient until he is ready for his cath procedure.

## 2017-08-09 NOTE — Interval H&P Note (Signed)
Cath Lab Visit (complete for each Cath Lab visit)  Clinical Evaluation Leading to the Procedure:   ACS: Yes.    Non-ACS:    Anginal Classification: CCS III  Anti-ischemic medical therapy: No Therapy  Non-Invasive Test Results: No non-invasive testing performed  Prior CABG: No previous CABG      History and Physical Interval Note:  08/09/2017 4:19 PM  Jacob Barajas  has presented today for surgery, with the diagnosis of cp  The various methods of treatment have been discussed with the patient and family. After consideration of risks, benefits and other options for treatment, the patient has consented to  Procedure(s): LEFT HEART CATH AND CORONARY ANGIOGRAPHY (N/A) as a surgical intervention .  The patient's history has been reviewed, patient examined, no change in status, stable for surgery.  I have reviewed the patient's chart and labs.  Questions were answered to the patient's satisfaction.     Nanetta BattyBerry, Dezire Turk

## 2017-08-09 NOTE — H&P (Addendum)
History and Physical    Jacob LollJohn Hufstetler ZOX:[id number] DOB: [birth year] DOA: 08/09/2017  Referring MD/NP/PA:   PCP: Patient, No Pcp Per   Patient coming from:  The patient is coming from home.  At baseline, pt is independent for most of ADL.  Chief Complaint: SOB, hypotension and fever  HPI: Jacob Barajas is a 76 y.o. male with medical history significant of gout, dementia, dCHF (by 2d echo on 02/04/16, who shortness breath, hypotension and fever.  Patient has dementia, and is unable to provide accurate medical history, therefore, most of the history is obtained by discussing the case with ED physician, per EMS report, and with the nursing staff. The history is limited.   Per EMS report, they were called out secondary to patient nausea vomiting and vomiting. Patient was found to have fever of 11.8, hypotension with blood pressure 80/40, tachycardia with heart rate up to 130, all seemed desaturation to 80% on room air. When I saw pt in ED, he is alert. He knows that he is in hospital and can tell his own name, but not oriented to time. He has dry cough when he talks to me. He denies abdominal pain or diarrhea. Denies symptoms of UTI. He moves all normally.  After received approximately 2 L normal saline bolus in ED, his respiratory distress has suddenly worsened. His respiratory rate is up to 40s, oxygen desaturated to 80s. He has diffuse rhonchi and crackles on auscultation. 40 mg of Lasix was ordered by me for suspected fluid overload. BiPAP was started. Patient's oxygen saturation is back to 97% and respiratory rate decreased to 20s with BiPAP. Stat ABG showed pH of 7.238, PCO2 49.9, PO2 42.  ED Course: pt was found to have SBP 110-120, tachycardia with heart rate 120-130, tachypnea, temperature 99.2, WBC 18.1, lactic acid is 3.63, acute renal injury with creatinine 1.28, troponin 6.8, chest x-ray showed possible bronchitic change and vascular cephalization. Pt is admited to stepdown as inpatient.  Cardiology, Dr. Cristal Deerhristopher was consulted by EDP.  Review of Systems:   General: has fevers, chills, has fatigue HEENT: no blurry vision, hearing changes or sore throat Respiratory: has dyspnea, coughing, no wheezing CV: no chest pain, no palpitations GI: has nausea, vomiting and abdominal distention. No abdominal pain, diarrhea, constipation GU: no dysuria, burning on urination, increased urinary frequency, hematuria  Ext: no leg edema Neuro: no unilateral weakness, numbness, or tingling, no vision change or hearing loss Skin: no rash, no skin tear. MSK: No muscle spasm, no deformity, no limitation of range of movement in spin Heme: No easy bruising.  Travel history: No recent long distant travel.  Allergy: No Known Allergies  Past Medical History:  Diagnosis Date  . Chronic diastolic CHF (congestive heart failure) (HCC) 08/09/2017  . Dementia   . Gout     Past Surgical History:  Procedure Laterality Date  . SKIN GRAFT Right     Social History:  reports that he has quit smoking. He has never used smokeless tobacco. He reports that he does not drink alcohol or use drugs.  Family History:  Family History  Problem Relation Age of Onset  . Heart disease Brother   . Cancer Sister        "throat" cancer     Prior to Admission medications   Medication Sig Start Date End Date Taking? Authorizing Provider  acetaminophen (TYLENOL) 500 MG tablet Take 500 mg by mouth every 6 (six) hours as needed for mild pain or moderate pain.  Yes [provider]    Physical Exam: Vitals:   08/09/17 0430 08/09/17 0446 08/09/17 0500 08/09/17 0515  BP: (!) 149/91 (!) 116/97 (!) 116/97   Pulse: (!) 130 97    Resp: (!) 26 (!) 27 (!) 32   Temp:      TempSrc:      SpO2: 94% (!) 88%  99%  Weight:      Height:       General: Not in acute distress HEENT:       Eyes: PERRL, EOMI, no scleral icterus.       ENT: No discharge from the ears and nose, no pharynx injection, no tonsillar  enlargement.        Neck: No JVD, no bruit, no mass felt. Heme: No neck lymph node enlargement. Cardiac: S1/S2, RRR, No murmurs, No gallops or rubs. Respiratory: has diffused rhonchi bilaterally. Has rales bilaterally. GI: Soft, nondistended, nontender, no rebound pain, no organomegaly, BS present. GU: No hematuria Ext: No pitting leg edema bilaterally. 2+DP/PT pulse bilaterally. Musculoskeletal: No joint deformities, No joint redness or warmth, no limitation of ROM in spin. Skin: No rashes.  Neuro: He knows that he is in hospital and can tell his own name, but not oriented to time. Cranial nerves II-XII grossly intact, moves all extremities normally.  Psych: Patient is not psychotic.  Labs on Admission: I have personally reviewed following labs and imaging studies  CBC:  Recent Labs Lab 08/09/17 0246  WBC 18.1*  NEUTROABS 13.7*  HGB 15.1  HCT 44.5  MCV 97.4  PLT 213   Basic Metabolic Panel:  Recent Labs Lab 08/09/17 0246  NA 140  K 3.8  CL 101  CO2 25  GLUCOSE 169*  BUN 17  CREATININE 1.28*  CALCIUM 8.8*   GFR: Estimated Creatinine Clearance: 48.3 mL/min (A) (by C-G formula based on SCr of 1.28 mg/dL (H)). Liver Function Tests:  Recent Labs Lab 08/09/17 0246  AST 95*  ALT 22  ALKPHOS 47  BILITOT 0.6  PROT 7.7  ALBUMIN 3.9   No results for input(s): LIPASE, AMYLASE in the last 168 hours. No results for input(s): AMMONIA in the last 168 hours. Coagulation Profile: No results for input(s): INR, PROTIME in the last 168 hours. Cardiac Enzymes:  Recent Labs Lab 08/09/17 0426  TROPONINI 6.18*   BNP (last 3 results) No results for input(s): PROBNP in the last 8760 hours. HbA1C: No results for input(s): HGBA1C in the last 72 hours. CBG: No results for input(s): GLUCAP in the last 168 hours. Lipid Profile: No results for input(s): CHOL, HDL, LDLCALC, TRIG, CHOLHDL, LDLDIRECT in the last 72 hours. Thyroid Function Tests: No results for input(s): TSH,  T4TOTAL, FREET4, T3FREE, THYROIDAB in the last 72 hours. Anemia Panel: No results for input(s): VITAMINB12, FOLATE, FERRITIN, TIBC, IRON, RETICCTPCT in the last 72 hours. Urine analysis:    Component Value Date/Time   COLORURINE ORANGE (A) 05/04/2016 1118   APPEARANCEUR CLOUDY (A) 05/04/2016 1118   LABSPEC 1.029 05/04/2016 1118   PHURINE 5.5 05/04/2016 1118   GLUCOSEU NEGATIVE 05/04/2016 1118   HGBUR SMALL (A) 05/04/2016 1118   BILIRUBINUR SMALL (A) 05/04/2016 1118   KETONESUR NEGATIVE 05/04/2016 1118   PROTEINUR 30 (A) 05/04/2016 1118   NITRITE POSITIVE (A) 05/04/2016 1118   LEUKOCYTESUR TRACE (A) 05/04/2016 1118   Sepsis Labs: @LABRCNTIP (procalcitonin:4,lacticidven:4) )No results found for this or any previous visit (from the past 240 hour(s)).   Radiological Exams on Admission: Dg Chest [facility]  Result Date: 08/09/2017 CLINICAL DATA:  Fever and cough, ex-smoker. EXAM: PORTABLE CHEST 1 VIEW COMPARISON:  05/04/2016 FINDINGS: The heart size and mediastinal contours are within normal limits. Nonspecific mild diffuse interstitial prominence is noted which may represent mild interstitial edema or bronchitic change some considerations. No significant effusion or pneumothorax. No confluent airspace disease. No acute nor suspicious osseous abnormality. IMPRESSION: Nonspecific mild diffuse interstitial prominence with differential possibilities that may include bronchitic change or mild interstitial edema. Given history of fever and cough, favor bronchitic change. Electronically Signed   By: Tollie Eth M.D.   On: 08/09/2017 03:14     EKG: Independently reviewed.  Not done in ED, will get one.   Assessment/Plan Principal Problem:   Acute respiratory failure with hypoxia and hypercapnia (HCC) Active Problems:   Gout   Dementia   NSTEMI (non-ST elevated myocardial infarction) (HCC)   Sepsis (HCC)   Bronchitis   AKI (acute kidney injury) (HCC)   Nausea & vomiting   Abdominal  distension   Chronic diastolic CHF (congestive heart failure) (HCC)   Acute respiratory failure with hypoxia and hypercapnia and sepsis: patient presents with respiratory distress. Initially his respiratory distress is most likely caused by acute bronchitis versus aspiration pneumonia given nausea or vomiting. After received approximately 2 L normal saline bolus in ED, his respiratory distress has suddenly worsened. He has diffuse rhonchi and crackles on auscultation. He may have had acute fluid overload. Patient meets criteria for sepsis with hypotension, leukocytosis, fever, tachycardia and tachypnea. Patient's Lactic acid is elevated. Per report, patient was given normal saline 125 mL/h by EMS. At arrival to ED, patient's blood pressure is normal. Currently hemodynamically stable.  - will admit to SUD bed as inpt - continue BiPAP - IV Vancomycin, Zosyn, azithromycin - Mucinex for cough  - prn atrovent and Xopenex Neb prn for SOB - Urine legionella and S. pneumococcal antigen - Follow up blood culture x2, sputum culture and respiratory virus panel, plus Flu pcr - will get Procalcitonin and trend lactic acid level per sepsis protocol - IVF: ~2L of NS bolus in ED. Will hold further IVF due to concerning for acute fluid overload. Pending BNP. If BNP is not Elevated significantly, will resume IV fluid at low rate, 50 mL/h.  Chronic diastolic CHF: patient does not carry this diagnosis, but his 2-D echo on 02/03/69 showed EF 60-65% with grade 1 diastolic dysfunction. Patient does not have leg edema or JVD. Chest x-ray showed vascular cephalization. And he had acute worsening respiratory distress after received 2 L normal saline bolus, indicating possible fluid overload acutely. -Hold IV fluid and now -I ordered 40 mg of IV lasix -Check BNP -f/u 2d echo.  NSTEMI (non-ST elevated myocardial infarction) [facility]): troponin 6.8. Patient denies chest pain Possibly due to demand ischemia. Cardiology, Dr.  Cristal Deer was consulted by EDP, recommended to hold IV heparin now. - cycle CE q6 x3 and repeat EKG in the am  - prn Nitroglycerin, Morphine - start aspirin, lipitor and coreg - Risk factor stratification: will check FLP and A1C  - 2d echo - Card will consult-->f/u recommendations.  Mild AKI: Cre 1.28 and BUN 17. Likely due to prerenal secondary to dehydration. ATN is also possible given episode of hypotension - IVF as above - Follow up renal function by BMP  Nausea & vomiting and Abdominal distension: etiology is not clear. Patient denies abdominal pain. -Check lipase -CT abdomen/pelvis without contrast -Karen Zofran for nausea and morphine for pain  Hx of Gout:  seems to be stable -observe closely   DVT ppx: SQ Heparin   Code Status: Full code (Not sure about his code status, he was full code in previious admission on 05/04/16, will temporarily put full code. This needs to be addressed in the morning again).   Addendum: later on I called his sister, who states that pt should be on full code and she will discuss with family members further.  Family Communication: None at bed side.   Disposition Plan:  Anticipate discharge back to previous home environment Consults called:  Card, Dr. Cristal Deer was consulted. Admission status:  SDU/inpation       Date of Service 08/09/2017    Lorretta Harp [facility] Pager [phone]  If 7PM-7AM, please contact night-coverage [email or web address] Password [email or web address] 08/09/2017, 5:49 AM

## 2017-08-09 NOTE — Consult Note (Signed)
Cardiology Consultation:   Patient ID: Jacob Barajas; [id number]; 19-Jul-1941   Admit date: 08/09/2017 Date of Consult: 08/09/2017  Primary Care Provider: Patient, No Pcp Per Primary Cardiologist: New- Dr Duke Salvia   Patient Profile:   Jacob Barajas is a 76 y.o. male with a hx of gout, dementia, diastolic CHF who is being seen today for the evaluation of NSTEMI at the request of Dr. Clyde Lundborg.  History of Present Illness:   Jacob Barajas presented to [facility] ED on 08/09/17 for shortness of breath, hypotension, and fever. He was found to be in acute respiratory failure and sepsis with fever, leukocytosis, elevated lactic acid and hypercapnia. After being given 2L IV fluids in the ED his respiratory status worsened with rhonchi and crackles on auscultation. He was placed on BiPap and IV antibiotics. BNP was 345.2. CXR showed bronchitic changes vs mild interstitial edema. Troponins are elevated at 6.80, 6.18. He was given lasix 40 mg IV and IV fluids stopped.   Pt has history of diastolic dysfunction with Echo in 01/2016 EF 60-65%, grade 1 DD, mild MR. No history of MI or cardiac event. Pt lives with his son but was reportedly found in poor living conditions, was soiled with urine.   Currently the patient is without dyspnea, still on BiPap. He denies chest pain. His lungs are with rhonchi throughout. The patient is oriented to self and place. He states that he had "some" chest pain prior to presentation. He is not able to provide further details. He denies any previous heart issues, high cholesterol, diabetes or seeing a cardiologist. He says that he has had some high blood pressure in the past, but does not take medication. He quit smoking about 30 years ago and does not drink alcohol.   Past Medical History:  Diagnosis Date  . Chronic diastolic CHF (congestive heart failure) (HCC) 08/09/2017  . Dementia   . Gout     Past Surgical History:  Procedure Laterality Date  . SKIN GRAFT Right      Home  Medications:  Prior to Admission medications   Medication Sig Start Date End Date Taking? Authorizing Provider  acetaminophen (TYLENOL) 500 MG tablet Take 500 mg by mouth every 6 (six) hours as needed for mild pain or moderate pain.    Yes [provider]    Inpatient Medications: Scheduled Meds: . aspirin  324 mg Oral Daily  . atorvastatin  40 mg Oral q1800  . carvedilol  3.125 mg Oral BID WC  . heparin subcutaneous  5,000 Units Subcutaneous Q8H  . ipratropium  0.5 mg Nebulization Q6H  . levalbuterol  1.25 mg Nebulization Q6H   Continuous Infusions: . azithromycin    . piperacillin-tazobactam (ZOSYN)  IV    . vancomycin     PRN Meds: acetaminophen, dextromethorphan-guaiFENesin, hydrALAZINE, morphine injection, nitroGLYCERIN, ondansetron, zolpidem  Allergies:   No Known Allergies  Social History:   Social History   Social History  . Marital status: Widowed    Spouse name: N/A  . Number of children: 1  . Years of education: 8   Occupational History  . REtired     Social History Main Topics  . Smoking status: Former Games developer  . Smokeless tobacco: Never Used  . Alcohol use No  . Drug use: No  . Sexual activity: No   Other Topics Concern  . Not on file   Social History Narrative   Lives at [facility]    [address], ([address]), [place],  [place] [address]   Fax: [phone]   Res No: [id number]   Unit room bed: B1203 P   Admit date 05/07/16   Drinks coffee daily (decaf)    Family History:    Family History  Problem Relation Age of Onset  . Heart disease Brother   . Cancer Sister        "throat" cancer     ROS:  Please see the history of present illness.  ROS  All other ROS reviewed and negative.     Physical Exam/Data:   Vitals:   08/09/17 0530 08/09/17 0545 08/09/17 0600 08/09/17 0700  BP: 125/89 120/88 (!) 129/98 118/77  Pulse: (!) 120 (!) 116 (!) 115 (!) 107  Resp: 15 (!) 21 20 20   Temp:    98.6 F (37 C)    TempSrc:    Axillary  SpO2: 98% 97% 98% 100%  Weight:      Height:        Intake/Output Summary (Last 24 hours) at 08/09/17 0733 Last data filed at 08/09/17 0512  Gross per 24 hour  Intake             1675 ml  Output                0 ml  Net             1675 ml   Filed Weights   08/09/17 0234  Weight: 160 lb (72.6 kg)   Body mass index is 23.63 kg/m.  General:  Well nourished, well developed, in no acute distress HEENT: normal Lymph: no adenopathy Neck: no JVD Endocrine:  No thryomegaly Vascular: No carotid bruits; FA pulses 2+ bilaterally without bruits  Cardiac:  normal S1, S2; RRR; no murmur -difficult to auscultate due to loud lung sounds Lungs:  Course rhonchi throughout Abd: soft, nontender, no hepatomegaly  Ext: no edema Musculoskeletal:  No deformities, BUE and BLE strength normal and equal Skin: warm and dry  Neuro:  CNs 2-12 intact, no focal abnormalities noted Psych:  Normal affect   EKG:  The EKG was personally reviewed and demonstrates:  Sinus tachycardia, 132 bpm, LBBB, PAC's Repeat EKG this am shows sinus tach 105 bpm, with T wave changes in V3-6.  Telemetry:  Telemetry was personally reviewed and demonstrates:  Sinus tachydaria, was in the 130's, has trended down to low 100's.  Relevant CV Studies:  Echocardiogram 02/04/16 Study Conclusions - Left ventricle: The cavity size was normal. There was mild focal   basal hypertrophy of the septum. Systolic function was normal.   The estimated ejection fraction was in the range of 60% to 65%.   Wall motion was normal; there were no regional wall motion   abnormalities. Doppler parameters are consistent with abnormal   left ventricular relaxation (grade 1 diastolic dysfunction). - Aortic valve: Trileaflet; mildly thickened, mildly calcified   leaflets. Transvalvular velocity was within the normal range.   There was no stenosis. - Mitral valve: Calcified annulus. There was mild regurgitation.  Laboratory  Data:  Chemistry Recent Labs Lab 08/09/17 0246  NA 140  K 3.8  CL 101  CO2 25  GLUCOSE 169*  BUN 17  CREATININE 1.28*  CALCIUM 8.8*  GFRNONAA 52*  GFRAA >60  ANIONGAP 14     Recent Labs Lab 08/09/17 0246  PROT 7.7  ALBUMIN 3.9  AST 95*  ALT 22  ALKPHOS 47  BILITOT 0.6   Hematology Recent Labs Lab 08/09/17 0246  WBC 18.1*  RBC  4.57  HGB 15.1  HCT 44.5  MCV 97.4  MCH 33.0  MCHC 33.9  RDW 13.8  PLT 213   Cardiac Enzymes Recent Labs Lab 08/09/17 0426  TROPONINI 6.18*    Recent Labs Lab 08/09/17 0259  TROPIPOC 6.80*    BNP Recent Labs Lab 08/09/17 0448  BNP 345.2*    DDimer No results for input(s): DDIMER in the last 168 hours.  Radiology/Studies:  Dg Chest Port 1 View  Result Date: 08/09/2017 CLINICAL DATA:  Fever and cough, ex-smoker. EXAM: PORTABLE CHEST 1 VIEW COMPARISON:  05/04/2016 FINDINGS: The heart size and mediastinal contours are within normal limits. Nonspecific mild diffuse interstitial prominence is noted which may represent mild interstitial edema or bronchitic change some considerations. No significant effusion or pneumothorax. No confluent airspace disease. No acute nor suspicious osseous abnormality. IMPRESSION: Nonspecific mild diffuse interstitial prominence with differential possibilities that may include bronchitic change or mild interstitial edema. Given history of fever and cough, favor bronchitic change. Electronically Signed   By: Tollie Ethavid  Kwon M.D.   On: 08/09/2017 03:14    Assessment and Plan:   1. NSTEMI:  Pt admitted with acute respiratory failure and sepsis. Has elevated troponin 6.8. Possibly due to demand ischemia although troponin higher than expected for sepsis. EKG shows tachycardia likely related to fever/sepsis and LBBB which was present in 2017. Repeat EKG now that HR is down to low 100's. Pt denies chest pain, ?"some" chest pain prior to presentation, but pt is not able to give specifics. Continue to cycle  troponins. Check echo for LV function and wall motion. Aspirin and statin initiated. Would defer cath for now and treat sepsis since no chest pain and Vital signs stable. Revisit ischemic workup after further troponins trended and pt more stable. -- Repeat EKG this am shows sinus tach 105 bpm, with T wave changes in V3-6.   2. Diastolic heart failure: Hx of normal LVEF and grade 1 DD from 01/2016. Pt developed respiratory failure with rhonchi and crackles after 2L IV bolus. BNP 345.2. Was given lasix 40 mg IV with 1.6L UOP. Pt breathing better, still on BiPap. Started on low dose carvedilol. Continue to monitor. Can try slower rate of IV fluids in setting of sepsis.   3. Acute respiratory failure/sepsis: Pt admitted with fever, hypercapnia, leukocytosis. Started on IV antibiotics, BiPap and nebs. Initial CXR shows bronchitis changes vs mild interstitial edema. Repeat CXR being done now. Re-evaluate since IV hydration.  4. Acute renal insufficiency: SCr 1.28. Likely related to poor oral intake and sepsis.    For questions or updates, please contact [facility] Please consult [email or web address] for contact info under Cardiology/STEMI.   Signed, Berton BonJanine Charolett Yarrow, NP  08/09/2017 7:33 AM

## 2017-08-09 NOTE — Progress Notes (Signed)
This RN observed Pt's WOB and RR increasing. Pt turned dusky when laid flat to reposition in bed. Spo2 on RA 95%. Fine crackles auscultated in the bases. Cath lab fluids stopped. On call cardiology NP paged and updated. Orders given for Lasix 80mg  IV once, and Potassium 40meq PO once.

## 2017-08-10 ENCOUNTER — Inpatient Hospital Stay (HOSPITAL_COMMUNITY): Payer: PPO

## 2017-08-10 ENCOUNTER — Encounter (HOSPITAL_COMMUNITY): Payer: Self-pay | Admitting: Cardiovascular Disease

## 2017-08-10 DIAGNOSIS — R570 Cardiogenic shock: Secondary | ICD-10-CM

## 2017-08-10 DIAGNOSIS — I5041 Acute combined systolic (congestive) and diastolic (congestive) heart failure: Secondary | ICD-10-CM

## 2017-08-10 LAB — LEGIONELLA PNEUMOPHILA SEROGP 1 UR AG: L. PNEUMOPHILA SEROGP 1 UR AG: NEGATIVE

## 2017-08-10 LAB — COMPREHENSIVE METABOLIC PANEL
ALBUMIN: 3.4 g/dL — AB (ref 3.5–5.0)
ALT: 43 U/L (ref 17–63)
ANION GAP: 9 (ref 5–15)
AST: 207 U/L — ABNORMAL HIGH (ref 15–41)
Alkaline Phosphatase: 37 U/L — ABNORMAL LOW (ref 38–126)
BILIRUBIN TOTAL: 1.2 mg/dL (ref 0.3–1.2)
BUN: 23 mg/dL — ABNORMAL HIGH (ref 6–20)
CHLORIDE: 98 mmol/L — AB (ref 101–111)
CO2: 27 mmol/L (ref 22–32)
Calcium: 8.2 mg/dL — ABNORMAL LOW (ref 8.9–10.3)
Creatinine, Ser: 1.66 mg/dL — ABNORMAL HIGH (ref 0.61–1.24)
GFR calc Af Amer: 44 mL/min — ABNORMAL LOW (ref >60)
GFR calc non Af Amer: 38 mL/min — ABNORMAL LOW (ref >60)
GLUCOSE: 147 mg/dL — AB (ref 65–99)
POTASSIUM: 4.3 mmol/L (ref 3.5–5.1)
Sodium: 134 mmol/L — ABNORMAL LOW (ref 135–145)
TOTAL PROTEIN: 6.6 g/dL (ref 6.5–8.1)

## 2017-08-10 LAB — URINE CULTURE: Culture: NO GROWTH

## 2017-08-10 LAB — CBC
HEMATOCRIT: 39.3 % (ref 39.0–52.0)
HEMOGLOBIN: 13 g/dL (ref 13.0–17.0)
MCH: 32.4 pg (ref 26.0–34.0)
MCHC: 33.1 g/dL (ref 30.0–36.0)
MCV: 98 fL (ref 78.0–100.0)
Platelets: 193 10*3/uL (ref 150–400)
RBC: 4.01 MIL/uL — ABNORMAL LOW (ref 4.22–5.81)
RDW: 14.1 % (ref 11.5–15.5)
WBC: 21.1 10*3/uL — AB (ref 4.0–10.5)

## 2017-08-10 LAB — HEPARIN LEVEL (UNFRACTIONATED)
HEPARIN UNFRACTIONATED: 0.2 [IU]/mL — AB (ref 0.30–0.70)
Heparin Unfractionated: 0.16 IU/mL — ABNORMAL LOW (ref 0.30–0.70)

## 2017-08-10 MED ORDER — FUROSEMIDE 40 MG PO TABS
40.0000 mg | ORAL_TABLET | Freq: Every day | ORAL | Status: DC
  Administered 2017-08-10 – 2017-08-12 (×3): 40 mg via ORAL
  Filled 2017-08-10 (×3): qty 1

## 2017-08-10 MED ORDER — LEVALBUTEROL HCL 1.25 MG/0.5ML IN NEBU
1.2500 mg | INHALATION_SOLUTION | Freq: Four times a day (QID) | RESPIRATORY_TRACT | Status: DC | PRN
  Administered 2017-08-10 – 2017-08-11 (×2): 1.25 mg via RESPIRATORY_TRACT
  Filled 2017-08-10 (×2): qty 0.5

## 2017-08-10 MED ORDER — ACETAMINOPHEN 325 MG PO TABS: 650.0000 mg | ORAL_TABLET | ORAL | Status: DC | PRN

## 2017-08-10 MED ORDER — ISOSORBIDE DINITRATE 10 MG PO TABS
10.0000 mg | ORAL_TABLET | Freq: Three times a day (TID) | ORAL | Status: DC
  Administered 2017-08-10 (×3): 10 mg via ORAL
  Filled 2017-08-10 (×3): qty 1

## 2017-08-10 MED ORDER — HEPARIN BOLUS VIA INFUSION
1000.0000 [IU] | Freq: Once | INTRAVENOUS | Status: AC
  Administered 2017-08-10: 1000 [IU] via INTRAVENOUS
  Filled 2017-08-10: qty 1000

## 2017-08-10 MED ORDER — SODIUM CHLORIDE 0.9 % IV SOLN
INTRAVENOUS | Status: DC
  Administered 2017-08-10: 09:00:00 via INTRAVENOUS

## 2017-08-10 MED ORDER — QUETIAPINE FUMARATE 25 MG PO TABS
12.5000 mg | ORAL_TABLET | Freq: Every day | ORAL | Status: DC
  Administered 2017-08-10 – 2017-08-14 (×5): 12.5 mg via ORAL
  Filled 2017-08-10 (×5): qty 1

## 2017-08-10 NOTE — Progress Notes (Signed)
Spoke at length with patient's sister Augustine RadarLou Land. Pt's sister expressed real concern  for the well-being of pt. She states," I need to be his advocate. If he goes back to living at that house with his son, he won't make it 5 days." She states, " He lives close to [facility], and I would like him to go there when he is discharged."  Pt's sister is tearful during this conversation. Provided emotional support to Pt's sister. Case management referral made as well as social work consult.  Delories HeinzMelissa Supreme Rybarczyk, RN

## 2017-08-10 NOTE — Progress Notes (Signed)
ANTICOAGULATION CONSULT NOTE - Follow Up Consult  Pharmacy Consult for Heparin  Indication: Severe CAD s/p cath  No Known Allergies  Patient Measurements: Height: 5\' 9"  (175.3 cm) Weight: 174 lb 9.7 oz (79.2 kg) IBW/kg (Calculated) : 70.7  Vital Signs: Temp: 97.7 F (36.5 C) (10/23 0329) Temp Source: Oral (10/23 0329) BP: 102/70 (10/23 0400) Pulse Rate: 91 (10/23 0400)  Labs:  Recent Labs  08/09/17 0246 08/09/17 0426 08/09/17 0720 08/09/17 1025 08/10/17 0411  HGB 15.1  --   --   --  13.0  HCT 44.5  --   --   --  39.3  PLT 213  --   --   --  193  APTT  --   --  28  --   --   LABPROT  --   --  13.5  --   --   INR  --   --  1.04  --   --   HEPARINUNFRC  --   --   --   --  0.16*  CREATININE 1.28*  --   --   --   --   TROPONINI  --  6.18* 15.25* 27.86*  --     Estimated Creatinine Clearance: 48.3 mL/min (A) (by C-G formula based on SCr of 1.28 mg/dL (H)).   Assessment: 76 y/o M on heparin for severe CAD, now s/p cath, TCTS recommending post-MI care/palliative care consult. Heparin level low this AM, no issues per RN.   Goal of Therapy:  Heparin level 0.3-0.7 units/ml Monitor platelets by anticoagulation protocol: Yes   Plan:  -Inc heparin to 1050 units/hr -1300 HL  Hoyle Barkdull 08/10/2017,5:00 AM

## 2017-08-10 NOTE — Progress Notes (Signed)
ANTICOAGULATION CONSULT NOTE - Follow Up Consult  Pharmacy Consult for Heparin  Indication: Severe CAD s/p cath  No Known Allergies  Patient Measurements: Height: 5\' 9"  (175.3 cm) Weight: 174 lb 9.7 oz (79.2 kg) IBW/kg (Calculated) : 70.7  Vital Signs: Temp: 98.3 F (36.8 C) (10/23 1200) Temp Source: Oral (10/23 1200) BP: 107/72 (10/23 1600) Pulse Rate: 116 (10/23 1600)  Labs:  Recent Labs  08/09/17 0246 08/09/17 0426 08/09/17 0720 08/09/17 1025 08/10/17 0411 08/10/17 1514  HGB 15.1  --   --   --  13.0  --   HCT 44.5  --   --   --  39.3  --   PLT 213  --   --   --  193  --   APTT  --   --  28  --   --   --   LABPROT  --   --  13.5  --   --   --   INR  --   --  1.04  --   --   --   HEPARINUNFRC  --   --   --   --  0.16* 0.20*  CREATININE 1.28*  --   --   --  1.66*  --   TROPONINI  --  6.18* 15.25* 27.86*  --   --     Estimated Creatinine Clearance: 37.3 mL/min (A) (by C-G formula based on SCr of 1.66 mg/dL (H)).   Assessment: 76 y/o M on heparin for severe CAD, now s/p cath, TCTS recommending post-MI care/palliative care consult.   Heparin level low at 0.2  Goal of Therapy:  Heparin level 0.3-0.7 units/ml Monitor platelets by anticoagulation protocol: Yes   Plan:  Bolus heparin 1000 x 1 Increase gtt to 1200 units/hr Next lvl 0000 Daily HL, CBC  Isaac BlissMichael Alisandra Son, PharmD, BCPS, BCCCP Clinical Pharmacist Clinical phone for 08/10/2017 from 7a-3:30p: [phone] If after 3:30p, please call main pharmacy at: [phone] 08/10/2017 4:15 PM

## 2017-08-10 NOTE — Progress Notes (Addendum)
[place] TEAM 1 - Stepdown/ICU TEAM  Jacob Barajas  ZOX:[id number] DOB: Feb 19, 1941 DOA: 08/09/2017 PCP: Patient, No Pcp Per    Brief Narrative:  76 year old male who lives with his son with a history of gout, dementia, and diastolic CHF who was brought in by EMS c/o nausea and vomiting.  He was found to be febrile and hypotensive at 80/40 w/ a HR of 130.  Saturations were 80% on room air.  He was placed on BiPAP and admitted to the intensive care unit.  He was discovered to have an elevated troponin.    Subjective: The patient is agitated/confused this morning.  Reportedly he was much more alert yesterday.  He has soiled the bed and is attempting to get out of the bed and is proving difficult to redirect.  He tells me he is trying to "get to his truck."  He cannot tell me where he is or why he is here.  There is no evidence of respiratory distress or uncontrolled pain.  He denies any complaints but his history is not felt to be reliable at this time.  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, and the pt is not a candidate for CABG per TCTS - lesions are too diffuse to be amenable to PTCA/stenting - medical management is our only option - Cards directing this portion of his care - he does not presently appear to be experiencing any chest pain\  Acute combined systolic and diastolic CHF - Cardiogenic shock - Lactic acidosis TTE noted EF 25% w/ grade 1 DD - BB being stopped today - trial of Isordil to be initiated per Cards - resume BB if BP improves w/ diuresis - ARB if renal fxn stabilizes   Acute respiratory failure with hypoxia and hypercapnia Due to pulmonary edema in setting of acute heart failure + Rhinovirus URI - diuresis and follow -has been liberated from BiPAP  Lactic Acidosis  Due to cardiogenic shock - stop broad-spectrum antibiotics - diuresing in attempts to improve CO  Acute kidney injury Due to cardiogenic shock v/s contrast nephropathy -  follow w/ diuresis   Recent Labs Lab 08/09/17 0246 08/10/17 0411  CREATININE 1.28* 1.66*    Acute delirium Likely sundowning w/ hx of baseline dementia - check B12, folic acid, and ammonia - QHS low dose scheduled seroquel   Nausea / vomiting and abdominal distention CT abdomen unrevealing - symptoms appear to have resolved - lipase normal  Gout Stable  DVT prophylaxis: SQ heparin  Code Status: FULL CODE Family Communication: no family present at time of exam  Disposition Plan:   Consultants:  Cards TCTS  Antimicrobials:  Zosyn 10/21 > 10/22 Vancomycin 10/21 > 10/22 Azithromycin 10/22  Objective: Blood pressure 97/70, pulse (!) 106, temperature 98.1 F (36.7 C), temperature source Oral, resp. rate (!) 26, height 5\' 9"  (1.753 m), weight 79.2 kg (174 lb 9.7 oz), SpO2 96 %.  Intake/Output Summary (Last 24 hours) at 08/10/17 0831 Last data filed at 08/10/17 8119  Gross per 24 hour  Intake          1683.08 ml  Output             1650 ml  Net            33.08 ml   Filed Weights   08/09/17 0234 08/10/17 0200  Weight: 72.6 kg (160 lb) 79.2 kg (174 lb 9.7 oz)    Examination: General: No acute respiratory  distress - agitated  Lungs: Clear to auscultation bilaterally without wheezes or crackles Cardiovascular: Regular rate and rhythm without murmur gallop or rub normal S1 and S2 Abdomen: Nontender, nondistended, soft, bowel sounds positive, no rebound, no ascites, no appreciable mass Extremities: No significant cyanosis, clubbing, or edema bilateral lower extremities  CBC:  Recent Labs Lab 08/09/17 0246 08/10/17 0411  WBC 18.1* 21.1*  NEUTROABS 13.7*  --   HGB 15.1 13.0  HCT 44.5 39.3  MCV 97.4 98.0  PLT 213 193   Basic Metabolic Panel:  Recent Labs Lab 08/09/17 0246 08/10/17 0411  NA 140 134*  K 3.8 4.3  CL 101 98*  CO2 25 27  GLUCOSE 169* 147*  BUN 17 23*  CREATININE 1.28* 1.66*  CALCIUM 8.8* 8.2*   GFR: Estimated Creatinine Clearance: 37.3  mL/min (A) (by C-G formula based on SCr of 1.66 mg/dL (H)).  Liver Function Tests:  Recent Labs Lab 08/09/17 0246 08/10/17 0411  AST 95* 207*  ALT 22 43  ALKPHOS 47 37*  BILITOT 0.6 1.2  PROT 7.7 6.6  ALBUMIN 3.9 3.4*    Recent Labs Lab 08/09/17 0720  LIPASE 35   Coagulation Profile:  Recent Labs Lab 08/09/17 0720  INR 1.04    Cardiac Enzymes:  Recent Labs Lab 08/09/17 0426 08/09/17 0720 08/09/17 1025  TROPONINI 6.18* 15.25* 27.86*    HbA1C: Hgb A1c MFr Bld  Date/Time Value Ref Range Status  08/09/2017 07:20 AM 5.2 4.8 - 5.6 % Final    Comment:    (NOTE) Pre diabetes:          5.7%-6.4% Diabetes:              >6.4% Glycemic control for   <7.0% adults with diabetes   03/28/2016 05:19 AM 5.7 (H) 4.8 - 5.6 % Final    Comment:    (NOTE)         Pre-diabetes: 5.7 - 6.4         Diabetes: >6.4         Glycemic control for adults with diabetes: <7.0      Recent Results (from the past 240 hour(s))  MRSA PCR Screening     Status: None   Collection Time: 08/09/17  6:39 AM  Result Value Ref Range Status   MRSA by PCR NEGATIVE NEGATIVE Final    Comment:        The GeneXpert MRSA Assay (FDA approved for NASAL specimens only), is one component of a comprehensive MRSA colonization surveillance program. It is not intended to diagnose MRSA infection nor to guide or monitor treatment for MRSA infections.   Urine culture     Status: None   Collection Time: 08/09/17  7:00 AM  Result Value Ref Range Status   Specimen Description URINE, CLEAN CATCH  Final   Special Requests NONE  Final   Culture   Final    NO GROWTH Performed at [facility], [address], [place], [place] [address]    Report Status 08/10/2017 FINAL  Final  Respiratory Panel by PCR     Status: Abnormal   Collection Time: 08/09/17  7:00 AM  Result Value Ref Range Status   Adenovirus NOT DETECTED NOT DETECTED Final   Coronavirus 229E NOT DETECTED NOT DETECTED Final    Coronavirus HKU1 NOT DETECTED NOT DETECTED Final   Coronavirus NL63 NOT DETECTED NOT DETECTED Final   Coronavirus OC43 NOT DETECTED NOT DETECTED Final   Metapneumovirus NOT DETECTED NOT DETECTED Final  Rhinovirus / Enterovirus DETECTED (A) NOT DETECTED Final   Influenza A NOT DETECTED NOT DETECTED Final   Influenza B NOT DETECTED NOT DETECTED Final   Parainfluenza Virus 1 NOT DETECTED NOT DETECTED Final   Parainfluenza Virus 2 NOT DETECTED NOT DETECTED Final   Parainfluenza Virus 3 NOT DETECTED NOT DETECTED Final   Parainfluenza Virus 4 NOT DETECTED NOT DETECTED Final   Respiratory Syncytial Virus NOT DETECTED NOT DETECTED Final   Bordetella pertussis NOT DETECTED NOT DETECTED Final   Chlamydophila pneumoniae NOT DETECTED NOT DETECTED Final   Mycoplasma pneumoniae NOT DETECTED NOT DETECTED Final    Comment: Performed at [facility], [address] [place], [place] [address]     Scheduled Meds: . aspirin  81 mg Oral Daily  . atorvastatin  40 mg Oral q1800  . carvedilol  3.125 mg Oral BID WC  . Influenza vac split quadrivalent PF  0.5 mL Intramuscular Tomorrow-1000  . iopamidol      . ipratropium  0.5 mg Nebulization Q6H  . levalbuterol  1.25 mg Nebulization Q6H  . pneumococcal 23 valent vaccine  0.5 mL Intramuscular Tomorrow-1000  . sodium chloride flush  3 mL Intravenous Q12H     LOS: 1 day   Lonia BloodJeffrey T. Ayeshia Coppin, MD [facility] Office  [phone] Pager - Text Page per Amion as per below:  On-Call/Text Page:      [email or web address]      password [email or web address]  If 7PM-7AM, please contact night-coverage [email or web address] Password [email or web address] 08/10/2017, 8:31 AM

## 2017-08-10 NOTE — Progress Notes (Signed)
Progress Note  Patient Name: Jacob Barajas Date of Encounter: 08/10/2017  Primary Cardiologist: Dr. Duke Salviaandolph (new)  Subjective   Denies chest pain or shortness of breath.  Says he wants to go home  Inpatient Medications    Scheduled Meds: . aspirin  81 mg Oral Daily  . atorvastatin  40 mg Oral q1800  . carvedilol  3.125 mg Oral BID WC  . Influenza vac split quadrivalent PF  0.5 mL Intramuscular Tomorrow-1000  . iopamidol      . ipratropium  0.5 mg Nebulization Q6H  . levalbuterol  1.25 mg Nebulization Q6H  . pneumococcal 23 valent vaccine  0.5 mL Intramuscular Tomorrow-1000  . sodium chloride flush  3 mL Intravenous Q12H   Continuous Infusions: . sodium chloride    . azithromycin Stopped (08/10/17 16100651)  . heparin 1,050 Units/hr (08/10/17 0809)  . piperacillin-tazobactam (ZOSYN)  IV Stopped (08/10/17 0455)  . vancomycin Stopped (08/10/17 0428)   PRN Meds: sodium chloride, acetaminophen, dextromethorphan-guaiFENesin, hydrALAZINE, morphine injection, nitroGLYCERIN, ondansetron (ZOFRAN) IV, sodium chloride flush, zolpidem   Vital Signs    Vitals:   08/10/17 0700 08/10/17 0743 08/10/17 0746 08/10/17 0806  BP: 93/65 (!) 88/63  97/70  Pulse: (!) 108 (!) 106 (!) 103 (!) 106  Resp: (!) 35  (!) 26 (!) 26  Temp:      TempSrc:      SpO2: 97%  94% 96%  Weight:      Height:        Intake/Output Summary (Last 24 hours) at 08/10/17 0811 Last data filed at 08/10/17 [id number]  Gross per 24 hour  Intake          1683.08 ml  Output             1650 ml  Net            33.08 ml   Filed Weights   08/09/17 0234 08/10/17 0200  Weight: 72.6 kg (160 lb) 79.2 kg (174 lb 9.7 oz)    Telemetry    Sinus tachycardia.  PVCs - Personally Reviewed  ECG    Sinus tachycardia.  Rate 105.  Prior septal infarct.  Anterolateral ST/T- Personally Reviewed  Physical Exam   VS:  BP 97/70 (BP Location: Left Arm)   Pulse (!) 106   Temp 98.1 F (36.7 C) (Oral)   Resp (!) 26   Ht 5\' 9"  (1.753  m)   Wt 79.2 kg (174 lb 9.7 oz)   SpO2 96%   BMI 25.78 kg/m  , BMI Body mass index is 25.78 kg/m. GENERAL:  Confused and agitated.   HEENT: Pupils equal round and reactive, fundi not visualized, oral mucosa unremarkable NECK:  No jugular venous distention, waveform within normal limits, carotid upstroke brisk and symmetric, no bruits, no thyromegaly LUNGS:  Diffuse expiratory wheezes.  No crackles.  HEART:  Tachycardic.  Regular rhythm.  PMI not displaced or sustained,S1 and S2 within normal limits, no S3, no S4, no clicks, no rubs, no murmurs ABD:  Flat, positive bowel sounds normal in frequency in pitch, no bruits, no rebound, no guarding, no midline pulsatile mass, no hepatomegaly, no splenomegaly EXT:  2 plus pulses throughout, no edema, no cyanosis no clubbing SKIN:  No rashes no nodules NEURO:  Cranial nerves II through XII grossly intact, motor grossly intact throughout [facility]:  Cognitively intact, oriented to person place and time  Labs    Chemistry Recent Labs Lab 08/09/17 0246 08/10/17 0411  NA 140 134*  K  3.8 4.3  CL 101 98*  CO2 25 27  GLUCOSE 169* 147*  BUN 17 23*  CREATININE 1.28* 1.66*  CALCIUM 8.8* 8.2*  PROT 7.7 6.6  ALBUMIN 3.9 3.4*  AST 95* 207*  ALT 22 43  ALKPHOS 47 37*  BILITOT 0.6 1.2  GFRNONAA 52* 38*  GFRAA >60 44*  ANIONGAP 14 9     Hematology Recent Labs Lab 08/09/17 0246 08/10/17 0411  WBC 18.1* 21.1*  RBC 4.57 4.01*  HGB 15.1 13.0  HCT 44.5 39.3  MCV 97.4 98.0  MCH 33.0 32.4  MCHC 33.9 33.1  RDW 13.8 14.1  PLT 213 193    Cardiac Enzymes Recent Labs Lab 08/09/17 0426 08/09/17 0720 08/09/17 1025  TROPONINI 6.18* 15.25* 27.86*    Recent Labs Lab 08/09/17 0259  TROPIPOC 6.80*     BNP Recent Labs Lab 08/09/17 0448  BNP 345.2*     DDimer No results for input(s): DDIMER in the last 168 hours.   Radiology    Ct Abdomen Pelvis Wo Contrast  Result Date: 08/10/2017 CLINICAL DATA:  76 y/o M; nausea, vomiting, and  abdominal distention. EXAM: CT ABDOMEN AND PELVIS WITHOUT CONTRAST TECHNIQUE: Multidetector CT imaging of the abdomen and pelvis was performed following the standard protocol without IV contrast. COMPARISON:  None. FINDINGS: Lower chest: Small bilateral pleural effusions. Severe coronary artery calcification. Mild diffuse peribronchial thickening. Hepatobiliary: Nonspecific punctate calcification in right lobe of liver likely representing sequelae of granulomatous disease. No other focal liver abnormality is seen. No gallstones, gallbladder wall thickening, or biliary dilatation. Pancreas: Unremarkable. No pancreatic ductal dilatation or surrounding inflammatory changes. Spleen: Normal in size without focal abnormality. Adrenals/Urinary Tract: Adrenal glands are unremarkable. Kidneys are normal, without renal calculi, focal lesion, or hydronephrosis. Bladder is unremarkable. Stomach/Bowel: Stomach is within normal limits. Appendix not identified, no pericecal inflammatory changes. No evidence of bowel wall thickening, distention, or inflammatory changes. No acute process identified. Vascular/Lymphatic: Aortic atherosclerosis. No enlarged abdominal or pelvic lymph nodes. Reproductive: Prostate is unremarkable. Other: No abdominal wall hernia or abnormality. No abdominopelvic ascites. Musculoskeletal: Advanced multilevel degenerative changes of the lumbar spine with multiple levels of canal stenosis greatest at the L4-5 level where it is moderate to severe. No acute fracture identified. IMPRESSION: 1. No acute abdominal or pelvic process identified. 2. Small bilateral pleural effusions. Bronchitic changes in the lung bases. 3. Severe coronary artery and aortic calcific atherosclerosis. 4. Advanced lumbar degenerative changes with multilevel canal stenosis. Electronically Signed   By: Mitzi Hansen M.D.   On: 08/10/2017 03:33   Dg Chest Port 1 View  Result Date: 08/09/2017 CLINICAL DATA:  Respiratory  failure, sepsis, CHF and myocardial infarction. EXAM: PORTABLE CHEST 1 VIEW COMPARISON:  Prior study at 0230 hours FINDINGS: The heart size and mediastinal contours are within normal limits. Lungs show persistent mild bronchial and interstitial prominence bilaterally. There may be a component of mild interstitial edema as well as acute bronchitis. No significant focal airspace consolidation or pleural effusions are identified. No evidence of pneumothorax. The visualized skeletal structures are unremarkable. IMPRESSION: Stable mild bronchial and interstitial prominence with potential component of mild interstitial edema as well as acute bronchitis. Electronically Signed   By: Irish Lack M.D.   On: 08/09/2017 08:25   Dg Chest Port 1 View  Result Date: 08/09/2017 CLINICAL DATA:  Fever and cough, ex-smoker. EXAM: PORTABLE CHEST 1 VIEW COMPARISON:  05/04/2016 FINDINGS: The heart size and mediastinal contours are within normal limits. Nonspecific mild diffuse interstitial prominence is  noted which may represent mild interstitial edema or bronchitic change some considerations. No significant effusion or pneumothorax. No confluent airspace disease. No acute nor suspicious osseous abnormality. IMPRESSION: Nonspecific mild diffuse interstitial prominence with differential possibilities that may include bronchitic change or mild interstitial edema. Given history of fever and cough, favor bronchitic change. Electronically Signed   By: Tollie Eth M.D.   On: 08/09/2017 03:14    Cardiac Studies   Echo 08/09/17: Study Conclusions  - Left ventricle: The cavity size was normal. Wall thickness was   increased in a pattern of mild LVH. Mid to apical anteroseptal   akinesis. Akinesis of the true apex. Inferoseptal, inferior,   anterolateral, and anterior severe hypokinesis. The estimated   ejection fraction was 25%. Doppler parameters are consistent with   abnormal left ventricular relaxation (grade 1  diastolic   dysfunction). - Aortic valve: Trileaflet; moderately calcified leaflets.   Sclerosis without stenosis. - Mitral valve: There was moderate regurgitation. - Left atrium: The atrium was mildly dilated. - Right ventricle: The cavity size was normal. Systolic function   was mildly reduced. - Pulmonary arteries: No complete TR doppler jet so unable to   estimate PA systolic pressure. - Systemic veins: The IVC measured 2.1 cm with > 50% respirophasic   variation, suggesting RA pressure 8 mmHg.  LHC 08/09/17:  Prox RCA to Mid RCA lesion, 100 %stenosed.  Mid RCA to Dist RCA lesion, 95 %stenosed.  Dist RCA lesion, 95 %stenosed.  Ost LM lesion, 95 %stenosed.  Prox LAD lesion, 80 %stenosed.  Ost 1st Diag to 1st Diag lesion, 80 %stenosed.  Mid LAD lesion, 100 %stenosed.  Ost Cx lesion, 95 %stenosed.  Ost Cx to Prox Cx lesion, 80 %stenosed.  LM lesion, 95 %stenosed.  Patient Profile     Mr. Galik is a 64M with chronic diastolic heart failure and dementia (mild) here with sepsis and NSTEMI.  He was found to have new onset systolic dysfunction and 3 vessel CAD.  Assessment & Plan    # NSTEMI: # 3 vessel CAD: # Hyperlipidemia: Troponin elevated to 48.  Revealed severe, three-vessel chronically occluded RCA, 95% ostial left main, 100% mid LAD and 95% ostial LCx.  Ideally he would have CABG but is not a candidate due to frailty, dementia and sepsis.  Plan for medical management and palliative care.  Fortunately he has no angina. Continue aspirin and atorvastatin.  Hold carvedilol 2/2 hypotension.    # Acute systolic and diastolic heart failure:   # Cardiogenic shock: # Moderate MR: LVEDP was elevated at 21 mmHg yesterday.  Blood pressure has been running low.  We will stop carvedilol and start with afterload reduction.  Start isordil 10 mg tid.  He received one dose of IV lasix with good urinary response.  Will start lasix 40mg  po daily.  Renal function is elevated today,  though this may also be 2/2 contrast dye.  Will restart beta blocker and add ARB as BP improves.   Time spent: 45 minutes-Greater than 50% of this time was spent in counseling, explanation of diagnosis, planning of further management, and coordination of care.   For questions or updates, please contact [facility] Please consult [email or web address] for contact info under Cardiology/STEMI.      Signed, Chilton Si, MD  08/10/2017, 8:11 AM

## 2017-08-10 NOTE — Progress Notes (Signed)
Patient is currently on [facility] with sats of 94%. All vitals are stable and patient is resting comfortably. BIPAP is not needed at this time. Will continue to monitor.

## 2017-08-11 DIAGNOSIS — I5032 Chronic diastolic (congestive) heart failure: Secondary | ICD-10-CM

## 2017-08-11 DIAGNOSIS — B348 Other viral infections of unspecified site: Secondary | ICD-10-CM

## 2017-08-11 DIAGNOSIS — I5043 Acute on chronic combined systolic (congestive) and diastolic (congestive) heart failure: Secondary | ICD-10-CM

## 2017-08-11 DIAGNOSIS — J129 Viral pneumonia, unspecified: Secondary | ICD-10-CM

## 2017-08-11 DIAGNOSIS — R410 Disorientation, unspecified: Secondary | ICD-10-CM

## 2017-08-11 LAB — COMPREHENSIVE METABOLIC PANEL
ALT: 39 U/L (ref 17–63)
AST: 139 U/L — AB (ref 15–41)
Albumin: 3.4 g/dL — ABNORMAL LOW (ref 3.5–5.0)
Alkaline Phosphatase: 34 U/L — ABNORMAL LOW (ref 38–126)
Anion gap: 12 (ref 5–15)
BILIRUBIN TOTAL: 1.4 mg/dL — AB (ref 0.3–1.2)
BUN: 23 mg/dL — AB (ref 6–20)
CHLORIDE: 98 mmol/L — AB (ref 101–111)
CO2: 25 mmol/L (ref 22–32)
CREATININE: 1.28 mg/dL — AB (ref 0.61–1.24)
Calcium: 8.3 mg/dL — ABNORMAL LOW (ref 8.9–10.3)
GFR calc Af Amer: >60 mL/min (ref >60)
GFR, EST NON AFRICAN AMERICAN: 52 mL/min — AB (ref >60)
Glucose, Bld: 107 mg/dL — ABNORMAL HIGH (ref 65–99)
Potassium: 3.9 mmol/L (ref 3.5–5.1)
Sodium: 135 mmol/L (ref 135–145)
Total Protein: 6.7 g/dL (ref 6.5–8.1)

## 2017-08-11 LAB — IRON AND TIBC
Iron: 27 ug/dL — ABNORMAL LOW (ref 45–182)
SATURATION RATIOS: 14 % — AB (ref 17.9–39.5)
TIBC: 193 ug/dL — ABNORMAL LOW (ref 250–450)
UIBC: 166 ug/dL

## 2017-08-11 LAB — CBC
HEMATOCRIT: 35.4 % — AB (ref 39.0–52.0)
HEMOGLOBIN: 11.7 g/dL — AB (ref 13.0–17.0)
MCH: 32.1 pg (ref 26.0–34.0)
MCHC: 33.1 g/dL (ref 30.0–36.0)
MCV: 97.3 fL (ref 78.0–100.0)
Platelets: 167 10*3/uL (ref 150–400)
RBC: 3.64 MIL/uL — AB (ref 4.22–5.81)
RDW: 14 % (ref 11.5–15.5)
WBC: 15.3 10*3/uL — ABNORMAL HIGH (ref 4.0–10.5)

## 2017-08-11 LAB — RETICULOCYTES
RBC.: 3.78 MIL/uL — AB (ref 4.22–5.81)
Retic Count, Absolute: 52.9 10*3/uL (ref 19.0–186.0)
Retic Ct Pct: 1.4 % (ref 0.4–3.1)

## 2017-08-11 LAB — FOLATE
FOLATE: 14.2 ng/mL (ref >5.9)
Folate: 12.2 ng/mL (ref >5.9)

## 2017-08-11 LAB — HEPARIN LEVEL (UNFRACTIONATED)
HEPARIN UNFRACTIONATED: 0.38 [IU]/mL (ref 0.30–0.70)
Heparin Unfractionated: 0.33 IU/mL (ref 0.30–0.70)

## 2017-08-11 LAB — AMMONIA: Ammonia: 38 umol/L — ABNORMAL HIGH (ref 9–35)

## 2017-08-11 LAB — FERRITIN: Ferritin: 325 ng/mL (ref 24–336)

## 2017-08-11 LAB — VITAMIN B12
VITAMIN B 12: 302 pg/mL (ref 180–914)
Vitamin B-12: 332 pg/mL (ref 180–914)

## 2017-08-11 MED ORDER — LEVALBUTEROL HCL 1.25 MG/0.5ML IN NEBU
1.2500 mg | INHALATION_SOLUTION | Freq: Three times a day (TID) | RESPIRATORY_TRACT | Status: DC
  Administered 2017-08-11 – 2017-08-15 (×13): 1.25 mg via RESPIRATORY_TRACT
  Filled 2017-08-11 (×14): qty 0.5

## 2017-08-11 MED ORDER — CLOPIDOGREL BISULFATE 75 MG PO TABS
75.0000 mg | ORAL_TABLET | Freq: Every day | ORAL | Status: DC
  Administered 2017-08-11 – 2017-08-15 (×5): 75 mg via ORAL
  Filled 2017-08-11 (×5): qty 1

## 2017-08-11 MED ORDER — HEPARIN SODIUM (PORCINE) 5000 UNIT/ML IJ SOLN
5000.0000 [IU] | Freq: Three times a day (TID) | INTRAMUSCULAR | Status: DC
  Administered 2017-08-11 – 2017-08-15 (×10): 5000 [IU] via SUBCUTANEOUS
  Filled 2017-08-11 (×9): qty 1

## 2017-08-11 MED ORDER — ISOSORBIDE MONONITRATE ER 30 MG PO TB24
30.0000 mg | ORAL_TABLET | Freq: Every day | ORAL | Status: DC
  Administered 2017-08-11 – 2017-08-15 (×5): 30 mg via ORAL
  Filled 2017-08-11 (×5): qty 1

## 2017-08-11 MED ORDER — GI COCKTAIL ~~LOC~~
30.0000 mL | Freq: Three times a day (TID) | ORAL | Status: DC | PRN
  Administered 2017-08-11: 30 mL via ORAL
  Filled 2017-08-11: qty 30

## 2017-08-11 MED ORDER — METOPROLOL TARTRATE 12.5 MG HALF TABLET
12.5000 mg | ORAL_TABLET | Freq: Two times a day (BID) | ORAL | Status: DC
  Administered 2017-08-11 (×2): 12.5 mg via ORAL
  Filled 2017-08-11 (×2): qty 1

## 2017-08-11 MED ORDER — LEVALBUTEROL HCL 1.25 MG/0.5ML IN NEBU: 1.2500 mg | INHALATION_SOLUTION | Freq: Four times a day (QID) | RESPIRATORY_TRACT | Status: DC

## 2017-08-11 MED ORDER — METHYLPREDNISOLONE SODIUM SUCC 125 MG IJ SOLR
60.0000 mg | INTRAMUSCULAR | Status: DC
  Administered 2017-08-11 – 2017-08-12 (×2): 60 mg via INTRAVENOUS
  Filled 2017-08-11 (×2): qty 2

## 2017-08-11 NOTE — Progress Notes (Signed)
ANTICOAGULATION CONSULT NOTE - Follow Up Consult  Pharmacy Consult for Heparin  Indication: Severe CAD s/p cath  No Known Allergies  Patient Measurements: Height: 5\' 9"  (175.3 cm) Weight: 174 lb 9.7 oz (79.2 kg) IBW/kg (Calculated) : 70.7  Vital Signs: Temp: 98.7 F (37.1 C) (10/23 2300) Temp Source: Oral (10/23 2300) BP: 101/77 (10/24 0000) Pulse Rate: 107 (10/24 0000)  Labs:  Recent Labs  08/09/17 0246 08/09/17 0426 08/09/17 0720 08/09/17 1025 08/10/17 0411 08/10/17 1514 08/11/17 0039  HGB 15.1  --   --   --  13.0  --   --   HCT 44.5  --   --   --  39.3  --   --   PLT 213  --   --   --  193  --   --   APTT  --   --  28  --   --   --   --   LABPROT  --   --  13.5  --   --   --   --   INR  --   --  1.04  --   --   --   --   HEPARINUNFRC  --   --   --   --  0.16* 0.20* 0.33  CREATININE 1.28*  --   --   --  1.66*  --   --   TROPONINI  --  6.18* 15.25* 27.86*  --   --   --     Estimated Creatinine Clearance: 37.3 mL/min (A) (by C-G formula based on SCr of 1.66 mg/dL (H)).   Assessment: 76 y/o M on heparin for severe CAD, now s/p cath, TCTS recommending post-MI care/palliative care consult. Heparin level therapeutic x 1 tonight  Goal of Therapy:  Heparin level 0.3-0.7 units/ml Monitor platelets by anticoagulation protocol: Yes   Plan:  -Cont heparin at 1200 units/hr -Heparin level with AM labs  Abran DukeLedford, Acie Custis 08/11/2017,1:21 AM

## 2017-08-11 NOTE — Progress Notes (Signed)
Palliative:  I have left a message with Mr. Blase MessKinley's sister Augustine RadarLou Land at [phone] with his permission. I have requested to meet together with Ms. Hurley CiscoLand, [name], and Mr. Blase MessKinley's son tomorrow morning if possible. Will await return call. Thank you for this consult.   No charge  Yong ChannelAlicia Tatelyn Vanhecke, NP Palliative Medicine Team Pager # [phone] (M-F 8a-5p) Team Phone # [phone] (Nights/Weekends)

## 2017-08-11 NOTE — Progress Notes (Signed)
PROGRESS NOTE    Jacob Barajas  WUJ:[id number] DOB: 06/23/1941 DOA: 08/09/2017 PCP: Patient, No Pcp Per   Brief Narrative:  76 y.o. WM PMHx Gout, Dementia, Chronic Diastolic CHF (by 2d echo on 02/04/16, who shortness breath, Hypotension and fever.   Patient has dementia, and is unable to provide accurate medical history, therefore, most of the history is obtained by discussing the case with ED physician, per EMS report, and with the nursing staff. The history is limited.    Per EMS report, they were called out secondary to patient nausea vomiting and vomiting. Patient was found to have fever of 11.8, hypotension with blood pressure 80/40, tachycardia with heart rate up to 130, all seemed desaturation to 80% on room air. When I saw pt in ED, he is alert. He knows that he is in hospital and can tell his own name, but not oriented to time. He has dry cough when he talks to me. He denies abdominal pain or diarrhea. Denies symptoms of UTI. He moves all normally.   After received approximately 2 L normal saline bolus in ED, his respiratory distress has suddenly worsened. His respiratory rate is up to 40s, oxygen desaturated to 80s. He has diffuse rhonchi and crackles on auscultation. 40 mg of Lasix was ordered by me for suspected fluid overload. BiPAP was started. Patient's oxygen saturation is back to 97% and respiratory rate decreased to 20s with BiPAP. Stat ABG showed pH of 7.238, PCO2 49.9, PO2 42.   ED Course: pt was found to have SBP 110-120, tachycardia with heart rate 120-130, tachypnea, temperature 99.2, WBC 18.1, lactic acid is 3.63, acute renal injury with creatinine 1.28, troponin 6.8, chest x-ray showed possible bronchitic change and vascular cephalization. Pt is admited to stepdown as inpatient. Cardiology, Dr. Cristal Deer was consulted by EDP.    Subjective: 10/24 A/O 4 negative CP, negative SOB, negative abdominal pain negative N/V.     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 Cardiology directing this portion of his care. Currently asymptomatic  Acute combined systolic and diastolic CHF - Cardiogenic shock - Lactic acidosis TTE noted EF 25% w/ grade 1 DD - BB being stopped today - trial of Isordil to be initiated per Cards - resume BB if BP improves w/ diuresis - ARB if renal fxn stabilizes  -Strict in and out -Daily weight -Transfuse for hemoglobin<8 -10/24 PT/OT consult placed multivessel cardiac disease, S/P NSTEMI evaluate for CIR vs SNF vs outpatient cardiac rehabilitation   Acute respiratory failure with hypoxia and hypercapnia/positive Rhinovirus CAP -Titrate O2 to maintain SPO2> 93% -Xopenex QID next line -Flutter valve -Mucinex -Solu-Medrol 60 mg daily  Lactic Acidosis  -Due to cardiogenic shock -patient on broad-spectrum antibiotics. Monitor for signs of infection. MAXIMUM TEMPERATURE last 24 hours afebrile,leukocytosis resolving (most likely reactive).   Acute renal failure Due to cardiogenic shock v/s contrast nephropathy - follow w/ diuresis  Recent Labs Lab 08/09/17 0246 08/10/17 0411 08/11/17 0443  CREATININE 1.28* 1.66* 1.28*  -slight improvement continue to monitor  Acute delirium -Currently A/O 4.although sister states patient has episodes of decreased cognition sundowning's vs baseline dementia? -B-12, folic acid, ammonia pending   Nausea / vomiting  and abdominal distention -CT abdomen unrevealing - symptoms appear to have resolved - lipase normal  Anemia -Anemia panel pending -Occult blood pending   Gout Stable    DVT prophylaxis: subcutaneous heparin Code Status: full Family Communication: sister at bedside for discussion of plan of care Disposition Plan: per cardiology   Consultants:  Cardiology Cardiothoracic surgery    Procedures/Significant Events:  10/22 Echocardiogram:LVEF= Mid to apical anteroseptal  akinesis. Akinesis of the true apex. Inferoseptal, inferior,  anterolateral, and anterior severe hypokinesis. -LVEF=25%.  -Grade 1 diastolic dysfunction. - Mitral valve: moderate regurgitation.  10/22 LEFT heart catheterization:-left main/three-vessel disease with an occluded dominant RCA with bridging collaterals and high-grade calcified left main trifurcation disease with proximal LAD and circumflex disease as well as diagonal branch disease and short segment occlusion of the mid LAD. -Severe LV dysfunction.  -Given his age and dementia as well as moderate renal insufficiency he is not a good candidate for bypass grafting nor is he a good candidate for high-risk PCI.  10/23 CT abdomen pelvis WO contrast:-No acute abdominal or pelvic process identified. -Small bilateral pleural effusions. -Bronchitic changes in the lung bases. - Advanced lumbar degenerative changes with multilevel canal stenosis.   I have personally reviewed and interpreted all radiology studies and my findings are as above.  VENTILATOR SETTINGS: none   Cultures none  Antimicrobials: Anti-infectives    Start     Stop   08/09/17 1600  vancomycin (VANCOCIN) IVPB 750 mg/150 ml premix  Status:  Discontinued     08/10/17 0901   08/09/17 1000  piperacillin-tazobactam (ZOSYN) IVPB 3.375 g  Status:  Discontinued     08/10/17 0901   08/09/17 0600  azithromycin (ZITHROMAX) 500 mg in dextrose 5 % 250 mL IVPB  Status:  Discontinued     08/10/17 0901    08/09/17 0230  piperacillin-tazobactam (ZOSYN) IVPB 3.375 g     08/09/17 0402   08/09/17 0230  vancomycin (VANCOCIN) IVPB 1000 mg/200 mL premix     08/09/17 0512       Devices    LINES / TUBES:      Continuous Infusions: . sodium chloride 10 mL/hr at 08/10/17 0919  . heparin 1,200 Units/hr (08/10/17 1804)     Objective: Vitals:   08/11/17 0400 08/11/17 0500 08/11/17 0600 08/11/17 0700  BP: 91/62 103/72 104/77 112/79  Pulse: (!) 106 (!) 107 (!) 105 (!) 105  Resp: (!) 24 (!) 24 (!) 24 (!) 22  Temp:      TempSrc:      SpO2: 92% 95% 98% 94%  Weight:  173 lb 8 oz (78.7 kg)    Height:        Intake/Output Summary (Last 24 hours) at 08/11/17 0820 Last data filed at 08/11/17 8657  Gross per 24 hour  Intake          1226.86 ml  Output             1650 ml  Net          -423.14 ml   Filed Weights   08/09/17 0234 08/10/17 0200 08/11/17 0500  Weight: 160 lb (72.6 kg) 174 lb 9.7 oz (79.2 kg) 173 lb 8 oz (78.7 kg)  Examination:  General: A/O 4, No acute respiratory distress Eyes: negative scleral hemorrhage, negative anisocoria, negative icterus ENT: Negative Runny nose, negative gingival bleeding, Neck:  Negative scars, masses, torticollis, lymphadenopathy, JVD Lungs: positive diffuse expiratory wheeze, mild bibasilar crackles.  Cardiovascular: Tachycardia, Regular rhythm without murmur gallop or rub normal S1 and S2 Abdomen: negative abdominal pain, nondistended, positive soft, bowel sounds, no rebound, no ascites, no appreciable mass Extremities: No significant cyanosis, clubbing. +1 bilateral pedal edema Skin: Negative rashes, lesions, ulcers Psychiatric:  Negative depression, negative anxiety, negative fatigue, negative mania  Central nervous system:  Cranial nerves II through XII intact, tongue/uvula midline, all extremities muscle strength 5/5, sensation intact throughout, negative dysarthria, negative expressive aphasia, negative receptive aphasia.  .      Data Reviewed: Care during the described time interval was provided by me .  I have reviewed this patient's available data, including medical history, events of note, physical examination, and all test results as part of my evaluation.   CBC:  Recent Labs Lab 08/09/17 0246 08/10/17 0411 08/11/17 0443  WBC 18.1* 21.1* 15.3*  NEUTROABS 13.7*  --   --   HGB 15.1 13.0 11.7*  HCT 44.5 39.3 35.4*  MCV 97.4 98.0 97.3  PLT 213 193 167   Basic Metabolic Panel:  Recent Labs Lab 08/09/17 0246 08/10/17 0411 08/11/17 0443  NA 140 134* 135  K 3.8 4.3 3.9  CL 101 98* 98*  CO2 25 27 25   GLUCOSE 169* 147* 107*  BUN 17 23* 23*  CREATININE 1.28* 1.66* 1.28*  CALCIUM 8.8* 8.2* 8.3*   GFR: Estimated Creatinine Clearance: 48.3 mL/min (A) (by C-G formula based on SCr of 1.28 mg/dL (H)). Liver Function Tests:  Recent Labs Lab 08/09/17 0246 08/10/17 0411 08/11/17 0443  AST 95* 207* 139*  ALT 22 43 39  ALKPHOS 47 37* 34*  BILITOT 0.6 1.2 1.4*  PROT 7.7 6.6 6.7  ALBUMIN 3.9 3.4* 3.4*    Recent Labs Lab 08/09/17 0720  LIPASE 35    Recent Labs Lab 08/11/17 0443  AMMONIA 38*   Coagulation Profile:  Recent Labs Lab 08/09/17 0720  INR 1.04   Cardiac Enzymes:  Recent Labs Lab 08/09/17 0426 08/09/17 0720 08/09/17 1025  TROPONINI 6.18* 15.25* 27.86*   BNP (last 3 results) No results for input(s): PROBNP in the last 8760 hours. HbA1C:  Recent Labs  08/09/17 0720  HGBA1C 5.2   CBG: No results for input(s): GLUCAP in the last 168 hours. Lipid Profile:  Recent Labs  08/09/17 0720  CHOL 146  HDL 41  LDLCALC 80  TRIG 125  CHOLHDL 3.6   Thyroid Function Tests: No results for input(s): TSH, T4TOTAL, FREET4, T3FREE, THYROIDAB in the last 72 hours. Anemia Panel:  Recent Labs  08/11/17 0443  VITAMINB12 302  FOLATE 12.2   Urine analysis:    Component Value Date/Time   COLORURINE YELLOW 08/09/2017 0700   APPEARANCEUR CLEAR 08/09/2017 0700    LABSPEC 1.013 08/09/2017 0700   PHURINE 5.0 08/09/2017 0700   GLUCOSEU NEGATIVE 08/09/2017 0700   HGBUR SMALL (A) 08/09/2017 0700   BILIRUBINUR NEGATIVE 08/09/2017 0700   KETONESUR NEGATIVE 08/09/2017 0700   PROTEINUR NEGATIVE 08/09/2017 0700   NITRITE NEGATIVE 08/09/2017 0700   LEUKOCYTESUR NEGATIVE 08/09/2017 0700   Sepsis Labs: @LABRCNTIP (procalcitonin:4,lacticidven:4)  ) Recent Results (from the past 240 hour(s))  Blood Culture (routine x 2)     Status: None (Preliminary result)   Collection Time: 08/09/17  2:34 AM  Result Value Ref Range  Status   Specimen Description RIGHT ANTECUBITAL  Final   Special Requests   Final    BOTTLES DRAWN AEROBIC AND ANAEROBIC Blood Culture adequate volume   Culture   Final    NO GROWTH 1 DAY Performed at [facility], [address] [place], [place] [address]    Report Status PENDING  Incomplete  Blood Culture (routine x 2)     Status: None (Preliminary result)   Collection Time: 08/09/17  3:10 AM  Result Value Ref Range Status   Specimen Description BLOOD RIGHT FOREARM  Final   Special Requests BOTTLES DRAWN AEROBIC AND ANAEROBIC BCAV  Final   Culture   Final    NO GROWTH 1 DAY Performed at [facility], [address], [place], [place] [address]    Report Status PENDING  Incomplete  MRSA PCR Screening     Status: None   Collection Time: 08/09/17  6:39 AM  Result Value Ref Range Status   MRSA by PCR NEGATIVE NEGATIVE Final    Comment:        The GeneXpert MRSA Assay (FDA approved for NASAL specimens only), is one component of a comprehensive MRSA colonization surveillance program. It is not intended to diagnose MRSA infection nor to guide or monitor treatment for MRSA infections.   Urine culture     Status: None   Collection Time: 08/09/17  7:00 AM  Result Value Ref Range Status   Specimen Description URINE, CLEAN CATCH  Final   Special Requests NONE  Final   Culture   Final    NO GROWTH Performed at [facility], [address] [place], [place] [address]    Report Status 08/10/2017 FINAL  Final  Respiratory Panel by PCR     Status: Abnormal   Collection Time: 08/09/17  7:00 AM  Result Value Ref Range Status   Adenovirus NOT DETECTED NOT DETECTED Final   Coronavirus 229E NOT DETECTED NOT DETECTED Final   Coronavirus HKU1 NOT DETECTED NOT DETECTED Final   Coronavirus NL63 NOT DETECTED NOT DETECTED Final   Coronavirus OC43 NOT DETECTED NOT DETECTED Final   Metapneumovirus NOT DETECTED NOT DETECTED Final   Rhinovirus / Enterovirus DETECTED (A) NOT DETECTED Final   Influenza A NOT DETECTED NOT DETECTED Final   Influenza B NOT DETECTED NOT DETECTED Final   Parainfluenza Virus 1 NOT DETECTED NOT DETECTED Final   Parainfluenza Virus 2 NOT DETECTED NOT DETECTED Final   Parainfluenza Virus 3 NOT DETECTED NOT DETECTED Final   Parainfluenza Virus 4 NOT DETECTED NOT DETECTED Final   Respiratory Syncytial Virus NOT DETECTED NOT DETECTED Final   Bordetella pertussis NOT DETECTED NOT DETECTED Final   Chlamydophila pneumoniae NOT DETECTED NOT DETECTED Final   Mycoplasma pneumoniae NOT DETECTED NOT DETECTED Final    Comment: Performed at [facility], [address], [place], [place] [address]         Radiology Studies: Ct Abdomen Pelvis Wo Contrast  Result Date: 08/10/2017 CLINICAL DATA:  76 y/o M; nausea, vomiting, and abdominal distention. EXAM: CT ABDOMEN AND PELVIS WITHOUT CONTRAST TECHNIQUE: Multidetector CT imaging of the abdomen and pelvis was performed following the standard protocol without IV contrast. COMPARISON:  None. FINDINGS: Lower chest: Small bilateral pleural effusions. Severe coronary artery calcification. Mild diffuse peribronchial thickening. Hepatobiliary: Nonspecific punctate calcification in right lobe of liver likely representing sequelae of granulomatous disease. No other focal liver abnormality is seen. No gallstones, gallbladder wall thickening, or biliary  dilatation. Pancreas: Unremarkable. No pancreatic ductal dilatation or surrounding inflammatory changes. Spleen: Normal in size without focal abnormality. Adrenals/Urinary Tract: Adrenal glands are unremarkable. Kidneys are normal, without renal calculi, focal lesion, or hydronephrosis. Bladder is unremarkable. Stomach/Bowel: Stomach is within normal limits. Appendix not identified, no pericecal inflammatory changes. No evidence of bowel wall thickening, distention, or inflammatory changes. No acute process identified. Vascular/Lymphatic: Aortic atherosclerosis. No enlarged abdominal or pelvic lymph nodes. Reproductive: Prostate is unremarkable. Other: No abdominal wall hernia or abnormality. No abdominopelvic ascites. Musculoskeletal: Advanced multilevel degenerative changes of the lumbar spine with multiple levels of canal stenosis greatest at the L4-5 level where it is moderate to severe. No acute fracture identified. IMPRESSION: 1. No acute abdominal or pelvic process identified. 2. Small bilateral pleural effusions. Bronchitic changes in the lung bases. 3. Severe coronary artery and aortic calcific atherosclerosis. 4. Advanced lumbar degenerative changes with multilevel canal stenosis. Electronically Signed   By: Mitzi HansenLance  Furusawa-Stratton M.D.   On: 08/10/2017 03:33        Scheduled Meds: . aspirin  81 mg Oral Daily  . atorvastatin  40 mg Oral q1800  . furosemide  40 mg Oral Daily  . Influenza vac split quadrivalent PF  0.5 mL Intramuscular Tomorrow-1000  . isosorbide mononitrate  30 mg Oral Daily  . metoprolol tartrate  12.5 mg Oral BID  . pneumococcal 23 valent vaccine  0.5 mL Intramuscular Tomorrow-1000  . QUEtiapine  12.5 mg Oral QHS  . sodium chloride flush  3 mL Intravenous Q12H   Continuous Infusions: . sodium chloride 10 mL/hr at 08/10/17 0919  . heparin 1,200 Units/hr (08/10/17 1804)     LOS: 2 days    Time spent: 40 minutes    Christell Steinmiller, Roselind MessierURTIS J, MD [facility] Pager  [phone]   If 7PM-7AM, please contact night-coverage [email or web address] Password [email or web address] 08/11/2017, 8:20 AM

## 2017-08-11 NOTE — Care Management Note (Addendum)
Case Management Note  Patient Details  Name: Milagros LollJohn Osias MRN: [id number] Date of Birth: 01/05/1941  Subjective/Objective:  From home with son  Who works (self employed), patient is w/chair bound at home,  Presents with NSTEMI, Sepsis, he has dementia, he is not a candidate for CABG per MD note, he states he has had HH before but does not remember the name of the agency.  NCM informed RN he will benefit from pt eval.   He states he has a PCP ,can not remember name .   Pt eval rec SNF. CSW referral.               Action/Plan: NCM will follow for dc needs.   Expected Discharge Date:  08/10/17               Expected Discharge Plan:  Home w Home Health Services  In-House Referral:     Discharge planning Services  CM Consult  Post Acute Care Choice:    Choice offered to:     DME Arranged:    DME Agency:     HH Arranged:    HH Agency:     Status of Service:  In process, will continue to follow  If discussed at Long Length of Stay Meetings, dates discussed:    Additional Comments:  Leone Havenaylor, Vennesa Bastedo [place], RN 08/11/2017, 11:20 AM

## 2017-08-11 NOTE — Progress Notes (Signed)
CSW following for pt needs- [facility] that patient is already being followed by APS- case worker is Lebron ConnersLanae Anderson [phone]- release to share information with APS received- will put in chart  CSW will continue to follow and assist with needs as able- per RN note pt sister hopeful for SNF placement at DC- will need PT order to assess if pt qualifies- paged MD to inform  Burna SisJenna H. Forney Kleinpeter, LCSW Clinical Social Worker [phone]

## 2017-08-11 NOTE — Evaluation (Signed)
Physical Therapy Evaluation Patient Details Name: Jacob Barajas MRN: [id number] DOB: November 03, 1940 Today's Date: 08/11/2017   History of Present Illness  Jacob Barajas is a 52M with chronic diastolic heart failure and dementia (mild) here with sepsis and NSTEMI.  Heart cath showed severe CAD and MV regurgitation, but due to LV dysfunction was deemed not a candidate for CABG.  Clinical Impression  Patient presents with decreased independence with mobility due to decreased strength, decreased balance and decreased activity tolerance with increased SOB with OOB to chair.  Feel continued skilled PT in the acute setting indicated to progress mobility prior to d/c to SNF level rehab.    Follow Up Recommendations SNF;Supervision/Assistance - 24 hour    Equipment Recommendations  None recommended by PT    Recommendations for Other Services       Precautions / Restrictions Precautions Precautions: Fall Precaution Comments: denies any recent falls      Mobility  Bed Mobility Overal bed mobility: Needs Assistance Bed Mobility: Supine to Sit     Supine to sit: Mod assist     General bed mobility comments: assist to scoot and lift trunk  Transfers Overall transfer level: Needs assistance   Transfers: Squat Pivot Transfers     Squat pivot transfers: Mod assist;+2 physical assistance     General transfer comment: pivot to recliner with drop arm squat pivot with PT and RN assist for safety  Ambulation/Gait                Stairs            Wheelchair Mobility    Modified Rankin (Stroke Patients Only)       Balance Overall balance assessment: Needs assistance   Sitting balance-Leahy Scale: Good         Standing balance comment: 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                             Pertinent Vitals/Pain Pain Assessment: No/denies pain    Home Living Family/patient expects to be discharged to:: Skilled nursing  facility Living Arrangements: Children Available Help at Discharge: Family Type of Home: House Home Access: Ramped entrance     Home Layout: One level Home Equipment: Wheelchair - [employer] Corporationmanual;Walker - 2 wheels      Prior Function Level of Independence: Needs assistance   Gait / Transfers Assistance Needed: transfers on his own per pt to w/c, states has been unable to walk despite going to SNF recently due to R foot turning out  ADL's / Homemaking Assistance Needed: states showers in tub with seat on his own (pt not reliable historian)        Hand Dominance   Dominant Hand: Right    Extremity/Trunk Assessment   Upper Extremity Assessment Upper Extremity Assessment: Overall WFL for tasks assessed    Lower Extremity Assessment Lower Extremity Assessment: LLE deficits/detail;RLE deficits/detail RLE Deficits / Details: AROM knee flexion contracture approx 20 degrees from neutral, otherwise WFL strength/ROM LLE Deficits / Details: AROM knee flexion contracture approx 30 degrees from neutral, otherwise WFL strength/ROM       Communication   Communication: HOH  Cognition Arousal/Alertness: Awake/alert Behavior During Therapy: WFL for tasks assessed/performed Overall Cognitive Status: No family/caregiver present to determine baseline cognitive functioning  General Comments      Exercises     Assessment/Plan    PT Assessment Patient needs continued PT services  PT Problem List Decreased range of motion;Decreased mobility;Decreased safety awareness;Decreased balance       PT Treatment Interventions DME instruction;Therapeutic activities;Functional mobility training;Balance training;Therapeutic exercise;Patient/family education;Wheelchair mobility training    PT Goals (Current goals can be found in the Care Plan section)  Acute Rehab PT Goals Patient Stated Goal: To go home PT Goal Formulation: With patient Time For  Goal Achievement: 08/18/17 Potential to Achieve Goals: Fair    Frequency Min 3X/week   Barriers to discharge Decreased caregiver support concerns from sister for care at home with son    Co-evaluation               AM-PAC PT "6 Clicks" Daily Activity  Outcome Measure Difficulty turning over in bed (including adjusting bedclothes, sheets and blankets)?: Unable Difficulty moving from lying on back to sitting on the side of the bed? : Unable Difficulty sitting down on and standing up from a chair with arms (e.g., wheelchair, bedside commode, etc,.)?: Unable Help needed moving to and from a bed to chair (including a wheelchair)?: A Lot Help needed walking in hospital room?: Total Help needed climbing 3-5 steps with a railing? : Total 6 Click Score: 7    End of Session Equipment Utilized During Treatment: Gait belt Activity Tolerance: Patient tolerated treatment well Patient left: in chair;with call bell/phone within reach;with chair alarm set   PT Visit Diagnosis: Other symptoms and signs involving the nervous system (R29.898);Unsteadiness on feet (R26.81)    Time: 1610-9604 PT Time Calculation (min) (ACUTE ONLY): 24 min   Charges:   PT Evaluation $PT Eval Moderate Complexity: 1 Mod PT Treatments $Therapeutic Activity: 8-22 mins   PT G CodesSheran Barajas, Jacob Barajas [phone] 08/11/2017   Elray Mcgregor 08/11/2017, 5:08 PM

## 2017-08-11 NOTE — Progress Notes (Signed)
Progress Note  Patient Name: Milagros LollJohn Smeal Date of Encounter: 08/11/2017  Primary Cardiologist: Dr. Duke Salviaandolph (new)  Subjective   Reports heart burn that started this morning.  No shortness of breath.    Inpatient Medications    Scheduled Meds: . aspirin  81 mg Oral Daily  . atorvastatin  40 mg Oral q1800  . furosemide  40 mg Oral Daily  . Influenza vac split quadrivalent PF  0.5 mL Intramuscular Tomorrow-1000  . isosorbide dinitrate  10 mg Oral TID  . pneumococcal 23 valent vaccine  0.5 mL Intramuscular Tomorrow-1000  . QUEtiapine  12.5 mg Oral QHS  . sodium chloride flush  3 mL Intravenous Q12H   Continuous Infusions: . sodium chloride 10 mL/hr at 08/10/17 0919  . heparin 1,200 Units/hr (08/10/17 1804)   PRN Meds: acetaminophen, dextromethorphan-guaiFENesin, hydrALAZINE, levalbuterol, morphine injection, nitroGLYCERIN, ondansetron (ZOFRAN) IV, sodium chloride flush   Vital Signs    Vitals:   08/11/17 0400 08/11/17 0500 08/11/17 0600 08/11/17 0700  BP: 91/62 103/72 104/77 112/79  Pulse: (!) 106 (!) 107 (!) 105 (!) 105  Resp: (!) 24 (!) 24 (!) 24 (!) 22  Temp:      TempSrc:      SpO2: 92% 95% 98% 94%  Weight:  78.7 kg (173 lb 8 oz)    Height:        Intake/Output Summary (Last 24 hours) at 08/11/17 0743 Last data filed at 08/11/17 [id number]  Gross per 24 hour  Intake          1234.04 ml  Output             1650 ml  Net          -415.96 ml   Filed Weights   08/09/17 0234 08/10/17 0200 08/11/17 0500  Weight: 72.6 kg (160 lb) 79.2 kg (174 lb 9.7 oz) 78.7 kg (173 lb 8 oz)    Telemetry    Sinus tachycardia.  Rate 110s.   - Personally Reviewed  ECG    Sinus tachycardia.  Rate 105.  Prior septal infarct.  Anterolateral ST/T- Personally Reviewed  Physical Exam   VS:  BP 112/79   Pulse (!) 105   Temp 98.4 F (36.9 C) (Oral)   Resp (!) 22   Ht 5\' 9"  (1.753 m)   Wt 78.7 kg (173 lb 8 oz)   SpO2 94%   BMI 25.62 kg/m  , BMI Body mass index is 25.62  kg/m. GENERAL:  Well-appearing.  No acute distress. HEENT: Pupils equal round and reactive, fundi not visualized, oral mucosa unremarkable NECK:  No jugular venous distention, waveform within normal limits, carotid upstroke brisk and symmetric, no bruits, no thyromegaly LUNGS:  Diffuse expiratory wheezes and rhonchi.  No crackles.  HEART:  Tachycardic.  Regular rhythm.  PMI not displaced or sustained,S1 and S2 within normal limits, no S3, no S4, no clicks, no rubs, no murmurs ABD:  Flat, positive bowel sounds normal in frequency in pitch, no bruits, no rebound, no guarding, no midline pulsatile mass, no hepatomegaly, no splenomegaly EXT:  2 plus pulses throughout, no edema, no cyanosis no clubbing SKIN:  No rashes no nodules NEURO:  Cranial nerves II through XII grossly intact, motor grossly intact throughout PSYCH:  Oriented to person.   Labs    Chemistry  Recent Labs Lab 08/09/17 0246 08/10/17 0411 08/11/17 0443  NA 140 134* 135  K 3.8 4.3 3.9  CL 101 98* 98*  CO2 25 27 25   GLUCOSE  169* 147* 107*  BUN 17 23* 23*  CREATININE 1.28* 1.66* 1.28*  CALCIUM 8.8* 8.2* 8.3*  PROT 7.7 6.6 6.7  ALBUMIN 3.9 3.4* 3.4*  AST 95* 207* 139*  ALT 22 43 39  ALKPHOS 47 37* 34*  BILITOT 0.6 1.2 1.4*  GFRNONAA 52* 38* 52*  GFRAA >60 44* >60  ANIONGAP 14 9 12      Hematology  Recent Labs Lab 08/09/17 0246 08/10/17 0411 08/11/17 0443  WBC 18.1* 21.1* 15.3*  RBC 4.57 4.01* 3.64*  HGB 15.1 13.0 11.7*  HCT 44.5 39.3 35.4*  MCV 97.4 98.0 97.3  MCH 33.0 32.4 32.1  MCHC 33.9 33.1 33.1  RDW 13.8 14.1 14.0  PLT 213 193 167    Cardiac Enzymes  Recent Labs Lab 08/09/17 0426 08/09/17 0720 08/09/17 1025  TROPONINI 6.18* 15.25* 27.86*     Recent Labs Lab 08/09/17 0259  TROPIPOC 6.80*     BNP  Recent Labs Lab 08/09/17 0448  BNP 345.2*     DDimer No results for input(s): DDIMER in the last 168 hours.   Radiology    Ct Abdomen Pelvis Wo Contrast  Result Date:  08/10/2017 CLINICAL DATA:  76 y/o M; nausea, vomiting, and abdominal distention. EXAM: CT ABDOMEN AND PELVIS WITHOUT CONTRAST TECHNIQUE: Multidetector CT imaging of the abdomen and pelvis was performed following the standard protocol without IV contrast. COMPARISON:  None. FINDINGS: Lower chest: Small bilateral pleural effusions. Severe coronary artery calcification. Mild diffuse peribronchial thickening. Hepatobiliary: Nonspecific punctate calcification in right lobe of liver likely representing sequelae of granulomatous disease. No other focal liver abnormality is seen. No gallstones, gallbladder wall thickening, or biliary dilatation. Pancreas: Unremarkable. No pancreatic ductal dilatation or surrounding inflammatory changes. Spleen: Normal in size without focal abnormality. Adrenals/Urinary Tract: Adrenal glands are unremarkable. Kidneys are normal, without renal calculi, focal lesion, or hydronephrosis. Bladder is unremarkable. Stomach/Bowel: Stomach is within normal limits. Appendix not identified, no pericecal inflammatory changes. No evidence of bowel wall thickening, distention, or inflammatory changes. No acute process identified. Vascular/Lymphatic: Aortic atherosclerosis. No enlarged abdominal or pelvic lymph nodes. Reproductive: Prostate is unremarkable. Other: No abdominal wall hernia or abnormality. No abdominopelvic ascites. Musculoskeletal: Advanced multilevel degenerative changes of the lumbar spine with multiple levels of canal stenosis greatest at the L4-5 level where it is moderate to severe. No acute fracture identified. IMPRESSION: 1. No acute abdominal or pelvic process identified. 2. Small bilateral pleural effusions. Bronchitic changes in the lung bases. 3. Severe coronary artery and aortic calcific atherosclerosis. 4. Advanced lumbar degenerative changes with multilevel canal stenosis. Electronically Signed   By: Mitzi Hansen M.D.   On: 08/10/2017 03:33   Dg Chest Port 1  View  Result Date: 08/09/2017 CLINICAL DATA:  Respiratory failure, sepsis, CHF and myocardial infarction. EXAM: PORTABLE CHEST 1 VIEW COMPARISON:  Prior study at 0230 hours FINDINGS: The heart size and mediastinal contours are within normal limits. Lungs show persistent mild bronchial and interstitial prominence bilaterally. There may be a component of mild interstitial edema as well as acute bronchitis. No significant focal airspace consolidation or pleural effusions are identified. No evidence of pneumothorax. The visualized skeletal structures are unremarkable. IMPRESSION: Stable mild bronchial and interstitial prominence with potential component of mild interstitial edema as well as acute bronchitis. Electronically Signed   By: Irish Lack M.D.   On: 08/09/2017 08:25    Cardiac Studies   Echo 08/09/17: Study Conclusions  - Left ventricle: The cavity size was normal. Wall thickness was   increased in  a pattern of mild LVH. Mid to apical anteroseptal   akinesis. Akinesis of the true apex. Inferoseptal, inferior,   anterolateral, and anterior severe hypokinesis. The estimated   ejection fraction was 25%. Doppler parameters are consistent with   abnormal left ventricular relaxation (grade 1 diastolic   dysfunction). - Aortic valve: Trileaflet; moderately calcified leaflets.   Sclerosis without stenosis. - Mitral valve: There was moderate regurgitation. - Left atrium: The atrium was mildly dilated. - Right ventricle: The cavity size was normal. Systolic function   was mildly reduced. - Pulmonary arteries: No complete TR doppler jet so unable to   estimate PA systolic pressure. - Systemic veins: The IVC measured 2.1 cm with > 50% respirophasic   variation, suggesting RA pressure 8 mmHg.  LHC 08/09/17:  Prox RCA to Mid RCA lesion, 100 %stenosed.  Mid RCA to Dist RCA lesion, 95 %stenosed.  Dist RCA lesion, 95 %stenosed.  Ost LM lesion, 95 %stenosed.  Prox LAD lesion, 80  %stenosed.  Ost 1st Diag to 1st Diag lesion, 80 %stenosed.  Mid LAD lesion, 100 %stenosed.  Ost Cx lesion, 95 %stenosed.  Ost Cx to Prox Cx lesion, 80 %stenosed.  LM lesion, 95 %stenosed.  Patient Profile     Mr. Radney is a 26M with chronic diastolic heart failure and dementia (mild) here with sepsis and NSTEMI.  He was found to have new onset systolic dysfunction and 3 vessel CAD.  Assessment & Plan    # NSTEMI: # 3 vessel CAD: # Hyperlipidemia: Troponin elevated to 48.  Revealed severe, three-vessel chronically occluded RCA, 95% ostial left main, 100% mid LAD and 95% ostial LCx.  Ideally he would have CABG but is not a candidate due to frailty, dementia and sepsis.  Plan for medical management and palliative care.  This morning he reports heart burn.  I'm concerned this is angina.  Start metoprolol tartrate 12.5mg  bid with plans to consolidate to succinate given his systolic dysfunction. Continue aspirin and atorvastatin.  Consolidate isordil to Imdur 30mg  daily.  Will try a GI cocktail.   # Acute systolic and diastolic heart failure:   # Cardiogenic shock: # Moderate MR: LVEDP was elevated at 21 mmHg on cath.  He was net - 10/23.  Imdur and metoprolol as above.  Creatinine improving with diuresis.  Continue oral lasix. Will  add ARB as BP and renal function improve.   # Acute renal failure: Improving.  Creatinine down to 1.2 from 1.6 10/23.    For questions or updates, please contact [facility] Please consult [email or web address] for contact info under Cardiology/STEMI.      Signed, Chilton Si, MD  08/11/2017, 7:43 AM

## 2017-08-12 DIAGNOSIS — Z515 Encounter for palliative care: Secondary | ICD-10-CM

## 2017-08-12 DIAGNOSIS — J40 Bronchitis, not specified as acute or chronic: Secondary | ICD-10-CM

## 2017-08-12 DIAGNOSIS — R7401 Elevation of levels of liver transaminase levels: Secondary | ICD-10-CM

## 2017-08-12 DIAGNOSIS — M1000 Idiopathic gout, unspecified site: Secondary | ICD-10-CM

## 2017-08-12 DIAGNOSIS — Z7189 Other specified counseling: Secondary | ICD-10-CM

## 2017-08-12 DIAGNOSIS — R740 Nonspecific elevation of levels of transaminase and lactic acid dehydrogenase [LDH]: Secondary | ICD-10-CM

## 2017-08-12 DIAGNOSIS — R748 Abnormal levels of other serum enzymes: Secondary | ICD-10-CM

## 2017-08-12 DIAGNOSIS — N179 Acute kidney failure, unspecified: Secondary | ICD-10-CM

## 2017-08-12 DIAGNOSIS — Z66 Do not resuscitate: Secondary | ICD-10-CM

## 2017-08-12 DIAGNOSIS — R140 Abdominal distension (gaseous): Secondary | ICD-10-CM

## 2017-08-12 DIAGNOSIS — F0391 Unspecified dementia with behavioral disturbance: Secondary | ICD-10-CM

## 2017-08-12 DIAGNOSIS — I2510 Atherosclerotic heart disease of native coronary artery without angina pectoris: Secondary | ICD-10-CM

## 2017-08-12 LAB — CBC WITH DIFFERENTIAL/PLATELET
Basophils Absolute: 0 10*3/uL (ref 0.0–0.1)
Basophils Relative: 0 %
EOS PCT: 0 %
Eosinophils Absolute: 0 10*3/uL (ref 0.0–0.7)
HCT: 35.5 % — ABNORMAL LOW (ref 39.0–52.0)
HEMOGLOBIN: 11.8 g/dL — AB (ref 13.0–17.0)
LYMPHS ABS: 0.9 10*3/uL (ref 0.7–4.0)
LYMPHS PCT: 7 %
MCH: 32.1 pg (ref 26.0–34.0)
MCHC: 33.2 g/dL (ref 30.0–36.0)
MCV: 96.5 fL (ref 78.0–100.0)
MONOS PCT: 5 %
Monocytes Absolute: 0.7 10*3/uL (ref 0.1–1.0)
NEUTROS PCT: 88 %
Neutro Abs: 11.3 10*3/uL — ABNORMAL HIGH (ref 1.7–7.7)
Platelets: 171 10*3/uL (ref 150–400)
RBC: 3.68 MIL/uL — AB (ref 4.22–5.81)
RDW: 14 % (ref 11.5–15.5)
WBC: 12.8 10*3/uL — AB (ref 4.0–10.5)

## 2017-08-12 LAB — BASIC METABOLIC PANEL
Anion gap: 11 (ref 5–15)
BUN: 20 mg/dL (ref 6–20)
CHLORIDE: 100 mmol/L — AB (ref 101–111)
CO2: 25 mmol/L (ref 22–32)
CREATININE: 1.13 mg/dL (ref 0.61–1.24)
Calcium: 8.4 mg/dL — ABNORMAL LOW (ref 8.9–10.3)
GFR calc non Af Amer: >60 mL/min (ref >60)
GLUCOSE: 149 mg/dL — AB (ref 65–99)
Potassium: 4.1 mmol/L (ref 3.5–5.1)
Sodium: 136 mmol/L (ref 135–145)

## 2017-08-12 LAB — MAGNESIUM: Magnesium: 2.3 mg/dL (ref 1.7–2.4)

## 2017-08-12 MED ORDER — ACETAMINOPHEN 650 MG RE SUPP: 650.0000 mg | RECTAL | Status: DC | PRN

## 2017-08-12 MED ORDER — PREDNISONE 20 MG PO TABS: 40.0000 mg | ORAL_TABLET | Freq: Every day | ORAL | Status: DC

## 2017-08-12 MED ORDER — PANTOPRAZOLE SODIUM 40 MG PO TBEC
40.0000 mg | DELAYED_RELEASE_TABLET | Freq: Every day | ORAL | Status: DC
  Administered 2017-08-13 – 2017-08-15 (×3): 40 mg via ORAL
  Filled 2017-08-12 (×3): qty 1

## 2017-08-12 MED ORDER — METOPROLOL SUCCINATE ER 25 MG PO TB24
25.0000 mg | ORAL_TABLET | Freq: Two times a day (BID) | ORAL | Status: DC
  Administered 2017-08-12 – 2017-08-15 (×7): 25 mg via ORAL
  Filled 2017-08-12 (×7): qty 1

## 2017-08-12 NOTE — Progress Notes (Deleted)
eLink Physician-Brief Progress Note Patient Name: Jacob LollJohn Hinely DOB: [birth year] MRN: [id number]   Date of Service  08/12/2017  HPI/Events of Note  T is 101  eICU Interventions  Blood cultures x 2 Rectal tylenol     Intervention Category Evaluation Type: Other  Stephanye Finnicum 08/12/2017, 2:30 AM

## 2017-08-12 NOTE — Progress Notes (Signed)
Progress Note  Patient Name: Jacob Barajas Date of Encounter: 08/12/2017  Primary Cardiologist: Dr. Duke Salvia (new)  Subjective   Feeling well.  Denies chest pain or shortness of breath.  No more heartburn.  Inpatient Medications    Scheduled Meds: . aspirin  81 mg Oral Daily  . atorvastatin  40 mg Oral q1800  . clopidogrel  75 mg Oral Daily  . furosemide  40 mg Oral Daily  . heparin subcutaneous  5,000 Units Subcutaneous Q8H  . Influenza vac split quadrivalent PF  0.5 mL Intramuscular Tomorrow-1000  . isosorbide mononitrate  30 mg Oral Daily  . levalbuterol  1.25 mg Nebulization TID  . methylPREDNISolone (SOLU-MEDROL) injection  60 mg Intravenous Q24H  . metoprolol tartrate  12.5 mg Oral BID  . pneumococcal 23 valent vaccine  0.5 mL Intramuscular Tomorrow-1000  . QUEtiapine  12.5 mg Oral QHS  . sodium chloride flush  3 mL Intravenous Q12H   Continuous Infusions: . sodium chloride Stopped (08/11/17 1600)   PRN Meds: acetaminophen, dextromethorphan-guaiFENesin, gi cocktail, hydrALAZINE, morphine injection, nitroGLYCERIN, ondansetron (ZOFRAN) IV, sodium chloride flush   Vital Signs    Vitals:   08/12/17 0600 08/12/17 0700 08/12/17 0726 08/12/17 0755  BP: (!) 136/100 (!) 124/92 (!) 124/92   Pulse: (!) 115 (!) 106 (!) 103   Resp: 20 (!) 22 11   Temp:    98.3 F (36.8 C)  TempSrc:    Oral  SpO2: 96% 96% 97%   Weight:      Height:        Intake/Output Summary (Last 24 hours) at 08/12/17 0814 Last data filed at 08/12/17 0500  Gross per 24 hour  Intake              574 ml  Output             1850 ml  Net            -1276 ml   Filed Weights   08/09/17 0234 08/10/17 0200 08/11/17 0500  Weight: 72.6 kg (160 lb) 79.2 kg (174 lb 9.7 oz) 78.7 kg (173 lb 8 oz)    Telemetry    Sinus tachycardia.  Rate 100-140s   - Personally Reviewed  ECG    Sinus tachycardia.  Rate 105.  Prior septal infarct.  Anterolateral ST/T changes. - Personally Reviewed  Physical Exam    VS:  BP (!) 124/92   Pulse (!) 103   Temp 98.3 F (36.8 C) (Oral)   Resp 11   Ht 5\' 9"  (1.753 m)   Wt 78.7 kg (173 lb 8 oz)   SpO2 97%   BMI 25.62 kg/m  , BMI Body mass index is 25.62 kg/m. GENERAL:  Well-appearing.  No acute distress. HEENT: Pupils equal round and reactive, fundi not visualized, oral mucosa unremarkable NECK:  No jugular venous distention, waveform within normal limits, carotid upstroke brisk and symmetric, no bruits, no thyromegaly LUNGS:  Diffuse expiratory wheezes and rhonchi.  No crackles.  HEART:  Tachycardic.  Regular rhythm.  PMI not displaced or sustained,S1 and S2 within normal limits, no S3, no S4, no clicks, no rubs, no murmurs ABD:  Flat, positive bowel sounds normal in frequency in pitch, no bruits, no rebound, no guarding, no midline pulsatile mass, no hepatomegaly, no splenomegaly EXT:  2 plus pulses throughout, no edema, no cyanosis no clubbing SKIN:  No rashes no nodules NEURO:  Cranial nerves II through XII grossly intact, motor grossly intact throughout PSYCH:  Oriented  to person.   Labs    Chemistry  Recent Labs Lab 08/09/17 0246 08/10/17 0411 08/11/17 0443 08/12/17 0353  NA 140 134* 135 136  K 3.8 4.3 3.9 4.1  CL 101 98* 98* 100*  CO2 25 27 25 25   GLUCOSE 169* 147* 107* 149*  BUN 17 23* 23* 20  CREATININE 1.28* 1.66* 1.28* 1.13  CALCIUM 8.8* 8.2* 8.3* 8.4*  PROT 7.7 6.6 6.7  --   ALBUMIN 3.9 3.4* 3.4*  --   AST 95* 207* 139*  --   ALT 22 43 39  --   ALKPHOS 47 37* 34*  --   BILITOT 0.6 1.2 1.4*  --   GFRNONAA 52* 38* 52* >60  GFRAA >60 44* >60 >60  ANIONGAP 14 9 12 11      Hematology  Recent Labs Lab 08/10/17 0411 08/11/17 0443 08/11/17 1435 08/12/17 0353  WBC 21.1* 15.3*  --  12.8*  RBC 4.01* 3.64* 3.78* 3.68*  HGB 13.0 11.7*  --  11.8*  HCT 39.3 35.4*  --  35.5*  MCV 98.0 97.3  --  96.5  MCH 32.4 32.1  --  32.1  MCHC 33.1 33.1  --  33.2  RDW 14.1 14.0  --  14.0  PLT 193 167  --  171    Cardiac  Enzymes  Recent Labs Lab 08/09/17 0426 08/09/17 0720 08/09/17 1025  TROPONINI 6.18* 15.25* 27.86*     Recent Labs Lab 08/09/17 0259  TROPIPOC 6.80*     BNP  Recent Labs Lab 08/09/17 0448  BNP 345.2*     DDimer No results for input(s): DDIMER in the last 168 hours.   Radiology    No results found.  Cardiac Studies   Echo 08/09/17: Study Conclusions  - Left ventricle: The cavity size was normal. Wall thickness was   increased in a pattern of mild LVH. Mid to apical anteroseptal   akinesis. Akinesis of the true apex. Inferoseptal, inferior,   anterolateral, and anterior severe hypokinesis. The estimated   ejection fraction was 25%. Doppler parameters are consistent with   abnormal left ventricular relaxation (grade 1 diastolic   dysfunction). - Aortic valve: Trileaflet; moderately calcified leaflets.   Sclerosis without stenosis. - Mitral valve: There was moderate regurgitation. - Left atrium: The atrium was mildly dilated. - Right ventricle: The cavity size was normal. Systolic function   was mildly reduced. - Pulmonary arteries: No complete TR doppler jet so unable to   estimate PA systolic pressure. - Systemic veins: The IVC measured 2.1 cm with > 50% respirophasic   variation, suggesting RA pressure 8 mmHg.  LHC 08/09/17:  Prox RCA to Mid RCA lesion, 100 %stenosed.  Mid RCA to Dist RCA lesion, 95 %stenosed.  Dist RCA lesion, 95 %stenosed.  Ost LM lesion, 95 %stenosed.  Prox LAD lesion, 80 %stenosed.  Ost 1st Diag to 1st Diag lesion, 80 %stenosed.  Mid LAD lesion, 100 %stenosed.  Ost Cx lesion, 95 %stenosed.  Ost Cx to Prox Cx lesion, 80 %stenosed.  LM lesion, 95 %stenosed.  Patient Profile     Jacob Barajas is a [age]M with chronic diastolic heart failure and dementia (mild) here with sepsis and NSTEMI.  He was found to have new onset systolic dysfunction and 3 vessel CAD.  Assessment & Plan    # NSTEMI: # 3 vessel CAD: #  Hyperlipidemia: Troponin elevated to 48.  LHC revealed severe, three-vessel chronically occluded RCA, 95% ostial left main, 100% mid LAD and 95% ostial  LCx.  Ideally he would have CABG but is not a candidate due to frailty, dementia and sepsis.  Plan for medical management and palliative care.  He is feeling well and denies any chest pain.  Increase metoprolol to 25 mg twice daily.  We will switch to succinate given his heart failure.  This can be consolidated to once a day if his blood pressure tolerates it.  Continue Imdur, aspirin, and atorvastatin.  Heparin was stopped and he was started on clopidogrel 10/24.    # Acute systolic and diastolic heart failure:   # Cardiogenic shock: # Moderate MR: Renal function has improved with diuresis.  He was net -1 L yesterday.  He appears euvolemic today.  Continue Lasix 40 mg daily.  If his blood pressure tolerates the beta-blocker as above we may have room to add a low-dose ACE-I/ARB.  # Acute renal failure: Resolved.   He is OK to be discharged from a cardiology standpoint.    For questions or updates, please contact [facility] Please consult [email or web address] for contact info under Cardiology/STEMI.      Signed, Chilton Si, MD  08/12/2017, 8:14 AM

## 2017-08-12 NOTE — Progress Notes (Signed)
PROGRESS NOTE    Jacob Barajas  ZJI:[id number] DOB: April 10, 1941 DOA: 08/09/2017 PCP: Patient, No Pcp Per  Brief Narrative:  76 y.o.WM PMHx Gout, Dementia, Chronic Diastolic CHF (by 2d echo on 02/04/16, who shortness breath, Hypotension and fever.   Per EMS report, they were called out secondary to patient nausea vomiting and vomiting. Patient was found to have fever of 101.8, hypotension with blood pressure 80/40, tachycardia with heart rate up to 130, all seemed desaturation to 80% on room air. After his received approximately 2 L normal saline bolus in ED, his respiratory distress has suddenly worsened. His respiratory rate is up to 40s, oxygen desaturated to 80s. He has diffuse rhonchi and crackles on auscultation. 40 mg of Lasix was ordered by me for suspected fluid overload. BiPAP was started. Patient's oxygen saturation is back to 97% and respiratory rate decreased to 20s with BiPAP. Cardiology was consulted by EDP.  Patient was found to have an NSTEMI and underwent Cardiac Catheterization which revealed severe 3 vessel disease. Unfortunately patient was not a candidate for CABG so he was treated medically. Palliative Care consulted and patient met with them and had a phone conversation with Son. PT evaluated and recommending SNF and plan is for patient to go to SNF with Palliative Care to Follow.   Assessment & Plan:   Principal Problem:   Acute respiratory failure with hypoxia and hypercapnia (HCC) Active Problems:   Gout   Dementia   NSTEMI (non-ST elevated myocardial infarction) (HCC)   Sepsis (HCC)   Bronchitis   AKI (acute kidney injury) (HCC)   Nausea & vomiting   Abdominal distension   Chronic diastolic CHF (congestive heart failure) ([place])   Community acquired pneumonia   Elevated AST (SGOT)  NSTEM with Severe3 vessel CAD -Troponin Elevated to 27.86 -Cath has revealed severe 3V CAD, and the pt is not a candidate for CABG per TCTS -Lesions are too diffuse to be amenable to  PTCA/stenting  -C/w Medical Management and Palliative Care -C/w ASA 81 mg po Daily, Atorvastatin 40 mg po Daily, Clopidpogrel 75 mg po Daily, Metoprolol XL 25 mg po BID, Isosorbide Mononitrate 30 mg po Daily, and NTG 0.4 mg SL q25mn prn CP; No longer anticoagulated with Heparin gtt -Per Cards If BP tolerates Beta-Blocker may have room to add low-dose ACE-I/ARB -Currently Asymptomatic and Cardiology Signed off -Palliative Care meeting with the Patient Today  Acute Combined Systolic and Diastolic CHF - Cardiogenic shock - Lactic acidosis -TTE noted EF 25% w/ grade 1 DD -C/w Metoprolol XL 25 mg po BID, Furosemide 40 mg po Daily -Per Cards If BP tolerates Beta-Blocker may have room to add  -Strict I's/O's, Daily Weights, Patient is +130.6 mL -Transfuse for hemoglobin<8 -10/24 PT/OT consult placed multivessel cardiac disease, S/P NSTEMI evaluate for CIR vs SNF vs outpatient cardiac rehabilitation; PT recommending SNF  Acute respiratory failure with hypoxia and hypercapnia/Positive Rhinovirus CAP -Titrate O2 to maintain SPO2> 93% -Xopenex QID next line -C/w Flutter valve and Mucinex-DM -IV Solu-Medrol 60 mg daily will be changed to po Prednisone 40 mg po daily   Lactic Acidosis  -Due to cardiogenic shock -Patient was on broad-spectrum antibiotics. Monitor for signs of infection. -TMax was 98.2 and WBC Trending down from 21.1 -> 15.3 -> 12.8  Acute Renal Failure/AKI -Due to cardiogenic shock v/s contrast nephropathy - follow w/ diuresis      -BUN/Cr went from 23/1.66 -> 23/1.28 -> 20/1.13  Acute Delirium/Dementia -Family stated patient has episodes of decreased cognition sundowning's vs  baseline dementia? -Ammonia was slightly elevated at 38, B12 was 332 and Folate was 14.2  Nausea / Vomiting and abdominal distention -CT abdomen unrevealing  -Symptoms appear to have resolved  -Lipase was normal at 35  Normocytic Anemia -Anemia Panel showed Iron level of 27, UIBC 166, TIBC 193,  Saturation Ratios, Ferritin 325, Folate 14.2, B12 was 332 -FOBT Pending   Gout -Stable  GERD/Heartburn -C/w GI Cocktail 30 mL TIDprn -Will add Pantoprazole 40 mg po Daily   HLD -Lipid Panel Showed Cholesterol of 146, HDL of 41, LDL of 80, TG of 125, VLDL of 25 -C/w  Atorvastatin 40 mg po Daily  Abnormal AST -Trending Down -Likely in the setting of NSTEMI -Repeat CMP in AM  DVT prophylaxis: Heparin 5,000 sq q8h Code Status: FULL CODE changed to DNR after discussion with Palliative Care Medicine Family Communication: No family present at bedside Disposition Plan: Likely SNF with Palliative Care  Consultants:   Cardiology  TCTS  Palliative Care Medicine   Procedures:  ECHOCARDIOGRAM Study Conclusions  - Left ventricle: The cavity size was normal. Wall thickness was increased in a pattern of mild LVH. Mid to apical anteroseptal akinesis. Akinesis of the true apex. Inferoseptal, inferior, anterolateral, and anterior severe hypokinesis. The estimated ejection fraction was 25%. Doppler parameters are consistent with abnormal left ventricular relaxation (grade 1 diastolic dysfunction). - Aortic valve: Trileaflet; moderately calcified leaflets. Sclerosis without stenosis. - Mitral valve: There was moderate regurgitation. - Left atrium: The atrium was mildly dilated. - Right ventricle: The cavity size was normal. Systolic function was mildly reduced. - Pulmonary arteries: No complete TR doppler jet so unable to estimate PA systolic pressure. - Systemic veins: The IVC measured 2.1 cm with > 50% respirophasic variation, suggesting RA pressure 8 mmHg.  LEFT HEART CATHETERIZATION  Prox RCA to Mid RCA lesion, 100 %stenosed.  Mid RCA to Dist RCA lesion, 95 %stenosed.  Dist RCA lesion, 95 %stenosed.  Ost LM lesion, 95 %stenosed.  Prox LAD lesion, 80 %stenosed.  Ost 1st Diag to 1st Diag lesion, 80 %stenosed.  Mid LAD lesion, 100  %stenosed.  Ost Cx lesion, 95 %stenosed.  Ost Cx to Prox Cx lesion, 80 %stenosed.  LM lesion, 95 %stenosed.   Antimicrobials:  Anti-infectives    Start     Dose/Rate Route Frequency Ordered Stop   08/09/17 1600  vancomycin (VANCOCIN) IVPB 750 mg/150 ml premix  Status:  Discontinued     750 mg 150 mL/hr over 60 Minutes Intravenous Every 12 hours 08/09/17 0513 08/10/17 0901   08/09/17 1000  piperacillin-tazobactam (ZOSYN) IVPB 3.375 g  Status:  Discontinued     3.375 g 12.5 mL/hr over 240 Minutes Intravenous Every 8 hours 08/09/17 0513 08/10/17 0901   08/09/17 0600  azithromycin (ZITHROMAX) 500 mg in dextrose 5 % 250 mL IVPB  Status:  Discontinued     500 mg 250 mL/hr over 60 Minutes Intravenous Every 24 hours 08/09/17 0539 08/10/17 0901   08/09/17 0230  piperacillin-tazobactam (ZOSYN) IVPB 3.375 g     3.375 g 100 mL/hr over 30 Minutes Intravenous  Once 08/09/17 0229 08/09/17 0402   08/09/17 0230  vancomycin (VANCOCIN) IVPB 1000 mg/200 mL premix     1,000 mg 200 mL/hr over 60 Minutes Intravenous  Once 08/09/17 0229 08/09/17 0512     Subjective: Seen and examined at bedside and had no complaints. No CP or SOB. No Nausea or vomiting.   Objective: Vitals:   08/12/17 1000 08/12/17 1100 08/12/17 1139 08/12/17 1608  BP: 124/87 (!) 118/98 114/87   Pulse: (!) 107 (!) 105 97 100  Resp: 16 (!) 25 (!) 23 18  Temp:   (!) 97.2 F (36.2 C)   TempSrc:   Oral   SpO2: 96% 98% 98% 99%  Weight:      Height:        Intake/Output Summary (Last 24 hours) at 08/12/17 1635 Last data filed at 08/12/17 1300  Gross per 24 hour  Intake              240 ml  Output             1100 ml  Net             -860 ml   Filed Weights   08/09/17 0234 08/10/17 0200 08/11/17 0500  Weight: 72.6 kg (160 lb) 79.2 kg (174 lb 9.7 oz) 78.7 kg (173 lb 8 oz)   Examination: Physical Exam:  Constitutional: WN/WD Caucasian male in NAD and appears calm and comfortable Eyes: Lids and conjunctivae normal,  sclerae anicteric  ENMT: External Ears, Nose appear normal. Grossly normal hearing. Mucous membranes are moist.  Neck: Appears normal, supple, no cervical masses, normal ROM, no appreciable thyromegaly, no JVD Respiratory: Diminished to auscultation bilaterally with mild wheezing but no rales, rhonchi or crackles. Normal respiratory effort and patient is not tachypenic. No accessory muscle use.  Cardiovascular: Slightly tachycardic rate but regular rhythm, no appreciable murmurs / rubs / gallops. S1 and S2 auscultated. No extremity edema. 2+ pedal pulses. Abdomen: Soft, non-tender, non-distended. No masses palpated. No appreciable hepatosplenomegaly. Bowel sounds positive.  GU: Deferred. Musculoskeletal: No clubbing / cyanosis of digits/nails. No joint deformity upper and lower extremities. Skin: No rashes, lesions, ulcers on a limited skin evaluation. No induration; Warm and dry.  Neurologic: CN 2-12 grossly intact with no focal deficits. Sensation intact in all 4 Extremities. Romberg sign cerebellar reflexes not assessed.  Psychiatric: Normal judgment and insight. Alert and awake. Slightly confused. Normal mood and appropriate affect.   Data Reviewed: I have personally reviewed following labs and imaging studies  CBC:  Recent Labs Lab 08/09/17 0246 08/10/17 0411 08/11/17 0443 08/12/17 0353  WBC 18.1* 21.1* 15.3* 12.8*  NEUTROABS 13.7*  --   --  11.3*  HGB 15.1 13.0 11.7* 11.8*  HCT 44.5 39.3 35.4* 35.5*  MCV 97.4 98.0 97.3 96.5  PLT 213 193 167 597   Basic Metabolic Panel:  Recent Labs Lab 08/09/17 0246 08/10/17 0411 08/11/17 0443 08/12/17 0353  NA 140 134* 135 136  K 3.8 4.3 3.9 4.1  CL 101 98* 98* 100*  CO2 _0 GLUCOSE 169* 147* 107* 149*  BUN 17 23* 23* 20  CREATININE 1.28* 1.66* 1.28* 1.13  CALCIUM 8.8* 8.2* 8.3* 8.4*  MG  --   --   --  2.3   GFR: Estimated Creatinine Clearance: 54.7 mL/min (by C-G formula based on SCr of 1.13 mg/dL). Liver Function  Tests:  Recent Labs Lab 08/09/17 0246 08/10/17 0411 08/11/17 0443  AST 95* 207* 139*  ALT 22 43 39  ALKPHOS 47 37* 34*  BILITOT 0.6 1.2 1.4*  PROT 7.7 6.6 6.7  ALBUMIN 3.9 3.4* 3.4*    Recent Labs Lab 08/09/17 0720  LIPASE 35    Recent Labs Lab 08/11/17 0443  AMMONIA 38*   Coagulation Profile:  Recent Labs Lab 08/09/17 0720  INR 1.04   Cardiac Enzymes:  Recent Labs Lab 08/09/17 0426 08/09/17 0720 08/09/17 1025  TROPONINI 6.18* 15.25* 27.86*   BNP (last 3 results) No results for input(s): PROBNP in the last 8760 hours. HbA1C: No results for input(s): HGBA1C in the last 72 hours. CBG: No results for input(s): GLUCAP in the last 168 hours. Lipid Profile: No results for input(s): CHOL, HDL, LDLCALC, TRIG, CHOLHDL, LDLDIRECT in the last 72 hours. Thyroid Function Tests: No results for input(s): TSH, T4TOTAL, FREET4, T3FREE, THYROIDAB in the last 72 hours. Anemia Panel:  Recent Labs  08/11/17 0443 08/11/17 1435  VITAMINB12 302 332  FOLATE 12.2 14.2  FERRITIN  --  325  TIBC  --  193*  IRON  --  27*  RETICCTPCT  --  1.4   Sepsis Labs:  Recent Labs Lab 08/09/17 0301 08/09/17 0720 08/09/17 1025  PROCALCITON  --  <0.10  --   LATICACIDVEN 3.63* 4.9* 4.7*    Recent Results (from the past 240 hour(s))  Blood Culture (routine x 2)     Status: None (Preliminary result)   Collection Time: 08/09/17  2:34 AM  Result Value Ref Range Status   Specimen Description RIGHT ANTECUBITAL  Final   Special Requests   Final    BOTTLES DRAWN AEROBIC AND ANAEROBIC Blood Culture adequate volume   Culture   Final    NO GROWTH 3 DAYS Performed at [facility], [place] [address] [place], [place] [address]    Report Status PENDING  Incomplete  Blood Culture (routine x 2)     Status: None (Preliminary result)   Collection Time: 08/09/17  3:10 AM  Result Value Ref Range Status   Specimen Description BLOOD RIGHT FOREARM  Final   Special Requests BOTTLES DRAWN  AEROBIC AND ANAEROBIC BCAV  Final   Culture   Final    NO GROWTH 3 DAYS Performed at [facility], [place] [address], [place], [place] [address]    Report Status PENDING  Incomplete  MRSA PCR Screening     Status: None   Collection Time: 08/09/17  6:39 AM  Result Value Ref Range Status   MRSA by PCR NEGATIVE NEGATIVE Final    Comment:        The GeneXpert MRSA Assay (FDA approved for NASAL specimens only), is one component of a comprehensive MRSA colonization surveillance program. It is not intended to diagnose MRSA infection nor to guide or monitor treatment for MRSA infections.   Urine culture     Status: None   Collection Time: 08/09/17  7:00 AM  Result Value Ref Range Status   Specimen Description URINE, CLEAN CATCH  Final   Special Requests NONE  Final   Culture   Final    NO GROWTH Performed at [facility], [address], [place], [place] [address]    Report Status 08/10/2017 FINAL  Final  Respiratory Panel by PCR     Status: Abnormal   Collection Time: 08/09/17  7:00 AM  Result Value Ref Range Status   Adenovirus NOT DETECTED NOT DETECTED Final   Coronavirus 229E NOT DETECTED NOT DETECTED Final   Coronavirus HKU1 NOT DETECTED NOT DETECTED Final   Coronavirus NL63 NOT DETECTED NOT DETECTED Final   Coronavirus OC43 NOT DETECTED NOT DETECTED Final   Metapneumovirus NOT DETECTED NOT DETECTED Final   Rhinovirus / Enterovirus DETECTED (A) NOT DETECTED Final   Influenza A NOT DETECTED NOT DETECTED Final   Influenza B NOT DETECTED NOT DETECTED Final   Parainfluenza Virus 1 NOT DETECTED NOT DETECTED Final   Parainfluenza  Virus 2 NOT DETECTED NOT DETECTED Final   Parainfluenza Virus 3 NOT DETECTED NOT DETECTED Final   Parainfluenza Virus 4 NOT DETECTED NOT DETECTED Final   Respiratory Syncytial Virus NOT DETECTED NOT DETECTED Final   Bordetella pertussis NOT DETECTED NOT DETECTED Final   Chlamydophila pneumoniae NOT DETECTED NOT DETECTED Final   Mycoplasma  pneumoniae NOT DETECTED NOT DETECTED Final    Comment: Performed at [facility], [place] [address], [place], [place] [address]    Radiology Studies: No results found.   Scheduled Meds: . aspirin  81 mg Oral Daily  . atorvastatin  40 mg Oral q1800  . clopidogrel  75 mg Oral Daily  . furosemide  40 mg Oral Daily  . heparin subcutaneous  5,000 Units Subcutaneous Q8H  . isosorbide mononitrate  30 mg Oral Daily  . levalbuterol  1.25 mg Nebulization TID  . metoprolol succinate  25 mg Oral BID  . pantoprazole  40 mg Oral Daily  . [START ON 08/13/2017] predniSONE  40 mg Oral Q breakfast  . QUEtiapine  12.5 mg Oral QHS  . sodium chloride flush  3 mL Intravenous Q12H   Continuous Infusions: . sodium chloride Stopped (08/11/17 1600)    LOS: 3 days   Kerney Elbe, DO [facility] Pager [phone]  If 7PM-7AM, please contact night-coverage [email or web address] Password [facility] 08/12/2017, 4:35 PM

## 2017-08-12 NOTE — Evaluation (Signed)
Occupational Therapy Evaluation Patient Details Name: Jacob Barajas MRN: [id number] DOB: 1941-09-16 Today's Date: 08/12/2017    History of Present Illness Mr. Googe is a 81M with chronic diastolic heart failure and dementia (mild) here with sepsis and NSTEMI.  Heart cath showed severe CAD and MV regurgitation, but due to LV dysfunction was deemed not a candidate for CABG.   Clinical Impression   This 76 yo male admitted with above presents to acute OT with deficits below (see OT problem list) thus affecting his ability to A more with his basic ADLs. He will benefit from acute OT with follow up OT at SNF to work on increasing independence with basic ADLs from a W/C level.    Follow Up Recommendations  SNF;Supervision/Assistance - 24 hour    Equipment Recommendations  Other (comment) (TBD at next venue)       Precautions / Restrictions Precautions Precautions: Fall      Mobility Bed Mobility Overal bed mobility: Needs Assistance Bed Mobility: Supine to Sit     Supine to sit: Min guard;HOB elevated (use of rail, coming out on left side, increased time)        Transfers Overall transfer level: Needs assistance   Transfers: Squat Pivot Transfers     Squat pivot transfers: Mod assist     General transfer comment: pivot to recliner with drop arm    Balance Overall balance assessment: Needs assistance Sitting-balance support: No upper extremity supported;Feet supported Sitting balance-Leahy Scale: Good                                     ADL either performed or assessed with clinical judgement   ADL Overall ADL's : Needs assistance/impaired Eating/Feeding: Independent;Sitting   Grooming: Set up (supported sitting)   Upper Body Bathing: Set up (supported sitting)   Lower Body Bathing: Moderate assistance Lower Body Bathing Details (indicate cue type and reason): with addtional A for partial stand Mod A Upper Body Dressing : Minimal  assistance Upper Body Dressing Details (indicate cue type and reason): supported sitting Lower Body Dressing: Maximal assistance Lower Body Dressing Details (indicate cue type and reason): with addtional A for partial stand Mod A Toilet Transfer: Moderate assistance Toilet Transfer Details (indicate cue type and reason): lateral scoot Toileting- Clothing Manipulation and Hygiene: Total assistance Toileting - Clothing Manipulation Details (indicate cue type and reason): with addtional A for partial stand Mod A             Vision Patient Visual Report: No change from baseline              Pertinent Vitals/Pain Pain Assessment: No/denies pain     Hand Dominance Right   Extremity/Trunk Assessment Upper Extremity Assessment Upper Extremity Assessment: Overall WFL for tasks assessed           Communication Communication Communication: HOH   Cognition Arousal/Alertness: Awake/alert Behavior During Therapy: WFL for tasks assessed/performed Overall Cognitive Status: History of cognitive impairments - at baseline                                                Home Living Family/patient expects to be discharged to:: Skilled nursing facility  Home Equipment: Wheelchair - [employer] Corporationmanual;Walker - 2 wheels          Prior Functioning/Environment    Gait / Transfers Assistance Needed: transfers on his own per pt to w/c, states has been unable to walk despite going to SNF recently due to R foot turning out ADL's / Homemaking Assistance Needed: states showers in tub with seat on his own. At first he states that he can bath and dress by himself but upon questioning further he states he needs A with clothes up and down as well as socks and shoes (pt not reliable historian)            OT Problem List: Decreased strength;Impaired balance (sitting and/or standing)      OT Treatment/Interventions: Self-care/ADL  training;Therapeutic activities;Patient/family education;DME and/or AE instruction;Balance training    OT Goals(Current goals can be found in the care plan section) Acute Rehab OT Goals Patient Stated Goal: To go home OT Goal Formulation: With patient Time For Goal Achievement: 08/26/17 Potential to Achieve Goals: Good  OT Frequency: Min 2X/week   Barriers to D/C: Decreased caregiver support             AM-PAC PT "6 Clicks" Daily Activity     Outcome Measure Help from another person eating meals?: None Help from another person taking care of personal grooming?: A Little Help from another person toileting, which includes using toliet, bedpan, or urinal?: A Lot Help from another person bathing (including washing, rinsing, drying)?: A Lot Help from another person to put on and taking off regular upper body clothing?: A Little Help from another person to put on and taking off regular lower body clothing?: Total 6 Click Score: 15   End of Session Equipment Utilized During Treatment: Gait belt Nurse Communication: Mobility status (NT: maxi move or lateral scoot)  Activity Tolerance: Patient tolerated treatment well Patient left: in chair;with call bell/phone within reach;with chair alarm set  OT Visit Diagnosis: Other abnormalities of gait and mobility (R26.89);Muscle weakness (generalized) (M62.81)                Time: [id number] OT Time Calculation (min): 21 min Charges:  OT General Charges $OT Visit: 1 Visit OT Evaluation $OT Eval Moderate Complexity: [address] Mod Cathy Amaziah Ghosh, [place] [phone] 08/12/2017

## 2017-08-12 NOTE — Progress Notes (Addendum)
Palliative Consult:  76 yo male with PMH significant for diastolic CHF, dementia, gout admitted 08/09/2017 with SOB, hypotension, and fever r/t  NSTEMI, severe 3 vessel CAD (poor candidate CABG and stents not an option), combined CHF EF 25%, with limited options for cardiac intervention. GOC d/t worsening cardiac function.   I never received return call from Mr. Blase MessKinley's sister. I did speak with [name] and he was slightly confused during conversation but seemed to have overall good idea of what is going on and that he is very sick because of his heart. He was very clear that "when my time comes to see Jesus" he does not fear death and would not desire CPR, shock, or ventilation. However, he has not discussed this with his family. He identified his son as the person who would assist him with big decisions.   I was able to speak with son, Freida Busmanllen, via telephone. I shared my conversation that I had with his father. Freida Busmanllen has good understanding of the severity of his father's condition. Freida Busmanllen confirms that he would not want for his father to be resuscitated either and is supportive of DNR. We also discussed the weakness and having more care needs than can be provided at home. Freida Busmanllen is disappointed as he wants his father to be home where he is happiest. We discussed that their could POSSIBLY be an option in the future for transition to home with the assistance of hospice. For now plan for rehab with palliative follow up.   [name] without distress and more oriented today. Plan for d/c rehab soon.   35 min  Yong ChannelAlicia Drena Ham, NP Palliative Medicine Team Pager # [phone] (M-F 8a-5p) Team Phone # [phone] (Nights/Weekends)

## 2017-08-13 DIAGNOSIS — R0600 Dyspnea, unspecified: Secondary | ICD-10-CM

## 2017-08-13 DIAGNOSIS — R740 Nonspecific elevation of levels of transaminase and lactic acid dehydrogenase [LDH]: Secondary | ICD-10-CM

## 2017-08-13 LAB — COMPREHENSIVE METABOLIC PANEL
ALT: 33 U/L (ref 17–63)
AST: 52 U/L — ABNORMAL HIGH (ref 15–41)
Albumin: 3.3 g/dL — ABNORMAL LOW (ref 3.5–5.0)
Alkaline Phosphatase: 36 U/L — ABNORMAL LOW (ref 38–126)
Anion gap: 9 (ref 5–15)
BUN: 23 mg/dL — ABNORMAL HIGH (ref 6–20)
CHLORIDE: 100 mmol/L — AB (ref 101–111)
CO2: 27 mmol/L (ref 22–32)
CREATININE: 1.31 mg/dL — AB (ref 0.61–1.24)
Calcium: 8.3 mg/dL — ABNORMAL LOW (ref 8.9–10.3)
GFR calc non Af Amer: 51 mL/min — ABNORMAL LOW (ref >60)
GFR, EST AFRICAN AMERICAN: 59 mL/min — AB (ref >60)
Glucose, Bld: 95 mg/dL (ref 65–99)
POTASSIUM: 3.6 mmol/L (ref 3.5–5.1)
SODIUM: 136 mmol/L (ref 135–145)
Total Bilirubin: 1.2 mg/dL (ref 0.3–1.2)
Total Protein: 6.8 g/dL (ref 6.5–8.1)

## 2017-08-13 LAB — CBC WITH DIFFERENTIAL/PLATELET
BASOS ABS: 0 10*3/uL (ref 0.0–0.1)
Basophils Relative: 0 %
EOS ABS: 0.1 10*3/uL (ref 0.0–0.7)
EOS PCT: 1 %
HCT: 34.2 % — ABNORMAL LOW (ref 39.0–52.0)
Hemoglobin: 11.5 g/dL — ABNORMAL LOW (ref 13.0–17.0)
Lymphocytes Relative: 21 %
Lymphs Abs: 2.7 10*3/uL (ref 0.7–4.0)
MCH: 32.7 pg (ref 26.0–34.0)
MCHC: 33.6 g/dL (ref 30.0–36.0)
MCV: 97.2 fL (ref 78.0–100.0)
MONO ABS: 0.9 10*3/uL (ref 0.1–1.0)
Monocytes Relative: 7 %
Neutro Abs: 9.2 10*3/uL — ABNORMAL HIGH (ref 1.7–7.7)
Neutrophils Relative %: 71 %
PLATELETS: 176 10*3/uL (ref 150–400)
RBC: 3.52 MIL/uL — AB (ref 4.22–5.81)
RDW: 14.1 % (ref 11.5–15.5)
WBC: 12.9 10*3/uL — AB (ref 4.0–10.5)

## 2017-08-13 LAB — PHOSPHORUS: PHOSPHORUS: 4 mg/dL (ref 2.5–4.6)

## 2017-08-13 LAB — MAGNESIUM: MAGNESIUM: 2.2 mg/dL (ref 1.7–2.4)

## 2017-08-13 MED ORDER — METHYLPREDNISOLONE SODIUM SUCC 40 MG IJ SOLR
40.0000 mg | Freq: Two times a day (BID) | INTRAMUSCULAR | Status: DC
  Administered 2017-08-13 – 2017-08-14 (×3): 40 mg via INTRAVENOUS
  Filled 2017-08-13 (×3): qty 1

## 2017-08-13 NOTE — Clinical Social Work Note (Signed)
Clinical Social Work Assessment  Patient Details  Name: Jacob Barajas MRN: [id number] Date of Birth: Sep 11, 1941  Date of referral:  08/13/17               Reason for consult:  Facility Placement                Permission sought to share information with:  Oceanographeracility Contact Representative Permission granted to share information::  Yes, Verbal Permission Granted  Name::     Jacob Barajas, Jacob Barajas  Agency::  SNF  Relationship::  sister, son  Contact Information:     Housing/Transportation Living arrangements for the past 2 months:  Single Family Home Source of Information:  Adult Children, Other (Comment Required) (sibling) Patient Interpreter Needed:  None Criminal Activity/Legal Involvement Pertinent to Current Situation/Hospitalization:  No - Comment as needed Significant Relationships:  Adult Children, Siblings Lives with:  Adult Children Do you feel safe going back to the place where you live?  No Need for family participation in patient care:  No (Coment)  Care giving concerns:  Pt does not have needed assistance at home.   Social Worker assessment / plan:  CSW spoke with pt sister and APS worker concerning patient need for SNF- explained SNF and SNF referral process.  Employment status:  Retired Database administratornsurance information:  Managed Medicare PT Recommendations:  Skilled Nursing Facility Information / Referral to community resources:  Skilled Nursing Facility  Patient/Family's Response to care:  Pt family and APS agreeable to SNF at time of DC.  Patient/Family's Understanding of and Emotional Response to Diagnosis, Current Treatment, and Prognosis:  Unclear understanding- hopeful that pt will be able to transfer to another care facility after SNF.  Emotional Assessment Appearance:  Appears stated age Attitude/Demeanor/Rapport:  Unable to Assess Affect (typically observed):  Unable to Assess Orientation:  Oriented to Self, Oriented to Place Alcohol / Substance use:  Not Applicable Psych  involvement (Current and /or in the community):  No (Comment)  Discharge Needs  Concerns to be addressed:  Care Coordination Readmission within the last 30 days:  No Current discharge risk:  Physical Impairment Barriers to Discharge:  Continued Medical Work up   Burna SisUris, Dairon Procter H, LCSW 08/13/2017, 5:11 PM

## 2017-08-13 NOTE — [place] FL2 (Signed)
MEDICAID FL2 LEVEL OF CARE SCREENING TOOL     IDENTIFICATION  Patient Name: Jacob Barajas Birthdate: August 17, 1941 Sex: male Admission Date (Current Location): 08/09/2017  [facility] and [place] Number:  Producer, television/film/video and Address:  [facility], [address] [place], [place] [address]      Provider Number: [id number]  Attending Physician Name and Address:  Merlene Laughter, DO  Relative Name and Phone Number:       Current Level of Care: Hospital Recommended Level of Care: Skilled Nursing Facility Prior Approval Number:    Date Approved/Denied:   PASRR Number: [id number]  Discharge Plan: SNF    Current Diagnoses: Patient Active Problem List   Diagnosis Date Noted  . Elevated AST (SGOT) 08/12/2017  . NSTEMI (non-ST elevated myocardial infarction) (HCC) 08/09/2017  . Sepsis (HCC) 08/09/2017  . Bronchitis 08/09/2017  . AKI (acute kidney injury) (HCC) 08/09/2017  . Nausea & vomiting 08/09/2017  . Abdominal distension 08/09/2017  . Acute respiratory failure with hypoxia and hypercapnia (HCC) 08/09/2017  . Chronic diastolic CHF (congestive heart failure) (HCC) 08/09/2017  . Community acquired pneumonia   . Dementia 06/14/2016  . Urinary retention 05/05/2016  . Metabolic encephalopathy 05/04/2016  . Orthostatic hypotension 02/03/2016  . Gout 02/03/2016  . Syncope 02/02/2016    Orientation RESPIRATION BLADDER Height & Weight     Self, Place  Normal Incontinent, External catheter Weight: 173 lb 8 oz (78.7 kg) Height:  5\' 9"  (175.3 cm)  BEHAVIORAL SYMPTOMS/MOOD NEUROLOGICAL BOWEL NUTRITION STATUS      Continent Diet (cardiac)  AMBULATORY STATUS COMMUNICATION OF NEEDS Skin   Extensive Assist Verbally Normal                       Personal Care Assistance Level of Assistance  Bathing, Dressing Bathing Assistance: Maximum assistance   Dressing Assistance: Maximum assistance     Functional Limitations Info              SPECIAL CARE FACTORS FREQUENCY  PT (By licensed PT), OT (By licensed OT)     PT Frequency: 5/wk OT Frequency: 5/wk            Contractures      Additional Factors Info  Code Status, Allergies, Psychotropic Code Status Info: DNR Allergies Info: NKA Psychotropic Info: seroquel         Current Medications (08/13/2017):  This is the current hospital active medication list Current Facility-Administered Medications  Medication Dose Route Frequency Provider Last Rate Last Dose  . 0.9 %  sodium chloride infusion   Intravenous Continuous Lonia Blood, MD   Stopped at 08/11/17 1600  . acetaminophen (TYLENOL) tablet 650 mg  650 mg Oral Q4H PRN Lonia Blood, MD      . aspirin chewable tablet 81 mg  81 mg Oral Daily Runell Gess, MD   81 mg at 08/13/17 1110  . atorvastatin (LIPITOR) tablet 40 mg  40 mg Oral q1800 Osvaldo Shipper, MD   40 mg at 08/12/17 1733  . clopidogrel (PLAVIX) tablet 75 mg  75 mg Oral Daily Chilton Si, MD   75 mg at 08/13/17 1111  . dextromethorphan-guaiFENesin (MUCINEX DM) 30-600 MG per 12 hr tablet 1 tablet  1 tablet Oral BID PRN Lorretta Harp, MD   1 tablet at 08/10/17 1204  . gi cocktail (Maalox,Lidocaine,Donnatal)  30 mL Oral TID PRN Chilton Si, MD   30 mL at 08/11/17 2020  .  heparin injection 5,000 Units  5,000 Units Subcutaneous Q8H Silvana NewnessMeyer, Andrew D, RPH   5,000 Units at 08/13/17 1455  . hydrALAZINE (APRESOLINE) injection 5 mg  5 mg Intravenous Q2H PRN Lorretta HarpNiu, Xilin, MD      . isosorbide mononitrate (IMDUR) 24 hr tablet 30 mg  30 mg Oral Daily Chilton Siandolph, Tiffany, MD   30 mg at 08/13/17 1110  . levalbuterol (XOPENEX) nebulizer solution 1.25 mg  1.25 mg Nebulization TID Drema DallasWoods, Curtis J, MD   1.25 mg at 08/13/17 1427  . methylPREDNISolone sodium succinate (SOLU-MEDROL) 40 mg/mL injection 40 mg  40 mg Intravenous Q12H Sheikh, Kateri McOmair Twin LakesLatif, DO   40 mg at 08/13/17 1110  . metoprolol succinate (TOPROL-XL) 24 hr tablet 25 mg  25 mg Oral BID  Chilton Siandolph, Tiffany, MD   25 mg at 08/13/17 1110  . morphine 4 MG/ML injection 2 mg  2 mg Intravenous Q1H PRN Runell GessBerry, Jonathan J, MD      . nitroGLYCERIN (NITROSTAT) SL tablet 0.4 mg  0.4 mg Sublingual Q5 min PRN Lorretta HarpNiu, Xilin, MD      . ondansetron [facility](ZOFRAN) injection 4 mg  4 mg Intravenous Q6H PRN Runell GessBerry, Jonathan J, MD      . pantoprazole (PROTONIX) EC tablet 40 mg  40 mg Oral Daily Marguerita MerlesSheikh, Omair EmpireLatif, DO   40 mg at 08/13/17 1110  . QUEtiapine (SEROQUEL) tablet 12.5 mg  12.5 mg Oral QHS Lonia BloodMcClung, Jeffrey T, MD   12.5 mg at 08/12/17 2206  . sodium chloride flush (NS) 0.9 % injection 3 mL  3 mL Intravenous Q12H Runell GessBerry, Jonathan J, MD   3 mL at 08/12/17 2207  . sodium chloride flush (NS) 0.9 % injection 3 mL  3 mL Intravenous PRN Runell GessBerry, Jonathan J, MD         Discharge Medications: Please see discharge summary for a list of discharge medications.  Relevant Imaging Results:  Relevant Lab Results:   Additional Information SS#: [id number]  Burna SisUris, Mikella Linsley H, LCSW

## 2017-08-13 NOTE — Progress Notes (Signed)
Physical Therapy Treatment Patient Details Name: Jacob Barajas MRN: [id number] DOB: Mar 03, 1941 Today's Date: 08/13/2017    History of Present Illness Mr. Saldivar is a 18M with chronic diastolic heart failure and dementia (mild) here with sepsis and NSTEMI.  Heart cath showed severe CAD and MV regurgitation, but due to LV dysfunction was deemed not a candidate for CABG.    PT Comments    Patient required mod A for functional transfers EOB to w/c and w/c to recliner. Pt demonstrated decreased awareness of safety and deficits and continues to be a risk for falls. Continue to progress as tolerated with anticipated d/c to SNF for further skilled PT services.     Follow Up Recommendations  SNF;Supervision/Assistance - 24 hour     Equipment Recommendations  None recommended by PT    Recommendations for Other Services       Precautions / Restrictions Precautions Precautions: Fall    Mobility  Bed Mobility Overal bed mobility: Needs Assistance Bed Mobility: Supine to Sit     Supine to sit: HOB elevated;Min assist (use of rail, coming out on left side, increased time)     General bed mobility comments: assist to elevate trunk into sitting with cues for technique and use of rail  Transfers Overall transfer level: Needs assistance   Transfers: Squat Pivot Transfers     Squat pivot transfers: Mod assist     General transfer comment: assist to pivot from EOB to w/c and w/c to recliner; cues for technique and hand placement   Ambulation/Gait                 Administrator mobility: Yes Wheelchair propulsion: Both upper extremities Wheelchair parts: Supervision/cueing Distance:  (128ft X2) Wheelchair Assistance Details (indicate cue type and reason): pt required supervision for safety and min cues for ensuring brakes are locked  Modified Rankin (Stroke Patients Only)       Balance Overall balance  assessment: Needs assistance Sitting-balance support: No upper extremity supported;Feet supported Sitting balance-Leahy Scale: Good         Standing balance comment: 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                            Cognition Arousal/Alertness: Awake/alert Behavior During Therapy: WFL for tasks assessed/performed Overall Cognitive Status: History of cognitive impairments - at baseline                                        Exercises      General Comments        Pertinent Vitals/Pain Pain Assessment: No/denies pain    Home Living                      Prior Function            PT Goals (current goals can now be found in the care plan section) Acute Rehab PT Goals Patient Stated Goal: To go home PT Goal Formulation: With patient Time For Goal Achievement: 08/18/17 Potential to Achieve Goals: Fair Progress towards PT goals: Progressing toward goals    Frequency    Min 3X/week      PT Plan Current plan remains appropriate    Co-evaluation  AM-PAC PT "6 Clicks" Daily Activity  Outcome Measure  Difficulty turning over in bed (including adjusting bedclothes, sheets and blankets)?: Unable Difficulty moving from lying on back to sitting on the side of the bed? : Unable Difficulty sitting down on and standing up from a chair with arms (e.g., wheelchair, bedside commode, etc,.)?: Unable Help needed moving to and from a bed to chair (including a wheelchair)?: A Lot Help needed walking in hospital room?: Total Help needed climbing 3-5 steps with a railing? : Total 6 Click Score: 7    End of Session Equipment Utilized During Treatment: Gait belt Activity Tolerance: Patient tolerated treatment well Patient left: in chair;with call bell/phone within reach;with chair alarm set Nurse Communication: Mobility status PT Visit Diagnosis: Other symptoms and signs involving the nervous system  (R29.898);Unsteadiness on feet (R26.81)     Time: [id number] PT Time Calculation (min) (ACUTE ONLY): 39 min  Charges:  $Therapeutic Activity: 23-37 mins $Wheel Chair Management: 8-22 mins                    G Codes:       Erline LevineKellyn Hopelynn Gartland, PTA Pager: [phone]     Carolynne EdouardKellyn R Kimmy Parish 08/13/2017, 2:18 PM

## 2017-08-13 NOTE — Progress Notes (Signed)
Patient pulled out his IV and monitor. MD would like another IV and keep the tele monitor. Notified family that the patient is agitated.   Kimmy Totten, Charlaine DaltonAnn Brooke RN

## 2017-08-13 NOTE — Progress Notes (Signed)
Pt family would like [facility] for rehab- they can accept patient over the wknd if stable  [employer] auth initiated- will get determination on 10/27  Burna SisJenna H. Erendira Crabtree, LCSW Clinical Social Worker [phone]

## 2017-08-13 NOTE — Plan of Care (Signed)
Problem: Tissue Perfusion: Goal: Risk factors for ineffective tissue perfusion will decrease Outcome: Progressing Continue with current care plan   

## 2017-08-13 NOTE — Progress Notes (Addendum)
PROGRESS NOTE    Jacob Barajas  YOV:[id number] DOB: Apr 26, 1941 DOA: 08/09/2017 PCP: Patient, No Pcp Per  Brief Narrative:  76 y.o.WM PMHx Gout, Dementia, Chronic Diastolic CHF (by 2d echo on 02/04/16, who shortness breath, Hypotension and fever.   Per EMS report, they were called out secondary to patient nausea vomiting and vomiting. Patient was found to have fever of 101.8, hypotension with blood pressure 80/40, tachycardia with heart rate up to 130, all seemed desaturation to 80% on room air. After his received approximately 2 L normal saline bolus in ED, his respiratory distress has suddenly worsened. His respiratory rate is up to 40s, oxygen desaturated to 80s. He has diffuse rhonchi and crackles on auscultation. 40 mg of Lasix was ordered by me for suspected fluid overload. BiPAP was started. Patient's oxygen saturation is back to 97% and respiratory rate decreased to 20s with BiPAP. Cardiology was consulted by EDP.  Patient was found to have an NSTEMI and underwent Cardiac Catheterization which revealed severe 3 vessel disease. Unfortunately patient was not a candidate for CABG so he was treated medically. Palliative Care consulted and patient met with them and had a phone conversation with Son. PT evaluated and recommending SNF and plan is for patient to go to SNF with Palliative Care to Follow. 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, and the pt is not a  candidate for CABG per TCTS -Lesions are too diffuse to be amenable to PTCA/stenting  -C/w Medical Management and Palliative Care -C/w ASA 81 mg po Daily, Atorvastatin 40 mg po Daily, Clopidpogrel 75 mg po Daily, Metoprolol XL 25 mg po BID, Isosorbide Mononitrate 30 mg po Daily, and NTG 0.4 mg SL q66mn prn CP; No longer anticoagulated with Heparin gtt -Per Cards If BP tolerates Beta-Blocker may have room to add low-dose ACE-I/ARB -Currently Asymptomatic and Cardiology Signed off -Palliative Care meeting with the Patient Today  Acute Combined Systolic and Diastolic CHF - Cardiogenic shock - Lactic acidosis -TTE noted EF 25% w/ grade 1 DD -C/w Metoprolol XL 25 mg po BID,  -Held Furosemide 40 mg po Daily given worsening Kidney Fxn  -Per Cards If BP tolerates Beta-Blocker may have room to add  -Strict I's/O's, Daily Weights, Patient is -1,126.4 mL -Transfuse for hemoglobin<8 -10/24 PT/OT consult placed multivessel cardiac disease, S/P NSTEMI evaluate for CIR vs SNF vs outpatient cardiac rehabilitation; PT recommending SNF  Acute respiratory failure with hypoxia and hypercapnia/Positive Rhinovirus CAP -Titrate O2 to maintain SPO2> 93% -Xopenex QID changed to TID -WBC went from 15.3 -> 12.8 -> 12.9 -C/w Flutter valve and Mucinex-DM -IV Solu-Medrol 60 mg daily was changed to po Prednisone 40 mg po daily yesterday, but patient still had some wheezing so will give IV 40 Solumedrol BID  Lactic Acidosis  -Due to cardiogenic shock -Patient was on broad-spectrum antibiotics. Monitor for signs of infection. -TMax was 98.2 and WBC Trending down from 21.1 -> 15.3 -> 12.8  Acute  Renal Failure/AKI -Due to cardiogenic shock v/s contrast nephropathy - follow w/ diuresis      -BUN/Cr went from 23/1.66 -> 23/1.28 -> 20/1.13 -> 23/1.31 -Held Lasix today -Repeat CMP in AM   Acute Delirium/Dementia -Family stated patient has episodes of decreased cognition sundowning's vs baseline dementia? -Ammonia  was slightly elevated at 38, B12 was 332 and Folate was 14.2 -Patient was agitated today and removed IV and will attempt to reinsert -C/w Quetiapine 12.5 mg po qHS  Nausea / Vomiting and abdominal distention -CT abdomen unrevealing  -Symptoms appear to have resolved  -Lipase was normal at 35  Normocytic Anemia -Hb/Hct went from 13.0/39.3 -> 11.7/35.4 -> 11.8/35.5 -> 11.5/34.2 -Anemia Panel showed Iron level of 27, UIBC 166, TIBC 193, Saturation Ratios, Ferritin 325, Folate 14.2, B12 was 332 -FOBT Pending  -Repeat CBC with Diff in AM   Gout -Stable  GERD/Heartburn -C/w GI Cocktail 30 mL TIDprn -C/w Pantoprazole 40 mg po Daily   HLD -Lipid Panel Showed Cholesterol of 146, HDL of 41, LDL of 80, TG of 125, VLDL of 25 -C/w  Atorvastatin 40 mg po Daily  Abnormal AST -Trending Down as AST went from 207 -> 139 -> 52 -Likely in the setting of NSTEMI -Repeat CMP in AM  DVT prophylaxis: Heparin 5,000 sq q8h Code Status: FULL CODE changed to DNR after discussion with Palliative Care Medicine Family Communication: No family present at bedside Disposition Plan: Likely SNF with Palliative Care in AM   Consultants:   Cardiology  TCTS  Palliative Care Medicine   Procedures:  ECHOCARDIOGRAM Study Conclusions  - Left ventricle: The cavity size was normal. Wall thickness was increased in a pattern of mild LVH. Mid to apical anteroseptal akinesis. Akinesis of the true apex. Inferoseptal, inferior, anterolateral, and anterior severe hypokinesis. The estimated ejection fraction was 25%. Doppler parameters are consistent with abnormal left ventricular relaxation (grade 1 diastolic dysfunction). - Aortic valve: Trileaflet; moderately calcified leaflets. Sclerosis without stenosis. - Mitral valve: There was moderate regurgitation. - Left atrium: The atrium was mildly dilated. - Right ventricle: The cavity size was normal. Systolic function was mildly  reduced. - Pulmonary arteries: No complete TR doppler jet so unable to estimate PA systolic pressure. - Systemic veins: The IVC measured 2.1 cm with > 50% respirophasic variation, suggesting RA pressure 8 mmHg.  LEFT HEART CATHETERIZATION  Prox RCA to Mid RCA lesion, 100 %stenosed.  Mid RCA to Dist RCA lesion, 95 %stenosed.  Dist RCA lesion, 95 %stenosed.  Ost LM lesion, 95 %stenosed.  Prox LAD lesion, 80 %stenosed.  Ost 1st Diag to 1st Diag lesion, 80 %stenosed.  Mid LAD lesion, 100 %stenosed.  Ost Cx lesion, 95 %stenosed.  Ost Cx to Prox Cx lesion, 80 %stenosed.  LM lesion, 95 %stenosed.   Antimicrobials:  Anti-infectives    Start     Dose/Rate Route Frequency Ordered Stop   08/09/17 1600  vancomycin (VANCOCIN) IVPB 750 mg/150 ml premix  Status:  Discontinued     750 mg 150 mL/hr over 60 Minutes Intravenous Every 12 hours 08/09/17 0513 08/10/17 0901   08/09/17 1000  piperacillin-tazobactam (ZOSYN) IVPB 3.375 g  Status:  Discontinued     3.375 g 12.5 mL/hr over 240 Minutes Intravenous Every 8 hours 08/09/17 0513 08/10/17 0901   08/09/17 0600  azithromycin (ZITHROMAX) 500 mg in dextrose 5 % 250 mL IVPB  Status:  Discontinued     500 mg 250 mL/hr over 60 Minutes Intravenous Every 24 hours 08/09/17 0539 08/10/17 0901  08/09/17 0230  piperacillin-tazobactam (ZOSYN) IVPB 3.375 g     3.375 g 100 mL/hr over 30 Minutes Intravenous  Once 08/09/17 0229 08/09/17 0402   08/09/17 0230  vancomycin (VANCOCIN) IVPB 1000 mg/200 mL premix     1,000 mg 200 mL/hr over 60 Minutes Intravenous  Once 08/09/17 0229 08/09/17 0512     Subjective: Seen and examined at bedside and was resting and awoken from sleep and had no complaints. No CP or SOB.   Objective: Vitals:   08/13/17 0438 08/13/17 0804 08/13/17 1429 08/13/17 1435  BP: 101/69   110/74  Pulse: 91   85  Resp: (!) 27   (!) 25  Temp: 98.5 F (36.9 C)   (!) 97.3 F (36.3 C)  TempSrc: Oral   Oral  SpO2:  95% 95% 100%   Weight:      Height:        Intake/Output Summary (Last 24 hours) at 08/13/17 1746 Last data filed at 08/13/17 0439  Gross per 24 hour  Intake                3 ml  Output              700 ml  Net             -697 ml   Filed Weights   08/09/17 0234 08/10/17 0200 08/11/17 0500  Weight: 72.6 kg (160 lb) 79.2 kg (174 lb 9.7 oz) 78.7 kg (173 lb 8 oz)   Examination: Physical Exam:  Constitutional: WN/WD Caucasian Male in NAD appears calm and comfortable Eyes: Sclerae anicteric. Lids Normal ENMT: External ears and nose appear normal. MMM Neck: Supple with no JVD Respiratory: Diminished with expiratory wheezing bilaterally. No appreciable rales, rhonchi, or crackles. Patient was not tachypenic or using any accessory muscles to breathe Cardiovascular: RRR, no appreciable m/r/g. No extremity edema Abdomen: Soft, NT, ND. Bowel sounds present GU: Deferred Musculoskeletal: No contractures. No cyanosis. No join deformities appreciated Skin:  Warm and Dry. No rashes or lesions on a limited skin eval Neurologic: CN 2-12 grossly intact with no appreciable deficits. Sensation intact in all 4 Extremities.  Psychiatric: Normal mood and affect. Intact judgement and insight  Data Reviewed: I have personally reviewed following labs and imaging studies  CBC:  Recent Labs Lab 08/09/17 0246 08/10/17 0411 08/11/17 0443 08/12/17 0353 08/13/17 0324  WBC 18.1* 21.1* 15.3* 12.8* 12.9*  NEUTROABS 13.7*  --   --  11.3* 9.2*  HGB 15.1 13.0 11.7* 11.8* 11.5*  HCT 44.5 39.3 35.4* 35.5* 34.2*  MCV 97.4 98.0 97.3 96.5 97.2  PLT 213 193 167 171 449   Basic Metabolic Panel:  Recent Labs Lab 08/09/17 0246 08/10/17 0411 08/11/17 0443 08/12/17 0353 08/13/17 0324  NA 140 134* 135 136 136  K 3.8 4.3 3.9 4.1 3.6  CL 101 98* 98* 100* 100*  CO2 _0 GLUCOSE 169* 147* 107* 149* 95  BUN 17 23* 23* 20 23*  CREATININE 1.28* 1.66* 1.28* 1.13 1.31*  CALCIUM 8.8* 8.2* 8.3* 8.4* 8.3*  MG  --    --   --  2.3 2.2  PHOS  --   --   --   --  4.0   GFR: Estimated Creatinine Clearance: 47.2 mL/min (A) (by C-G formula based on SCr of 1.31 mg/dL (H)). Liver Function Tests:  Recent Labs Lab 08/09/17 0246 08/10/17 0411 08/11/17 0443 08/13/17 0324  AST 95* 207* 139* 52*  ALT 22  43 39 33  ALKPHOS 47 37* 34* 36*  BILITOT 0.6 1.2 1.4* 1.2  PROT 7.7 6.6 6.7 6.8  ALBUMIN 3.9 3.4* 3.4* 3.3*    Recent Labs Lab 08/09/17 0720  LIPASE 35    Recent Labs Lab 08/11/17 0443  AMMONIA 38*   Coagulation Profile:  Recent Labs Lab 08/09/17 0720  INR 1.04   Cardiac Enzymes:  Recent Labs Lab 08/09/17 0426 08/09/17 0720 08/09/17 1025  TROPONINI 6.18* 15.25* 27.86*   BNP (last 3 results) No results for input(s): PROBNP in the last 8760 hours. HbA1C: No results for input(s): HGBA1C in the last 72 hours. CBG: No results for input(s): GLUCAP in the last 168 hours. Lipid Profile: No results for input(s): CHOL, HDL, LDLCALC, TRIG, CHOLHDL, LDLDIRECT in the last 72 hours. Thyroid Function Tests: No results for input(s): TSH, T4TOTAL, FREET4, T3FREE, THYROIDAB in the last 72 hours. Anemia Panel:  Recent Labs  08/11/17 0443 08/11/17 1435  VITAMINB12 302 332  FOLATE 12.2 14.2  FERRITIN  --  325  TIBC  --  193*  IRON  --  27*  RETICCTPCT  --  1.4   Sepsis Labs:  Recent Labs Lab 08/09/17 0301 08/09/17 0720 08/09/17 1025  PROCALCITON  --  <0.10  --   LATICACIDVEN 3.63* 4.9* 4.7*    Recent Results (from the past 240 hour(s))  Blood Culture (routine x 2)     Status: None (Preliminary result)   Collection Time: 08/09/17  2:34 AM  Result Value Ref Range Status   Specimen Description RIGHT ANTECUBITAL  Final   Special Requests   Final    BOTTLES DRAWN AEROBIC AND ANAEROBIC Blood Culture adequate volume   Culture   Final    NO GROWTH 4 DAYS Performed at [facility], [place] [address], [place], [place] [address]    Report Status PENDING  Incomplete  Blood Culture  (routine x 2)     Status: None (Preliminary result)   Collection Time: 08/09/17  3:10 AM  Result Value Ref Range Status   Specimen Description BLOOD RIGHT FOREARM  Final   Special Requests BOTTLES DRAWN AEROBIC AND ANAEROBIC BCAV  Final   Culture   Final    NO GROWTH 4 DAYS Performed at [facility], [place] [address], [place], [place] [address]    Report Status PENDING  Incomplete  MRSA PCR Screening     Status: None   Collection Time: 08/09/17  6:39 AM  Result Value Ref Range Status   MRSA by PCR NEGATIVE NEGATIVE Final    Comment:        The GeneXpert MRSA Assay (FDA approved for NASAL specimens only), is one component of a comprehensive MRSA colonization surveillance program. It is not intended to diagnose MRSA infection nor to guide or monitor treatment for MRSA infections.   Urine culture     Status: None   Collection Time: 08/09/17  7:00 AM  Result Value Ref Range Status   Specimen Description URINE, CLEAN CATCH  Final   Special Requests NONE  Final   Culture   Final    NO GROWTH Performed at [facility], [address], [place], [place] [address]    Report Status 08/10/2017 FINAL  Final  Respiratory Panel by PCR     Status: Abnormal   Collection Time: 08/09/17  7:00 AM  Result Value Ref Range Status   Adenovirus NOT DETECTED NOT DETECTED Final   Coronavirus 229E NOT DETECTED NOT DETECTED  Final   Coronavirus HKU1 NOT DETECTED NOT DETECTED Final   Coronavirus NL63 NOT DETECTED NOT DETECTED Final   Coronavirus OC43 NOT DETECTED NOT DETECTED Final   Metapneumovirus NOT DETECTED NOT DETECTED Final   Rhinovirus / Enterovirus DETECTED (A) NOT DETECTED Final   Influenza A NOT DETECTED NOT DETECTED Final   Influenza B NOT DETECTED NOT DETECTED Final   Parainfluenza Virus 1 NOT DETECTED NOT DETECTED Final   Parainfluenza Virus 2 NOT DETECTED NOT DETECTED Final   Parainfluenza Virus 3 NOT DETECTED NOT DETECTED Final   Parainfluenza Virus 4 NOT DETECTED NOT  DETECTED Final   Respiratory Syncytial Virus NOT DETECTED NOT DETECTED Final   Bordetella pertussis NOT DETECTED NOT DETECTED Final   Chlamydophila pneumoniae NOT DETECTED NOT DETECTED Final   Mycoplasma pneumoniae NOT DETECTED NOT DETECTED Final    Comment: Performed at [facility], [place] [address] [place], [place] [address]    Radiology Studies: No results found.   Scheduled Meds: . aspirin  81 mg Oral Daily  . atorvastatin  40 mg Oral q1800  . clopidogrel  75 mg Oral Daily  . heparin subcutaneous  5,000 Units Subcutaneous Q8H  . isosorbide mononitrate  30 mg Oral Daily  . levalbuterol  1.25 mg Nebulization TID  . methylPREDNISolone (SOLU-MEDROL) injection  40 mg Intravenous Q12H  . metoprolol succinate  25 mg Oral BID  . pantoprazole  40 mg Oral Daily  . QUEtiapine  12.5 mg Oral QHS  . sodium chloride flush  3 mL Intravenous Q12H   Continuous Infusions: . sodium chloride Stopped (08/11/17 1600)    LOS: 4 days   Kerney Elbe, DO [facility] Pager [phone]  If 7PM-7AM, please contact night-coverage [email or web address] Password [email or web address] 08/13/2017, 5:46 PM

## 2017-08-13 NOTE — Care Management Important Message (Signed)
Important Message  Patient Details  Name: Milagros LollJohn Bucy MRN: [id number] Date of Birth: 09/08/1941   Medicare Important Message Given:  Yes    Calvin Chura Abena 08/13/2017, 10:02 AM

## 2017-08-14 DIAGNOSIS — A419 Sepsis, unspecified organism: Principal | ICD-10-CM

## 2017-08-14 LAB — COMPREHENSIVE METABOLIC PANEL
ALBUMIN: 3.3 g/dL — AB (ref 3.5–5.0)
ALK PHOS: 38 U/L (ref 38–126)
ALT: 34 U/L (ref 17–63)
AST: 35 U/L (ref 15–41)
Anion gap: 12 (ref 5–15)
BILIRUBIN TOTAL: 0.9 mg/dL (ref 0.3–1.2)
BUN: 27 mg/dL — AB (ref 6–20)
CO2: 22 mmol/L (ref 22–32)
Calcium: 8.2 mg/dL — ABNORMAL LOW (ref 8.9–10.3)
Chloride: 100 mmol/L — ABNORMAL LOW (ref 101–111)
Creatinine, Ser: 1.34 mg/dL — ABNORMAL HIGH (ref 0.61–1.24)
GFR calc Af Amer: 57 mL/min — ABNORMAL LOW (ref >60)
GFR calc non Af Amer: 49 mL/min — ABNORMAL LOW (ref >60)
GLUCOSE: 150 mg/dL — AB (ref 65–99)
Potassium: 4.3 mmol/L (ref 3.5–5.1)
Sodium: 134 mmol/L — ABNORMAL LOW (ref 135–145)
Total Protein: 6.7 g/dL (ref 6.5–8.1)

## 2017-08-14 LAB — CBC WITH DIFFERENTIAL/PLATELET
BASOS ABS: 0 10*3/uL (ref 0.0–0.1)
BASOS PCT: 0 %
Eosinophils Absolute: 0 10*3/uL (ref 0.0–0.7)
Eosinophils Relative: 0 %
HEMATOCRIT: 34 % — AB (ref 39.0–52.0)
HEMOGLOBIN: 11.4 g/dL — AB (ref 13.0–17.0)
LYMPHS PCT: 7 %
Lymphs Abs: 0.8 10*3/uL (ref 0.7–4.0)
MCH: 32.3 pg (ref 26.0–34.0)
MCHC: 33.5 g/dL (ref 30.0–36.0)
MCV: 96.3 fL (ref 78.0–100.0)
MONOS PCT: 2 %
Monocytes Absolute: 0.2 10*3/uL (ref 0.1–1.0)
NEUTROS ABS: 10.2 10*3/uL — AB (ref 1.7–7.7)
NEUTROS PCT: 91 %
Platelets: 177 10*3/uL (ref 150–400)
RBC: 3.53 MIL/uL — ABNORMAL LOW (ref 4.22–5.81)
RDW: 13.4 % (ref 11.5–15.5)
WBC: 11.2 10*3/uL — ABNORMAL HIGH (ref 4.0–10.5)

## 2017-08-14 LAB — PHOSPHORUS: Phosphorus: 3.9 mg/dL (ref 2.5–4.6)

## 2017-08-14 LAB — MAGNESIUM: MAGNESIUM: 2.3 mg/dL (ref 1.7–2.4)

## 2017-08-14 LAB — CULTURE, BLOOD (ROUTINE X 2)
Culture: NO GROWTH
Culture: NO GROWTH
Special Requests: ADEQUATE

## 2017-08-14 MED ORDER — LEVALBUTEROL HCL 1.25 MG/0.5ML IN NEBU: 1.2500 mg | INHALATION_SOLUTION | Freq: Three times a day (TID) | RESPIRATORY_TRACT | 12 refills | Status: AC

## 2017-08-14 MED ORDER — NITROGLYCERIN 0.4 MG SL SUBL: 0.4000 mg | SUBLINGUAL_TABLET | SUBLINGUAL | 12 refills | Status: AC | PRN

## 2017-08-14 MED ORDER — GI COCKTAIL ~~LOC~~: 30.0000 mL | Freq: Three times a day (TID) | ORAL | 0 refills | Status: AC | PRN

## 2017-08-14 MED ORDER — METOPROLOL SUCCINATE ER 25 MG PO TB24: 25.0000 mg | ORAL_TABLET | Freq: Two times a day (BID) | ORAL | 0 refills | Status: AC

## 2017-08-14 MED ORDER — QUETIAPINE FUMARATE 25 MG PO TABS: 12.5000 mg | ORAL_TABLET | Freq: Every day | ORAL | 0 refills | Status: AC

## 2017-08-14 MED ORDER — DM-GUAIFENESIN ER 30-600 MG PO TB12: 1.0000 | ORAL_TABLET | Freq: Two times a day (BID) | ORAL | 0 refills | Status: AC | PRN

## 2017-08-14 MED ORDER — PREDNISONE 10 MG (21) PO TBPK: ORAL_TABLET | ORAL | 0 refills | Status: AC

## 2017-08-14 MED ORDER — PANTOPRAZOLE SODIUM 40 MG PO TBEC: 40.0000 mg | DELAYED_RELEASE_TABLET | Freq: Every day | ORAL | 0 refills | Status: AC

## 2017-08-14 MED ORDER — ISOSORBIDE MONONITRATE ER 30 MG PO TB24: 30.0000 mg | ORAL_TABLET | Freq: Every day | ORAL | 0 refills | Status: AC

## 2017-08-14 MED ORDER — ASPIRIN 81 MG PO CHEW: 81.0000 mg | CHEWABLE_TABLET | Freq: Every day | ORAL | 0 refills | Status: AC

## 2017-08-14 MED ORDER — CLOPIDOGREL BISULFATE 75 MG PO TABS: 75.0000 mg | ORAL_TABLET | Freq: Every day | ORAL | 0 refills | Status: AC

## 2017-08-14 MED ORDER — ATORVASTATIN CALCIUM 40 MG PO TABS: 40.0000 mg | ORAL_TABLET | Freq: Every day | ORAL | 0 refills | Status: AC

## 2017-08-14 NOTE — Progress Notes (Signed)
Clinical Social Worker was contacted by the weekend admission coordinator from [facility]). Neysa BonitoChristy stated that unfortunately the facility was unable to get his room ready today so patient will be [facility]. Patient RN and MD have been updated.Facility stated they will take patient back in the morning   Marrianne MoodAshley Tawan Corkern, MSW,  [place]CSWA [phone]

## 2017-08-14 NOTE — Discharge Summary (Signed)
Physician Discharge Summary  Jacob Barajas RAX:[id number] DOB: 09-30-1941 DOA: 08/09/2017  PCP: Patient, No Pcp Per  Admit date: 08/09/2017 Discharge date: 08/14/2017  Admitted From: Home Disposition: SNF  Recommendations for Outpatient Follow-up:  1. Follow up with PCP in 1-2 weeks 2. Follow up with Cardiology as an outpatient with 1 week and re-evaluate starting Lasix as it was stopped on D/C because of worsening renal function 3. Palliative Care Medicine to follow at Rehab 4. Please obtain CMP/CBC, Mag, Phos in one week 5. Please follow up on the following pending results:  Home Health: No Equipment/Devices: None recommended by PT    Discharge Condition: Stable  CODE STATUS: FULL CODE Diet recommendation: Heart Healthy Diet  Brief/Interim Summary: 76 y.o.WM PMHxGout, Dementia, Chronic Diastolic CHF (by 2d echo on 02/04/16, who presented with shortness breath, Hypotension and fever.   Per EMS report, they were called out secondary to patient nausea and vomiting. Patient was found to have fever of 101.8, hypotension with blood pressure 80/40, tachycardia with heart rate up to 130, all seemed desaturation to 80% on room air. After his received approximately 2 L normal saline bolus in ED, his respiratory distress has suddenly worsened. His respiratory rate is up to 40s, oxygen desaturated to 80s. He has diffuse rhonchi and crackles on auscultation. 40 mg of Lasix was ordered for suspected fluid overload. BiPAP was started. Patient's oxygen saturation is back to 97% and respiratory rate decreased to 20s with BiPAP. Cardiology was consulted by EDP.  Patient was found to have an NSTEMI and underwent Cardiac Catheterization which revealed severe 3 vessel disease. Unfortunately patient was not a candidate for CABG so he was treated medically. Palliative Care consulted and patient met with them and had a phone conversation with Son. PT evaluated and recommending SNF and plan is for patient to  go to SNF with Palliative Care to Follow. Discussed with Social work and patient has a bed at SNF so will be discharged today as he is deemed medically stable and improved.   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, and the pt is not a candidate for CABG per TCTS -Lesions are too diffuse to be amenable to PTCA/stenting  -C/w Medical Management and Palliative Care -C/w ASA 81 mg po Daily, Atorvastatin 40 mg po Daily, Clopidpogrel 75 mg po Daily, Metoprolol XL 25 mg po BID, Isosorbide Mononitrate 30 mg po Daily, and NTG 0.4 mg SL q60mn prn CP; No longer anticoagulated with Heparin gtt -Per Cards If BP tolerates Beta-Blocker may have room to add low-dose ACE-I/ARB -Currently Asymptomatic and Cardiology Signed off -Palliative Care meeting with the Patient and is DNR -Palliative Care to follow at SNF f  Acute Combined Systolic and Diastolic CHF - Cardiogenic shock - Lactic acidosis -TTE noted EF 25% w/ grade 1 DD -C/w Metoprolol XL 25 mg po BID,  -Held Furosemide 40 mg po Daily given worsening Kidney Fxn and will defer to outpatient Cardiology/PCP to reinitiate  -Per Cards If BP tolerates Beta-Blocker may have room to add  ACE-I/ARB -Strict I's/O's, Daily Weights, Patient is -1,296.4 mL -Transfuse for hemoglobin<8 -10/24 PT/OT consult placed multivessel cardiac disease, S/P NSTEMI evaluate for CIR vs SNF vs outpatient cardiac rehabilitation;  -PT recommending SNF  Acute respiratory failure with hypoxia and hypercapnia/Positive  Rhinovirus CAP, improved  -Abx were stopped 10/22 due to Viral PNA -Blood Cx showed NGTD at 5 days  -Titrate O2 to maintain SPO2>93% -Xopenex QID changed to TID -WBC went from 15.3 -> 12.8 -> 12.9 -> 11.2 -C/w Flutter valve and Mucinex-DM -IV Solu-Medrol 60 mg daily was changed to po Prednisone 40 mg po daily 10/26, but patient still had some wheezing so was given IV 40 Solumedrol BID yesterday and will change to po Prednisone Steroid Taper   Lactic Acidosis  -Due to Cardiogenic Shock -Patient was on broad-spectrum antibiotics. Monitor for signs of infection. -TMax was 98.2 and WBC Trending down from 21.1 -> 15.3 -> 12.8 -> 11.2  Acute Renal Failure/AKI -Due to cardiogenic shock v/s contrast nephropathy  -Follow Cr w/ diuresis  -BUN/Cr went from 23/1.66 -> 23/1.28 -> 20/1.13 -> 23/1.31 -> 27/1.34 -Held Lasix po 40 mg today and yesterday; Defer to Cardiology as an outpatient to restart it -Repeat CMP as an outpatient at SNF  Acute Delirium/Dementia -Family stated patient has episodes of decreased cognition sundowning's vs baseline dementia? -Ammonia was slightly elevated at 38, B12 was 332 and Folate was 14.2 -Patient was agitated yesterday and removed IV and will attempt to reinsert -C/w Quetiapine 12.5 mg po qHS -C/w Delirium Precautions   Nausea / Vomiting and abdominal distention -CT abdomen unrevealing and showed no acute abdominal or pelvic process -Symptoms appear to have resolved  -Lipase was normal at 35  Normocytic Anemia -Hb/Hct went from 13.0/39.3 -> 11.7/35.4 -> 11.8/35.5 -> 11.5/34.2 -> 11.4/34.0 -Anemia Panel showed Iron level of 27, UIBC 166, TIBC 193, Saturation Ratios, Ferritin 325, Folate 14.2, B12 was 332 -Can have Iron Supplementation started as an outpatient  -FOBT Pending but can be obtained at SNF -Repeat CBC with Diff as an outpatient   Gout -Stable  GERD/Heartburn -C/w GI Cocktail 30 mL TIDprn -C/w Pantoprazole 40 mg po Daily    HLD -Lipid Panel Showed Cholesterol of 146, HDL of 41, LDL of 80, TG of 125, VLDL of 25 -C/w Atorvastatin 40 mg po Daily  Abnormal AST -Trending Down as AST went from 207 -> 139 -> 52 -> 35 -Likely in the setting of NSTEMI -Repeat CMP as an outpatient   Discharge Instructions  Discharge Instructions    (HEART FAILURE PATIENTS) Call MD:  Anytime you have any of the following symptoms: 1) 3 pound weight gain in 24 hours or 5 pounds in 1 week 2) shortness of breath, with or without a dry hacking cough 3) swelling in the hands, feet or stomach 4) if you have to sleep on extra pillows at night in order to breathe.    Complete by:  As directed    Call MD for:  difficulty breathing, headache or visual disturbances    Complete by:  As directed    Call MD for:  extreme fatigue    Complete by:  As directed    Call MD for:  hives    Complete by:  As directed    Call MD for:  persistant dizziness or light-headedness    Complete by:  As directed    Call MD for:  persistant nausea and vomiting    Complete by:  As directed    Call MD  for:  redness, tenderness, or signs of infection (pain, swelling, redness, odor or green/yellow discharge around incision site)    Complete by:  As directed    Call MD for:  severe uncontrolled pain    Complete by:  As directed    Call MD for:  temperature >100.4    Complete by:  As directed    Diet - low sodium heart healthy    Complete by:  As directed    Discharge instructions    Complete by:  As directed    Follow up with PCP and with Cardiology within 1 week. Palliative Care to follow at SNF. Will need re-evaluation for resuming po 40 mg Lasix as it was stopped because of AKI. Take all medications as prescribed. If symptoms change or worsen please return to the ED for Evaluation.   Increase activity slowly    Complete by:  As directed      Allergies as of 08/14/2017   No Known Allergies     Medication List    TAKE these medications    acetaminophen 500 MG tablet Commonly known as:  TYLENOL Take 500 mg by mouth every 6 (six) hours as needed for mild pain or moderate pain.   aspirin 81 MG chewable tablet Chew 1 tablet (81 mg total) by mouth daily.   atorvastatin 40 MG tablet Commonly known as:  LIPITOR Take 1 tablet (40 mg total) by mouth daily at 6 PM.   clopidogrel 75 MG tablet Commonly known as:  PLAVIX Take 1 tablet (75 mg total) by mouth daily.   dextromethorphan-guaiFENesin 30-600 MG 12hr tablet Commonly known as:  MUCINEX DM Take 1 tablet by mouth 2 (two) times daily as needed for cough.   gi cocktail Susp suspension Take 30 mLs by mouth 3 (three) times daily as needed for indigestion. Shake well.   isosorbide mononitrate 30 MG 24 hr tablet Commonly known as:  IMDUR Take 1 tablet (30 mg total) by mouth daily.   levalbuterol 1.25 MG/0.5ML nebulizer solution Commonly known as:  XOPENEX Take 1.25 mg by nebulization 3 (three) times daily.   metoprolol succinate 25 MG 24 hr tablet Commonly known as:  TOPROL-XL Take 1 tablet (25 mg total) by mouth 2 (two) times daily.   nitroGLYCERIN 0.4 MG SL tablet Commonly known as:  NITROSTAT Place 1 tablet (0.4 mg total) under the tongue every 5 (five) minutes as needed for chest pain.   pantoprazole 40 MG tablet Commonly known as:  PROTONIX Take 1 tablet (40 mg total) by mouth daily.   predniSONE 10 MG (21) Tbpk tablet Commonly known as:  STERAPRED UNI-PAK 21 TAB Take 6 tablets Day 1, 5 tablets Day 2, 4 tablets Day 3, 3 tablets Day 4, 2 tablets Day 5, 1 tablet Day 6 and Stop Day 7   QUEtiapine 25 MG tablet Commonly known as:  SEROQUEL Take 0.5 tablets (12.5 mg total) by mouth at bedtime.       Contact information for follow-up providers    Skeet Latch, MD Follow up.   Specialty:  Cardiology Why:  Follow up within 1 week for NSTEMI, Heart Failure, and Hospital Follow Up. Contact information: [place]  [address] [phone]            Contact information for after-discharge care    Destination    [facility] Follow up.   Specialty:  [place] information: [address] [place] [phone]  No Known Allergies  Consultations:  Cardiology  Cardiothoracic Surgery   Palliative Care Medicine  Procedures/Studies: Ct Abdomen Pelvis Wo Contrast  Result Date: 08/10/2017 CLINICAL DATA:  76 y/o M; nausea, vomiting, and abdominal distention. EXAM: CT ABDOMEN AND PELVIS WITHOUT CONTRAST TECHNIQUE: Multidetector CT imaging of the abdomen and pelvis was performed following the standard protocol without IV contrast. COMPARISON:  None. FINDINGS: Lower chest: Small bilateral pleural effusions. Severe coronary artery calcification. Mild diffuse peribronchial thickening. Hepatobiliary: Nonspecific punctate calcification in right lobe of liver likely representing sequelae of granulomatous disease. No other focal liver abnormality is seen. No gallstones, gallbladder wall thickening, or biliary dilatation. Pancreas: Unremarkable. No pancreatic ductal dilatation or surrounding inflammatory changes. Spleen: Normal in size without focal abnormality. Adrenals/Urinary Tract: Adrenal glands are unremarkable. Kidneys are normal, without renal calculi, focal lesion, or hydronephrosis. Bladder is unremarkable. Stomach/Bowel: Stomach is within normal limits. Appendix not identified, no pericecal inflammatory changes. No evidence of bowel wall thickening, distention, or inflammatory changes. No acute process identified. Vascular/Lymphatic: Aortic atherosclerosis. No enlarged abdominal or pelvic lymph nodes. Reproductive: Prostate is unremarkable. Other: No abdominal wall hernia or abnormality. No abdominopelvic ascites. Musculoskeletal: Advanced multilevel degenerative changes of the lumbar spine with multiple levels of canal  stenosis greatest at the L4-5 level where it is moderate to severe. No acute fracture identified. IMPRESSION: 1. No acute abdominal or pelvic process identified. 2. Small bilateral pleural effusions. Bronchitic changes in the lung bases. 3. Severe coronary artery and aortic calcific atherosclerosis. 4. Advanced lumbar degenerative changes with multilevel canal stenosis. Electronically Signed   By: Kristine Garbe M.D.   On: 08/10/2017 03:33   Dg Chest Port 1 View  Result Date: 08/09/2017 CLINICAL DATA:  Respiratory failure, sepsis, CHF and myocardial infarction. EXAM: PORTABLE CHEST 1 VIEW COMPARISON:  Prior study at 0230 hours FINDINGS: The heart size and mediastinal contours are within normal limits. Lungs show persistent mild bronchial and interstitial prominence bilaterally. There may be a component of mild interstitial edema as well as acute bronchitis. No significant focal airspace consolidation or pleural effusions are identified. No evidence of pneumothorax. The visualized skeletal structures are unremarkable. IMPRESSION: Stable mild bronchial and interstitial prominence with potential component of mild interstitial edema as well as acute bronchitis. Electronically Signed   By: Aletta Edouard M.D.   On: 08/09/2017 08:25   Dg Chest Port 1 View  Result Date: 08/09/2017 CLINICAL DATA:  Fever and cough, ex-smoker. EXAM: PORTABLE CHEST 1 VIEW COMPARISON:  05/04/2016 FINDINGS: The heart size and mediastinal contours are within normal limits. Nonspecific mild diffuse interstitial prominence is noted which may represent mild interstitial edema or bronchitic change some considerations. No significant effusion or pneumothorax. No confluent airspace disease. No acute nor suspicious osseous abnormality. IMPRESSION: Nonspecific mild diffuse interstitial prominence with differential possibilities that may include bronchitic change or mild interstitial edema. Given history of fever and cough, favor  bronchitic change. Electronically Signed   By: Ashley Royalty M.D.   On: 08/09/2017 03:14   ECHOCARDIOGRAM Study Conclusions  - Left ventricle: The cavity size was normal. Wall thickness was increased in a pattern of mild LVH. Mid to apical anteroseptal akinesis. Akinesis of the true apex. Inferoseptal, inferior, anterolateral, and anterior severe hypokinesis. The estimated ejection fraction was 25%. Doppler parameters are consistent with abnormal left ventricular relaxation (grade 1 diastolic dysfunction). - Aortic valve: Trileaflet; moderately calcified leaflets. Sclerosis without stenosis. - Mitral valve: There was moderate regurgitation. - Left atrium: The atrium was mildly dilated. - Right ventricle: The  cavity size was normal. Systolic function was mildly reduced. - Pulmonary arteries: No complete TR doppler jet so unable to estimate PA systolic pressure. - Systemic veins: The IVC measured 2.1 cm with > 50% respirophasic variation, suggesting RA pressure 8 mmHg.  LEFT HEART CATHETERIZATION  Prox RCA to Mid RCA lesion, 100 %stenosed.  Mid RCA to Dist RCA lesion, 95 %stenosed.  Dist RCA lesion, 95 %stenosed.  Ost LM lesion, 95 %stenosed.  Prox LAD lesion, 80 %stenosed.  Ost 1st Diag to 1st Diag lesion, 80 %stenosed.  Mid LAD lesion, 100 %stenosed.  Ost Cx lesion, 95 %stenosed.  Ost Cx to Prox Cx lesion, 80 %stenosed.  LM lesion, 95 %stenosed.  Subjective: Seen and examined at bedside and had no complaints. No CP or SOB. States heartburn is improved. No other concerns or complaints and ready to go to SNF.  Discharge Exam: Vitals:   08/14/17 1004 08/14/17 1206  BP:  113/87  Pulse:  84  Resp:  (!) 24  Temp:  98 F (36.7 C)  SpO2: 97% 95%   Vitals:   08/14/17 0445 08/14/17 0900 08/14/17 1004 08/14/17 1206  BP: 122/86 106/83  113/87  Pulse: 94 82  84  Resp: (!) 27 18  (!) 24  Temp: 97.6 F (36.4 C) (!) 97.5 F (36.4 C)  98 F (36.7 C)   TempSrc: Oral Axillary  Oral  SpO2: 96% 94% 97% 95%  Weight:      Height:       General: Pt is alert, awake, not in acute distress Cardiovascular: RRR, S1/S2 +, no rubs, no gallops Respiratory: Diminished bilaterally but no wheezing, no rhonchi; Patient was not tachypenic or using any accessory muscles to breathe.  Abdominal: Soft, NT, ND, bowel sounds + Extremities: no edema and appears euvolemic, no cyanosis  The results of significant diagnostics from this hospitalization (including imaging, microbiology, ancillary and laboratory) are listed below for reference.    Microbiology: Recent Results (from the past 240 hour(s))  Blood Culture (routine x 2)     Status: None   Collection Time: 08/09/17  2:34 AM  Result Value Ref Range Status   Specimen Description RIGHT ANTECUBITAL  Final   Special Requests   Final    BOTTLES DRAWN AEROBIC AND ANAEROBIC Blood Culture adequate volume   Culture   Final    NO GROWTH 5 DAYS Performed at [facility], [address], [place], [place] [address]    Report Status 08/14/2017 FINAL  Final  Blood Culture (routine x 2)     Status: None   Collection Time: 08/09/17  3:10 AM  Result Value Ref Range Status   Specimen Description BLOOD RIGHT FOREARM  Final   Special Requests BOTTLES DRAWN AEROBIC AND ANAEROBIC BCAV  Final   Culture   Final    NO GROWTH 5 DAYS Performed at [facility], [place] [address], [place], [place] [address]    Report Status 08/14/2017 FINAL  Final  MRSA PCR Screening     Status: None   Collection Time: 08/09/17  6:39 AM  Result Value Ref Range Status   MRSA by PCR NEGATIVE NEGATIVE Final    Comment:        The GeneXpert MRSA Assay (FDA approved for NASAL specimens only), is one component of a comprehensive MRSA colonization surveillance program. It is not intended to diagnose MRSA infection nor to guide or monitor treatment for MRSA infections.   Urine culture     Status:  None   Collection Time:  08/09/17  7:00 AM  Result Value Ref Range Status   Specimen Description URINE, CLEAN CATCH  Final   Special Requests NONE  Final   Culture   Final    NO GROWTH Performed at [facility], [address] [place], [place] [address]    Report Status 08/10/2017 FINAL  Final  Respiratory Panel by PCR     Status: Abnormal   Collection Time: 08/09/17  7:00 AM  Result Value Ref Range Status   Adenovirus NOT DETECTED NOT DETECTED Final   Coronavirus 229E NOT DETECTED NOT DETECTED Final   Coronavirus HKU1 NOT DETECTED NOT DETECTED Final   Coronavirus NL63 NOT DETECTED NOT DETECTED Final   Coronavirus OC43 NOT DETECTED NOT DETECTED Final   Metapneumovirus NOT DETECTED NOT DETECTED Final   Rhinovirus / Enterovirus DETECTED (A) NOT DETECTED Final   Influenza A NOT DETECTED NOT DETECTED Final   Influenza B NOT DETECTED NOT DETECTED Final   Parainfluenza Virus 1 NOT DETECTED NOT DETECTED Final   Parainfluenza Virus 2 NOT DETECTED NOT DETECTED Final   Parainfluenza Virus 3 NOT DETECTED NOT DETECTED Final   Parainfluenza Virus 4 NOT DETECTED NOT DETECTED Final   Respiratory Syncytial Virus NOT DETECTED NOT DETECTED Final   Bordetella pertussis NOT DETECTED NOT DETECTED Final   Chlamydophila pneumoniae NOT DETECTED NOT DETECTED Final   Mycoplasma pneumoniae NOT DETECTED NOT DETECTED Final    Comment: Performed at [facility], [place] [address] [place],  [address]    Labs: BNP (last 3 results)  Recent Labs  08/09/17 0448  BNP 793.9*   Basic Metabolic Panel:  Recent Labs Lab 08/10/17 0411 08/11/17 0443 08/12/17 0353 08/13/17 0324 08/14/17 0329  NA 134* 135 136 136 134*  K 4.3 3.9 4.1 3.6 4.3  CL 98* 98* 100* 100* 100*  CO2 27 25 25 27 22   GLUCOSE 147* 107* 149* 95 150*  BUN 23* 23* 20 23* 27*  CREATININE 1.66* 1.28* 1.13 1.31* 1.34*  CALCIUM 8.2* 8.3* 8.4* 8.3* 8.2*  MG  --   --  2.3 2.2 2.3  PHOS  --   --   --  4.0 3.9   Liver Function Tests:  Recent  Labs Lab 08/09/17 0246 08/10/17 0411 08/11/17 0443 08/13/17 0324 08/14/17 0329  AST 95* 207* 139* 52* 35  ALT 22 43 39 33 34  ALKPHOS 47 37* 34* 36* 38  BILITOT 0.6 1.2 1.4* 1.2 0.9  PROT 7.7 6.6 6.7 6.8 6.7  ALBUMIN 3.9 3.4* 3.4* 3.3* 3.3*    Recent Labs Lab 08/09/17 0720  LIPASE 35    Recent Labs Lab 08/11/17 0443  AMMONIA 38*   CBC:  Recent Labs Lab 08/09/17 0246 08/10/17 0411 08/11/17 0443 08/12/17 0353 08/13/17 0324 08/14/17 0329  WBC 18.1* 21.1* 15.3* 12.8* 12.9* 11.2*  NEUTROABS 13.7*  --   --  11.3* 9.2* 10.2*  HGB 15.1 13.0 11.7* 11.8* 11.5* 11.4*  HCT 44.5 39.3 35.4* 35.5* 34.2* 34.0*  MCV 97.4 98.0 97.3 96.5 97.2 96.3  PLT 213 193 167 171 176 177   Cardiac Enzymes:  Recent Labs Lab 08/09/17 0426 08/09/17 0720 08/09/17 1025  TROPONINI 6.18* 15.25* 27.86*   BNP: Invalid input(s): POCBNP CBG: No results for input(s): GLUCAP in the last 168 hours. D-Dimer No results for input(s): DDIMER in the last 72 hours. Hgb A1c No results for input(s): HGBA1C in the last 72 hours. Lipid Profile No results for input(s):  CHOL, HDL, LDLCALC, TRIG, CHOLHDL, LDLDIRECT in the last 72 hours. Thyroid function studies No results for input(s): TSH, T4TOTAL, T3FREE, THYROIDAB in the last 72 hours.  Invalid input(s): FREET3 Anemia work up  Recent Labs  08/11/17 1435  VITAMINB12 332  FOLATE 14.2  FERRITIN 325  TIBC 193*  IRON 27*  RETICCTPCT 1.4   Urinalysis    Component Value Date/Time   COLORURINE YELLOW 08/09/2017 0700   APPEARANCEUR CLEAR 08/09/2017 0700   LABSPEC 1.013 08/09/2017 0700   PHURINE 5.0 08/09/2017 0700   GLUCOSEU NEGATIVE 08/09/2017 0700   HGBUR SMALL (A) 08/09/2017 0700   BILIRUBINUR NEGATIVE 08/09/2017 0700   KETONESUR NEGATIVE 08/09/2017 0700   PROTEINUR NEGATIVE 08/09/2017 0700   NITRITE NEGATIVE 08/09/2017 0700   LEUKOCYTESUR NEGATIVE 08/09/2017 0700   Sepsis Labs Invalid input(s): PROCALCITONIN,  WBC,   LACTICIDVEN Microbiology Recent Results (from the past 240 hour(s))  Blood Culture (routine x 2)     Status: None   Collection Time: 08/09/17  2:34 AM  Result Value Ref Range Status   Specimen Description RIGHT ANTECUBITAL  Final   Special Requests   Final    BOTTLES DRAWN AEROBIC AND ANAEROBIC Blood Culture adequate volume   Culture   Final    NO GROWTH 5 DAYS Performed at [facility], [address] [place], [place] [address]    Report Status 08/14/2017 FINAL  Final  Blood Culture (routine x 2)     Status: None   Collection Time: 08/09/17  3:10 AM  Result Value Ref Range Status   Specimen Description BLOOD RIGHT FOREARM  Final   Special Requests BOTTLES DRAWN AEROBIC AND ANAEROBIC BCAV  Final   Culture   Final    NO GROWTH 5 DAYS Performed at [facility], [place] [address] [place], [place] [address]    Report Status 08/14/2017 FINAL  Final  MRSA PCR Screening     Status: None   Collection Time: 08/09/17  6:39 AM  Result Value Ref Range Status   MRSA by PCR NEGATIVE NEGATIVE Final    Comment:        The GeneXpert MRSA Assay (FDA approved for NASAL specimens only), is one component of a comprehensive MRSA colonization surveillance program. It is not intended to diagnose MRSA infection nor to guide or monitor treatment for MRSA infections.   Urine culture     Status: None   Collection Time: 08/09/17  7:00 AM  Result Value Ref Range Status   Specimen Description URINE, CLEAN CATCH  Final   Special Requests NONE  Final   Culture   Final    NO GROWTH Performed at [facility], [address] [place], [place] [address]    Report Status 08/10/2017 FINAL  Final  Respiratory Panel by PCR     Status: Abnormal   Collection Time: 08/09/17  7:00 AM  Result Value Ref Range Status   Adenovirus NOT DETECTED NOT DETECTED Final   Coronavirus 229E NOT DETECTED NOT DETECTED Final   Coronavirus HKU1 NOT DETECTED NOT DETECTED Final   Coronavirus NL63 NOT DETECTED  NOT DETECTED Final   Coronavirus OC43 NOT DETECTED NOT DETECTED Final   Metapneumovirus NOT DETECTED NOT DETECTED Final   Rhinovirus / Enterovirus DETECTED (A) NOT DETECTED Final   Influenza A NOT DETECTED NOT DETECTED Final   Influenza B NOT DETECTED NOT DETECTED Final   Parainfluenza Virus 1 NOT DETECTED NOT DETECTED Final   Parainfluenza Virus 2 NOT DETECTED  NOT DETECTED Final   Parainfluenza Virus 3 NOT DETECTED NOT DETECTED Final   Parainfluenza Virus 4 NOT DETECTED NOT DETECTED Final   Respiratory Syncytial Virus NOT DETECTED NOT DETECTED Final   Bordetella pertussis NOT DETECTED NOT DETECTED Final   Chlamydophila pneumoniae NOT DETECTED NOT DETECTED Final   Mycoplasma pneumoniae NOT DETECTED NOT DETECTED Final    Comment: Performed at [facility], [place] [address] [place], [place] [address]   Time coordinating discharge: 35 minutes  SIGNED:  Kerney Elbe, DO [facility] 08/14/2017, 1:34 PM Pager [phone]  If 7PM-7AM, please contact night-coverage [email or web address] Password [email or web address]

## 2017-08-14 NOTE — Progress Notes (Signed)
Patient's sister [name] contacted [facility].  [facility] give [facility]. Voicemail option to leave message for Nurse Supervisor. Message left with request for call back.  Asher Muir- Floreine Kingdon,RN

## 2017-08-14 NOTE — Progress Notes (Signed)
Clinical Social Worker facilitated patient discharge including contacting patient family and facility to confirm patient discharge plans.  Clinical information faxed to facility and family agreeable with plan.  CSW arranged ambulance transport via [facility] to [facility] .  RN to call [phone] for report prior to discharge.  Clinical Social Worker will sign off for now as social work intervention is no longer needed. Please consult us again if new need arises.  Rockne Dearinger, MSW, LCSWA [phone]  

## 2017-08-15 DIAGNOSIS — I509 Heart failure, unspecified: Secondary | ICD-10-CM | POA: Diagnosis not present

## 2017-08-15 DIAGNOSIS — R6520 Severe sepsis without septic shock: Secondary | ICD-10-CM | POA: Diagnosis not present

## 2017-08-15 DIAGNOSIS — Z23 Encounter for immunization: Secondary | ICD-10-CM | POA: Diagnosis not present

## 2017-08-15 DIAGNOSIS — R278 Other lack of coordination: Secondary | ICD-10-CM | POA: Diagnosis not present

## 2017-08-15 DIAGNOSIS — Z5189 Encounter for other specified aftercare: Secondary | ICD-10-CM | POA: Diagnosis not present

## 2017-08-15 DIAGNOSIS — E871 Hypo-osmolality and hyponatremia: Secondary | ICD-10-CM

## 2017-08-15 DIAGNOSIS — M6281 Muscle weakness (generalized): Secondary | ICD-10-CM | POA: Diagnosis not present

## 2017-08-15 DIAGNOSIS — I214 Non-ST elevation (NSTEMI) myocardial infarction: Secondary | ICD-10-CM | POA: Diagnosis not present

## 2017-08-15 DIAGNOSIS — R488 Other symbolic dysfunctions: Secondary | ICD-10-CM | POA: Diagnosis not present

## 2017-08-15 DIAGNOSIS — J9600 Acute respiratory failure, unspecified whether with hypoxia or hypercapnia: Secondary | ICD-10-CM | POA: Diagnosis not present

## 2017-08-15 LAB — BASIC METABOLIC PANEL
Anion gap: 12 (ref 5–15)
BUN: 29 mg/dL — AB (ref 6–20)
CHLORIDE: 102 mmol/L (ref 101–111)
CO2: 23 mmol/L (ref 22–32)
CREATININE: 1.29 mg/dL — AB (ref 0.61–1.24)
Calcium: 8.4 mg/dL — ABNORMAL LOW (ref 8.9–10.3)
GFR calc Af Amer: >60 mL/min (ref >60)
GFR calc non Af Amer: 52 mL/min — ABNORMAL LOW (ref >60)
Glucose, Bld: 120 mg/dL — ABNORMAL HIGH (ref 65–99)
Potassium: 3.7 mmol/L (ref 3.5–5.1)
SODIUM: 137 mmol/L (ref 135–145)

## 2017-08-15 LAB — MAGNESIUM: MAGNESIUM: 2.6 mg/dL — AB (ref 1.7–2.4)

## 2017-08-15 MED ORDER — HALOPERIDOL LACTATE 5 MG/ML IJ SOLN
1.0000 mg | Freq: Once | INTRAMUSCULAR | Status: AC
Start: 2017-08-15 — End: 2017-08-15
  Administered 2017-08-15: 1 mg via INTRAVENOUS
  Filled 2017-08-15: qty 1

## 2017-08-15 NOTE — Progress Notes (Signed)
Clinical Social Worker facilitated patient discharge including contacting patient family and facility to confirm patient discharge plans.  Clinical information faxed to facility and family agreeable with plan.  CSW arranged [facility] transport [facility] .  RN to call [phone] (pt will go in room [phone]) for report prior to discharge.  Clinical Social Worker will sign off for now as social work intervention is no longer needed. Please consult us again if new need arises.  Marrianne MoodAshley Rangel Echeverri, MSW, [employer]CSWA [phone]

## 2017-08-15 NOTE — Discharge Summary (Signed)
Physician Discharge Summary  Unnamed Zeien WUJ:[id number] DOB: 01-09-1941 DOA: 08/09/2017  PCP: Patient, No Pcp Per  Admit date: 08/09/2017 Discharge date: 08/15/2017  Admitted From: Home Disposition: SNF  ADDENDUM: Patient was discharged to SNF yesterday but did not go because SNF bed was not cleaned. Patient doing well this AM and resting comfortably. Overnight he got confused and delirious attempting to get out of bed so Haldol was given. No complaints this AM and no CP or SOB. No other concerns or complaints and stable to D/C to SNF.  Recommendations for Outpatient Follow-up:  1. Follow up with PCP in 1-2 weeks 2. Follow up with Cardiology as an outpatient with 1 week and re-evaluate starting Lasix as it was stopped on D/C because of worsening renal function 3. Palliative Care Medicine to follow at Rehab 4. Please obtain CMP/CBC, Mag, Phos in one week 5. Please follow up on the following pending results:  Home Health: No Equipment/Devices: None recommended by PT    Discharge Condition: Stable  CODE STATUS: FULL CODE Diet recommendation: Heart Healthy Diet  Brief/Interim Summary: 76 y.o.WM PMHxGout, Dementia, Chronic Diastolic CHF (by 2d echo on 02/04/16, who presented with shortness breath, Hypotension and fever.   Per EMS report, they were called out secondary to patient nausea and vomiting. Patient was found to have fever of 101.8, hypotension with blood pressure 80/40, tachycardia with heart rate up to 130, all seemed desaturation to 80% on room air. After his received approximately 2 L normal saline bolus in ED, his respiratory distress has suddenly worsened. His respiratory rate is up to 40s, oxygen desaturated to 80s. He has diffuse rhonchi and crackles on auscultation. 40 mg of Lasix was ordered for suspected fluid overload. BiPAP was started. Patient's oxygen saturation is back to 97% and respiratory rate decreased to 20s with BiPAP. Cardiology was consulted by  EDP.  Patient was found to have an NSTEMI and underwent Cardiac Catheterization which revealed severe 3 vessel disease. Unfortunately patient was not a candidate for CABG so he was treated medically. Palliative Care consulted and patient met with them and had a phone conversation with Son. PT evaluated and recommending SNF and plan is for patient to go to SNF with Palliative Care to Follow. Discussed with Social work and patient has a bed at SNF so will be discharged today as he is deemed medically stable and improved.   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, and the pt is not a candidate for CABG per TCTS -Lesions are too diffuse to be amenable to PTCA/stenting  -C/w Medical Management and Palliative Care -C/w ASA 81 mg po Daily, Atorvastatin 40 mg po Daily, Clopidpogrel 75 mg po Daily, Metoprolol XL 25 mg po BID, Isosorbide Mononitrate 30 mg po Daily, and NTG 0.4 mg SL q66mn prn CP; No longer anticoagulated with Heparin gtt -Per Cards If BP tolerates Beta-Blocker may have room to add low-dose ACE-I/ARB -Currently Asymptomatic and Cardiology Signed off -Palliative Care meeting with the Patient and is DNR -Palliative  Care to follow at SNF   Acute Combined Systolic and Diastolic CHF - Cardiogenic shock - Lactic acidosis -TTE noted EF 25% w/ grade 1 DD -C/w Metoprolol XL 25 mg po BID,  -Held Furosemide 40 mg po Daily given worsening Kidney Fxn and will defer to outpatient Cardiology/PCP to reinitiate  -Per Cards  If BP tolerates Beta-Blocker may have room to add ACE-I/ARB -Strict I's/O's, Daily Weights, Patient is -1,296.4 mL -Transfuse for hemoglobin<8 -10/24 PT/OT consult placed multivessel cardiac disease, S/P NSTEMI evaluate for CIR vs SNF vs outpatient cardiac rehabilitation;  -PT recommending SNF  Acute respiratory failure with hypoxia and hypercapnia/Positive Rhinovirus CAP, improved  -Abx were stopped 10/22 due to Viral PNA -Blood Cx showed NGTD at 5 days  -Titrate O2 to maintain SPO2>93% -Xopenex QID changed to TID -WBC went from 15.3 -> 12.8 -> 12.9 -> 11.2 -C/w Flutter valve and Mucinex-DM -IV Solu-Medrol 60 mg daily was changed to po Prednisone 40 mg po daily 10/26, but patient still had some wheezing so was given IV 40 Solumedrol BID yesterday and will change to po Prednisone Steroid Taper   Lactic Acidosis  -Due to Cardiogenic Shock -Patient was on broad-spectrum antibiotics. Monitor for signs of infection. -TMax was 98.2 and WBC Trending down from 21.1 -> 15.3 -> 12.8 -> 11.2  Acute Renal Failure/AKI -Due to cardiogenic shock v/s contrast nephropathy  -Follow Cr w/ diuresis  -BUN/Cr went from 23/1.66 -> 23/1.28 -> 20/1.13 -> 23/1.31 -> 27/1.34 -> 27/1.29 -Held Lasix po 40 mg today and yesterday; Defer to Cardiology as an outpatient to restart it -Repeat CMP as an outpatient at SNF  Acute Delirium/Dementia -Family stated patient has episodes of decreased cognition sundowning's vs baseline dementia? -Ammonia was slightly elevated at 38, B12 was 332 and Folate was 14.2 -Patient was agitated yesterday and removed IV and will attempt to reinsert -C/w Quetiapine 12.5 mg po qHS; Received Haldol Last Night -C/w Delirium Precautions   Nausea / Vomiting and abdominal distention -CT abdomen unrevealing and showed no acute abdominal or pelvic process -Symptoms appear to have resolved  -Lipase was normal at 35  Normocytic Anemia -Hb/Hct went from 13.0/39.3 -> 11.7/35.4 ->  11.8/35.5 -> 11.5/34.2 -> 11.4/34.0 -Anemia Panel showed Iron level of 27, UIBC 166, TIBC 193, Saturation Ratios, Ferritin 325, Folate 14.2, B12 was 332 -Can have Iron Supplementation started as an outpatient  -FOBT Pending but can be obtained at SNF -Repeat CBC with Diff as an outpatient   Gout -Stable  GERD/Heartburn -C/w GI Cocktail 30 mL TIDprn -C/w Pantoprazole 40 mg po Daily   HLD -Lipid Panel Showed Cholesterol of 146, HDL of 41, LDL of 80, TG of 125, VLDL of 25 -C/w Atorvastatin 40 mg po Daily  Abnormal AST -Trending Down as AST went from 207 -> 139 -> 52 -> 35 -Likely in the setting of NSTEMI -Repeat CMP as an outpatient   Hyponatremia -Mild and now Improved. Sodium is 137 this AM -Repeat at SNF  Discharge Instructions  Discharge Instructions    (HEART FAILURE PATIENTS) Call MD:  Anytime you have any of the following symptoms: 1) 3 pound weight gain in 24 hours or 5 pounds in 1 week 2) shortness of breath, with or without a dry hacking cough 3) swelling in the hands, feet or stomach 4) if you have to sleep on extra pillows at night in order to breathe.    Complete by:  As directed    Call MD for:  difficulty breathing, headache or visual  disturbances    Complete by:  As directed    Call MD for:  extreme fatigue    Complete by:  As directed    Call MD for:  hives    Complete by:  As directed    Call MD for:  persistant dizziness or light-headedness    Complete by:  As directed    Call MD for:  persistant nausea and vomiting    Complete by:  As directed    Call MD for:  redness, tenderness, or signs of infection (pain, swelling, redness, odor or green/yellow discharge around incision site)    Complete by:  As directed    Call MD for:  severe uncontrolled pain    Complete by:  As directed    Call MD for:  temperature >100.4    Complete by:  As directed    Diet - low sodium heart healthy    Complete by:  As directed    Discharge instructions    Complete by:   As directed    Follow up with PCP and with Cardiology within 1 week. Palliative Care to follow at SNF. Will need re-evaluation for resuming po 40 mg Lasix as it was stopped because of AKI. Take all medications as prescribed. If symptoms change or worsen please return to the ED for Evaluation.   Increase activity slowly    Complete by:  As directed      Allergies as of 08/15/2017   No Known Allergies     Medication List    TAKE these medications   acetaminophen 500 MG tablet Commonly known as:  TYLENOL Take 500 mg by mouth every 6 (six) hours as needed for mild pain or moderate pain.   aspirin 81 MG chewable tablet Chew 1 tablet (81 mg total) by mouth daily.   atorvastatin 40 MG tablet Commonly known as:  LIPITOR Take 1 tablet (40 mg total) by mouth daily at 6 PM.   clopidogrel 75 MG tablet Commonly known as:  PLAVIX Take 1 tablet (75 mg total) by mouth daily.   dextromethorphan-guaiFENesin 30-600 MG 12hr tablet Commonly known as:  MUCINEX DM Take 1 tablet by mouth 2 (two) times daily as needed for cough.   gi cocktail Susp suspension Take 30 mLs by mouth 3 (three) times daily as needed for indigestion. Shake well.   isosorbide mononitrate 30 MG 24 hr tablet Commonly known as:  IMDUR Take 1 tablet (30 mg total) by mouth daily.   levalbuterol 1.25 MG/0.5ML nebulizer solution Commonly known as:  XOPENEX Take 1.25 mg by nebulization 3 (three) times daily.   metoprolol succinate 25 MG 24 hr tablet Commonly known as:  TOPROL-XL Take 1 tablet (25 mg total) by mouth 2 (two) times daily.   nitroGLYCERIN 0.4 MG SL tablet Commonly known as:  NITROSTAT Place 1 tablet (0.4 mg total) under the tongue every 5 (five) minutes as needed for chest pain.   pantoprazole 40 MG tablet Commonly known as:  PROTONIX Take 1 tablet (40 mg total) by mouth daily.   predniSONE 10 MG (21) Tbpk tablet Commonly known as:  STERAPRED UNI-PAK 21 TAB Take 6 tablets Day 1, 5 tablets Day 2, 4  tablets Day 3, 3 tablets Day 4, 2 tablets Day 5, 1 tablet Day 6 and Stop Day 7   QUEtiapine 25 MG tablet Commonly known as:  SEROQUEL Take 0.5 tablets (12.5 mg total) by mouth at bedtime.       Contact information for follow-up providers  Skeet Latch, MD Follow up.   Specialty:  Cardiology Why:  Follow up within 1 week for NSTEMI, Heart Failure, and Hospital Follow Up. Contact information: [place] [address] [phone]            Contact information for after-discharge care    Destination    [facility] Follow up.   Specialty:  [place] information: [address] [place] [phone]                 No Known Allergies  Consultations:  Cardiology  Cardiothoracic Surgery   Palliative Care Medicine  Procedures/Studies: Ct Abdomen Pelvis Wo Contrast  Result Date: 08/10/2017 CLINICAL DATA:  76 y/o M; nausea, vomiting, and abdominal distention. EXAM: CT ABDOMEN AND PELVIS WITHOUT CONTRAST TECHNIQUE: Multidetector CT imaging of the abdomen and pelvis was performed following the standard protocol without IV contrast. COMPARISON:  None. FINDINGS: Lower chest: Small bilateral pleural effusions. Severe coronary artery calcification. Mild diffuse peribronchial thickening. Hepatobiliary: Nonspecific punctate calcification in right lobe of liver likely representing sequelae of granulomatous disease. No other focal liver abnormality is seen. No gallstones, gallbladder wall thickening, or biliary dilatation. Pancreas: Unremarkable. No pancreatic ductal dilatation or surrounding inflammatory changes. Spleen: Normal in size without focal abnormality. Adrenals/Urinary Tract: Adrenal glands are unremarkable. Kidneys are normal, without renal calculi, focal lesion, or hydronephrosis. Bladder is unremarkable. Stomach/Bowel: Stomach is within normal limits. Appendix not identified,  no pericecal inflammatory changes. No evidence of bowel wall thickening, distention, or inflammatory changes. No acute process identified. Vascular/Lymphatic: Aortic atherosclerosis. No enlarged abdominal or pelvic lymph nodes. Reproductive: Prostate is unremarkable. Other: No abdominal wall hernia or abnormality. No abdominopelvic ascites. Musculoskeletal: Advanced multilevel degenerative changes of the lumbar spine with multiple levels of canal stenosis greatest at the L4-5 level where it is moderate to severe. No acute fracture identified. IMPRESSION: 1. No acute abdominal or pelvic process identified. 2. Small bilateral pleural effusions. Bronchitic changes in the lung bases. 3. Severe coronary artery and aortic calcific atherosclerosis. 4. Advanced lumbar degenerative changes with multilevel canal stenosis. Electronically Signed   By: Kristine Garbe M.D.   On: 08/10/2017 03:33   Dg Chest Port 1 View  Result Date: 08/09/2017 CLINICAL DATA:  Respiratory failure, sepsis, CHF and myocardial infarction. EXAM: PORTABLE CHEST 1 VIEW COMPARISON:  Prior study at 0230 hours FINDINGS: The heart size and mediastinal contours are within normal limits. Lungs show persistent mild bronchial and interstitial prominence bilaterally. There may be a component of mild interstitial edema as well as acute bronchitis. No significant focal airspace consolidation or pleural effusions are identified. No evidence of pneumothorax. The visualized skeletal structures are unremarkable. IMPRESSION: Stable mild bronchial and interstitial prominence with potential component of mild interstitial edema as well as acute bronchitis. Electronically Signed   By: Aletta Edouard M.D.   On: 08/09/2017 08:25   Dg Chest Port 1 View  Result Date: 08/09/2017 CLINICAL DATA:  Fever and cough, ex-smoker. EXAM: PORTABLE CHEST 1 VIEW COMPARISON:  05/04/2016 FINDINGS: The heart size and mediastinal contours are within normal limits.  Nonspecific mild diffuse interstitial prominence is noted which may represent mild interstitial edema or bronchitic change some considerations. No significant effusion or pneumothorax. No confluent airspace disease. No acute nor suspicious osseous abnormality. IMPRESSION: Nonspecific mild diffuse interstitial prominence with differential possibilities that may include bronchitic change or mild interstitial edema. Given history of fever and cough, favor bronchitic change. Electronically Signed   By:  Ashley Royalty M.D.   On: 08/09/2017 03:14   ECHOCARDIOGRAM Study Conclusions  - Left ventricle: The cavity size was normal. Wall thickness was increased in a pattern of mild LVH. Mid to apical anteroseptal akinesis. Akinesis of the true apex. Inferoseptal, inferior, anterolateral, and anterior severe hypokinesis. The estimated ejection fraction was 25%. Doppler parameters are consistent with abnormal left ventricular relaxation (grade 1 diastolic dysfunction). - Aortic valve: Trileaflet; moderately calcified leaflets. Sclerosis without stenosis. - Mitral valve: There was moderate regurgitation. - Left atrium: The atrium was mildly dilated. - Right ventricle: The cavity size was normal. Systolic function was mildly reduced. - Pulmonary arteries: No complete TR doppler jet so unable to estimate PA systolic pressure. - Systemic veins: The IVC measured 2.1 cm with > 50% respirophasic variation, suggesting RA pressure 8 mmHg.  LEFT HEART CATHETERIZATION  Prox RCA to Mid RCA lesion, 100 %stenosed.  Mid RCA to Dist RCA lesion, 95 %stenosed.  Dist RCA lesion, 95 %stenosed.  Ost LM lesion, 95 %stenosed.  Prox LAD lesion, 80 %stenosed.  Ost 1st Diag to 1st Diag lesion, 80 %stenosed.  Mid LAD lesion, 100 %stenosed.  Ost Cx lesion, 95 %stenosed.  Ost Cx to Prox Cx lesion, 80 %stenosed.  LM lesion, 95 %stenosed.  Subjective: Seen and examined at bedside and had no  complaints. No CP or SOB. States heartburn is improved. No other concerns or complaints and ready to go to SNF.  Discharge Exam: Vitals:   08/15/17 0505 08/15/17 0918  BP:    Pulse: 80 74  Resp: (!) 21 20  Temp: 97.6 F (36.4 C)   SpO2: 99% 96%   Vitals:   08/14/17 1932 08/14/17 2014 08/15/17 0505 08/15/17 0918  BP:  116/78    Pulse:   80 74  Resp:  (!) 24 (!) 21 20  Temp:  (!) 97.4 F (36.3 C) 97.6 F (36.4 C)   TempSrc:  Oral Oral   SpO2: 96% 97% 99% 96%  Weight:      Height:       General: Pt is alert, awake, not in acute distress Cardiovascular: RRR, S1/S2 +, no rubs, no gallops Respiratory: Diminished bilaterally with mild wheezing today, no rhonchi; Patient was not tachypenic or using any accessory muscles to breathe.  Abdominal: Soft, NT, ND, bowel sounds + Extremities: no edema and appears euvolemic, no cyanosis  The results of significant diagnostics from this hospitalization (including imaging, microbiology, ancillary and laboratory) are listed below for reference.    Microbiology: Recent Results (from the past 240 hour(s))  Blood Culture (routine x 2)     Status: None   Collection Time: 08/09/17  2:34 AM  Result Value Ref Range Status   Specimen Description RIGHT ANTECUBITAL  Final   Special Requests   Final    BOTTLES DRAWN AEROBIC AND ANAEROBIC Blood Culture adequate volume   Culture   Final    NO GROWTH 5 DAYS Performed at [facility], [address] [place], [place] [address]    Report Status 08/14/2017 FINAL  Final  Blood Culture (routine x 2)     Status: None   Collection Time: 08/09/17  3:10 AM  Result Value Ref Range Status   Specimen Description BLOOD RIGHT FOREARM  Final   Special Requests BOTTLES DRAWN AEROBIC AND ANAEROBIC BCAV  Final   Culture   Final    NO GROWTH 5 DAYS Performed at [facility], [place] [address] [place], [place] [address]  Report Status 08/14/2017 FINAL  Final  MRSA PCR Screening     Status: None    Collection Time: 08/09/17  6:39 AM  Result Value Ref Range Status   MRSA by PCR NEGATIVE NEGATIVE Final    Comment:        The GeneXpert MRSA Assay (FDA approved for NASAL specimens only), is one component of a comprehensive MRSA colonization surveillance program. It is not intended to diagnose MRSA infection nor to guide or monitor treatment for MRSA infections.   Urine culture     Status: None   Collection Time: 08/09/17  7:00 AM  Result Value Ref Range Status   Specimen Description URINE, CLEAN CATCH  Final   Special Requests NONE  Final   Culture   Final    NO GROWTH Performed at [facility], [address] [place], [place] [address]    Report Status 08/10/2017 FINAL  Final  Respiratory Panel by PCR     Status: Abnormal   Collection Time: 08/09/17  7:00 AM  Result Value Ref Range Status   Adenovirus NOT DETECTED NOT DETECTED Final   Coronavirus 229E NOT DETECTED NOT DETECTED Final   Coronavirus HKU1 NOT DETECTED NOT DETECTED Final   Coronavirus NL63 NOT DETECTED NOT DETECTED Final   Coronavirus OC43 NOT DETECTED NOT DETECTED Final   Metapneumovirus NOT DETECTED NOT DETECTED Final   Rhinovirus / Enterovirus DETECTED (A) NOT DETECTED Final   Influenza A NOT DETECTED NOT DETECTED Final   Influenza B NOT DETECTED NOT DETECTED Final   Parainfluenza Virus 1 NOT DETECTED NOT DETECTED Final   Parainfluenza Virus 2 NOT DETECTED NOT DETECTED Final   Parainfluenza Virus 3 NOT DETECTED NOT DETECTED Final   Parainfluenza Virus 4 NOT DETECTED NOT DETECTED Final   Respiratory Syncytial Virus NOT DETECTED NOT DETECTED Final   Bordetella pertussis NOT DETECTED NOT DETECTED Final   Chlamydophila pneumoniae NOT DETECTED NOT DETECTED Final   Mycoplasma pneumoniae NOT DETECTED NOT DETECTED Final    Comment: Performed at [facility], [place] [address] [place], [place] [address]    Labs: BNP (last 3 results)  Recent Labs  08/09/17 0448  BNP 956.3*   Basic Metabolic  Panel:  Recent Labs Lab 08/11/17 0443 08/12/17 0353 08/13/17 0324 08/14/17 0329 08/15/17 0250  NA 135 136 136 134* 137  K 3.9 4.1 3.6 4.3 3.7  CL 98* 100* 100* 100* 102  CO2 _0 GLUCOSE 107* 149* 95 150* 120*  BUN 23* 20 23* 27* 29*  CREATININE 1.28* 1.13 1.31* 1.34* 1.29*  CALCIUM 8.3* 8.4* 8.3* 8.2* 8.4*  MG  --  2.3 2.2 2.3 2.6*  PHOS  --   --  4.0 3.9  --    Liver Function Tests:  Recent Labs Lab 08/09/17 0246 08/10/17 0411 08/11/17 0443 08/13/17 0324 08/14/17 0329  AST 95* 207* 139* 52* 35  ALT 22 43 39 33 34  ALKPHOS 47 37* 34* 36* 38  BILITOT 0.6 1.2 1.4* 1.2 0.9  PROT 7.7 6.6 6.7 6.8 6.7  ALBUMIN 3.9 3.4* 3.4* 3.3* 3.3*    Recent Labs Lab 08/09/17 0720  LIPASE 35    Recent Labs Lab 08/11/17 0443  AMMONIA 38*   CBC:  Recent Labs Lab 08/09/17 0246 08/10/17 0411 08/11/17 0443 08/12/17 0353 08/13/17 0324 08/14/17 0329  WBC 18.1* 21.1* 15.3* 12.8* 12.9* 11.2*  NEUTROABS 13.7*  --   --  11.3* 9.2* 10.2*  HGB 15.1 13.0  11.7* 11.8* 11.5* 11.4*  HCT 44.5 39.3 35.4* 35.5* 34.2* 34.0*  MCV 97.4 98.0 97.3 96.5 97.2 96.3  PLT 213 193 167 171 176 177   Cardiac Enzymes:  Recent Labs Lab 08/09/17 0426 08/09/17 0720 08/09/17 1025  TROPONINI 6.18* 15.25* 27.86*   BNP: Invalid input(s): POCBNP CBG: No results for input(s): GLUCAP in the last 168 hours. D-Dimer No results for input(s): DDIMER in the last 72 hours. Hgb A1c No results for input(s): HGBA1C in the last 72 hours. Lipid Profile No results for input(s): CHOL, HDL, LDLCALC, TRIG, CHOLHDL, LDLDIRECT in the last 72 hours. Thyroid function studies No results for input(s): TSH, T4TOTAL, T3FREE, THYROIDAB in the last 72 hours.  Invalid input(s): FREET3 Anemia work up No results for input(s): VITAMINB12, FOLATE, FERRITIN, TIBC, IRON, RETICCTPCT in the last 72 hours. Urinalysis    Component Value Date/Time   COLORURINE YELLOW 08/09/2017 0700   APPEARANCEUR CLEAR  08/09/2017 0700   LABSPEC 1.013 08/09/2017 0700   PHURINE 5.0 08/09/2017 0700   GLUCOSEU NEGATIVE 08/09/2017 0700   HGBUR SMALL (A) 08/09/2017 0700   BILIRUBINUR NEGATIVE 08/09/2017 0700   KETONESUR NEGATIVE 08/09/2017 0700   PROTEINUR NEGATIVE 08/09/2017 0700   NITRITE NEGATIVE 08/09/2017 0700   LEUKOCYTESUR NEGATIVE 08/09/2017 0700   Sepsis Labs Invalid input(s): PROCALCITONIN,  WBC,  LACTICIDVEN Microbiology Recent Results (from the past 240 hour(s))  Blood Culture (routine x 2)     Status: None   Collection Time: 08/09/17  2:34 AM  Result Value Ref Range Status   Specimen Description RIGHT ANTECUBITAL  Final   Special Requests   Final    BOTTLES DRAWN AEROBIC AND ANAEROBIC Blood Culture adequate volume   Culture   Final    NO GROWTH 5 DAYS Performed at [facility], [address], [place], [place] [address]    Report Status 08/14/2017 FINAL  Final  Blood Culture (routine x 2)     Status: None   Collection Time: 08/09/17  3:10 AM  Result Value Ref Range Status   Specimen Description BLOOD RIGHT FOREARM  Final   Special Requests BOTTLES DRAWN AEROBIC AND ANAEROBIC BCAV  Final   Culture   Final    NO GROWTH 5 DAYS Performed at [facility], [place] [address] [place], [place] [address]    Report Status 08/14/2017 FINAL  Final  MRSA PCR Screening     Status: None   Collection Time: 08/09/17  6:39 AM  Result Value Ref Range Status   MRSA by PCR NEGATIVE NEGATIVE Final    Comment:        The GeneXpert MRSA Assay (FDA approved for NASAL specimens only), is one component of a comprehensive MRSA colonization surveillance program. It is not intended to diagnose MRSA infection nor to guide or monitor treatment for MRSA infections.   Urine culture     Status: None   Collection Time: 08/09/17  7:00 AM  Result Value Ref Range Status   Specimen Description URINE, CLEAN CATCH  Final   Special Requests NONE  Final   Culture   Final    NO GROWTH Performed at  [facility], [address] [place], [place] [address]    Report Status 08/10/2017 FINAL  Final  Respiratory Panel by PCR     Status: Abnormal   Collection Time: 08/09/17  7:00 AM  Result Value Ref Range Status   Adenovirus NOT DETECTED NOT DETECTED Final   Coronavirus 229E NOT DETECTED NOT  DETECTED Final   Coronavirus HKU1 NOT DETECTED NOT DETECTED Final   Coronavirus NL63 NOT DETECTED NOT DETECTED Final   Coronavirus OC43 NOT DETECTED NOT DETECTED Final   Metapneumovirus NOT DETECTED NOT DETECTED Final   Rhinovirus / Enterovirus DETECTED (A) NOT DETECTED Final   Influenza A NOT DETECTED NOT DETECTED Final   Influenza B NOT DETECTED NOT DETECTED Final   Parainfluenza Virus 1 NOT DETECTED NOT DETECTED Final   Parainfluenza Virus 2 NOT DETECTED NOT DETECTED Final   Parainfluenza Virus 3 NOT DETECTED NOT DETECTED Final   Parainfluenza Virus 4 NOT DETECTED NOT DETECTED Final   Respiratory Syncytial Virus NOT DETECTED NOT DETECTED Final   Bordetella pertussis NOT DETECTED NOT DETECTED Final   Chlamydophila pneumoniae NOT DETECTED NOT DETECTED Final   Mycoplasma pneumoniae NOT DETECTED NOT DETECTED Final    Comment: Performed at [facility], [place] [address] [place] [place], [place] [address]   Time coordinating discharge: 35 minutes  SIGNED:  Kerney Elbe, DO [facility] 08/15/2017, 9:27 AM Pager [phone]  If 7PM-7AM, please contact night-coverage [email or web address] Password [email or web address]

## 2017-08-15 NOTE — Progress Notes (Signed)
Patient discharged with [facility] to [facility].

## 2017-08-15 NOTE — Progress Notes (Signed)
Report called to Paediatric nurseTasha RN at [facility]. Patient will be transported by PTAR at approximately 2 pm. All personal items accounted for.

## 2017-08-15 NOTE — Progress Notes (Signed)
Patient woke up from bed very confused and constantly attempting to get of bed. Pt was placed in the chair but he still insisting on being out of the chair. Md paged and order received for haldol. Patient is a fall risk. Will continue to monitor

## 2017-09-06 ENCOUNTER — Other Ambulatory Visit: Payer: Self-pay

## 2017-09-06 NOTE — Patient Outreach (Signed)
[facility] Care Management  09/06/2017  Jacob Barajas [birth year] [id number]     Transition of Care Referral  Referral Date: 09/06/17 Referral Source: [facility] Dept Date of Admission: 08/15/17 Diagnosis: Community acquired PNA, sepsis, elevated troponin Date of Discharge: 09/03/17 [facility] Insurance: [facility]    Outreach attempt # 1 to patient. A male answered and reported patient was not available at this time.    Plan: RN CM will make outreach attempt to patient within one business day.    Antionette Fairyoshanda Eleonora Peeler, RN,BSN,CCM [facility] Care [facility] Telephonic Care Management Coordinator Direct Phone: [phone] Toll Free: [phone] Fax: [phone]

## 2017-09-07 ENCOUNTER — Other Ambulatory Visit: Payer: Self-pay

## 2017-09-07 NOTE — Patient Outreach (Signed)
[facility] Care Management  09/07/2017  Jacob LollJohn Barajas Oct 26, 1940 [id number]    Transition of Care Referral  Referral Date: 09/06/17 Referral Source: [facility] Date of Admission: 08/15/17 Diagnosis: Community acquired PNA, sepsis, elevated troponin Date of Discharge: 09/03/17 [facility] Insurance: [facility]   Outreach attempt #2 to patient. A male answered and  reported patient was "not in." He immediately hung up the phone.       Plan: RN CM will make outreach attempt within one business day.    Antionette Fairyoshanda Jemina Scahill, RN,BSN,CCM [facility] Care [facility] Telephonic Care Management Coordinator Direct Phone: [phone] Toll Free: [phone] Fax: [phone]

## 2017-09-08 ENCOUNTER — Other Ambulatory Visit: Payer: Self-pay

## 2017-09-08 NOTE — Patient Outreach (Signed)
[facility] Care Management    Milagros LollJohn Garduno 1940/11/11 [id number]   Transition of Care Referral  Referral Date:09/06/17 Referral Source:[facility] Dept Date of Admission:08/15/17 Diagnosis:Community acquired PNA, sepsis, elevated troponin Date of Discharge:09/03/17 Facility:[facility] Insurance:[facility]   Outreach attempt #3 to patient. Spoke with male who first reported patient was not available. RN CM asked to speak with sons(Damere or Mellody DanceKeith as ROI on file). Male reported he was patient's son but would not disclose his name and then he preceded to ask what the purpose of the call was. RN CM did not disclose any PHI but started to explain the nature of the call. Son then interrupted and said that patient "passed away last night." RN CM offered condolences and call ended.    Plan: RN CM will notify [facility] of case status. No PCP on file to send MD case close letter.  Antionette Fairyoshanda Dariann Huckaba, RN,BSN,CCM [facility] Care Management Telephonic Care Management Coordinator Direct Phone: [phone] Toll Free: [phone] Fax: [phone]

## 2017-09-18 DIAGNOSIS — 419620001 Death: Secondary | SNOMED CT | POA: Diagnosis not present

## 2017-09-18 DEATH — deceased

## 2018-02-22 DIAGNOSIS — I2510 Atherosclerotic heart disease of native coronary artery without angina pectoris: Secondary | ICD-10-CM

## 2018-02-22 DIAGNOSIS — Z7189 Other specified counseling: Secondary | ICD-10-CM

## 2018-02-22 DIAGNOSIS — Z515 Encounter for palliative care: Secondary | ICD-10-CM

## 2018-02-22 DIAGNOSIS — Z66 Do not resuscitate: Secondary | ICD-10-CM

## 2018-05-16 IMAGING — CT CT ABD-PELV W/O CM
2 of 4 series · 16 of 46 positions shown, 18 images · non-contrast
Comparison: None.

CLINICAL DATA: 76 y/o M; nausea, vomiting, and abdominal
distention.

EXAM:
CT ABDOMEN AND PELVIS WITHOUT CONTRAST
TECHNIQUE: Multidetector CT imaging of the abdomen and pelvis was performed
following the standard protocol without IV contrast.

[Series 3: a/p w/o 5mm · axial · non-contrast · 0.82mm/px · z∈[+664,+1134]mm · 13 of 106 slices shown, 15 images]
[im 6/106  soft-tissue]
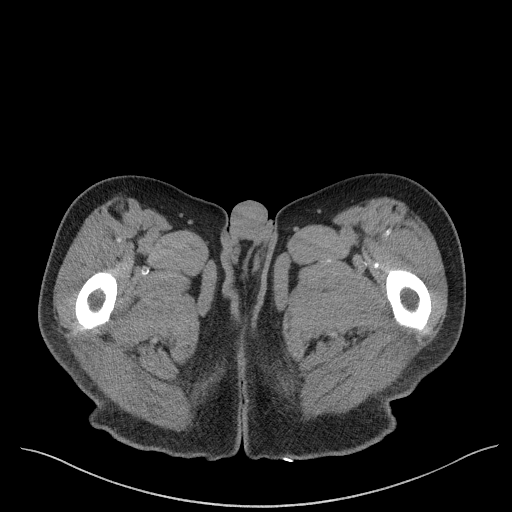
[im 6/106  bone]
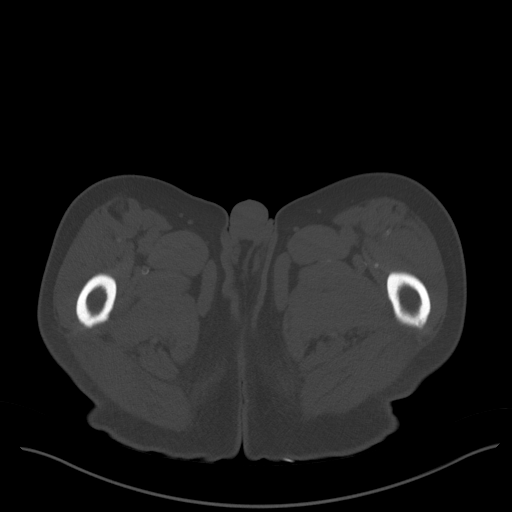
[im 17/106  soft-tissue]
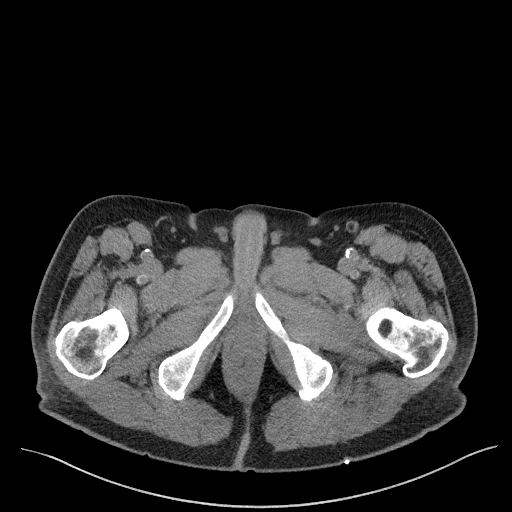
[im 23/106  soft-tissue]
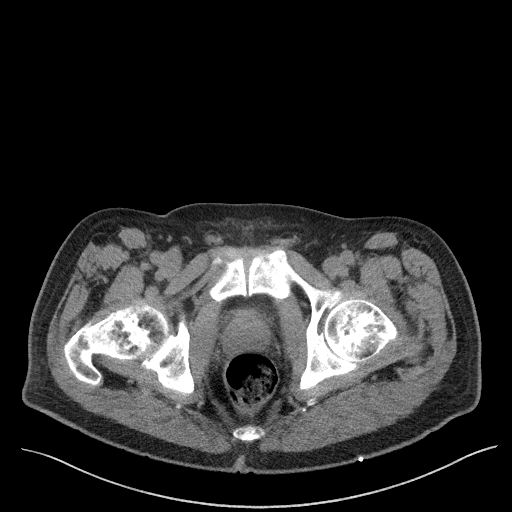
[im 28/106  soft-tissue]
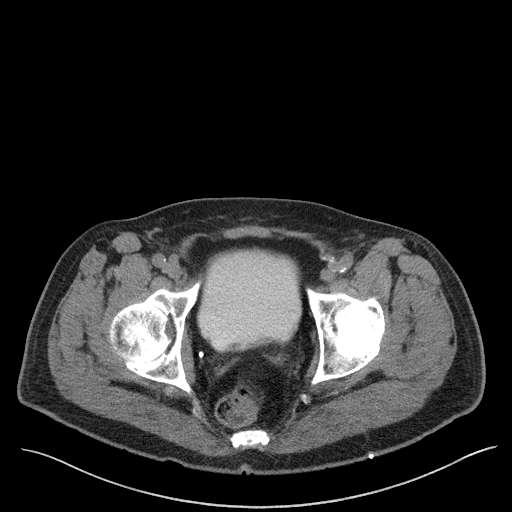
[im 39/106  soft-tissue]
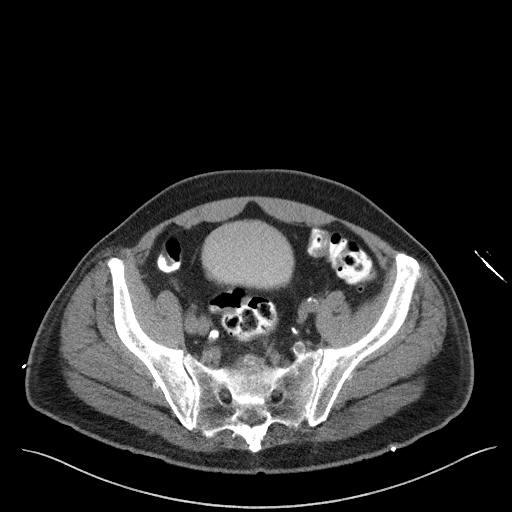
[im 45/106  soft-tissue]
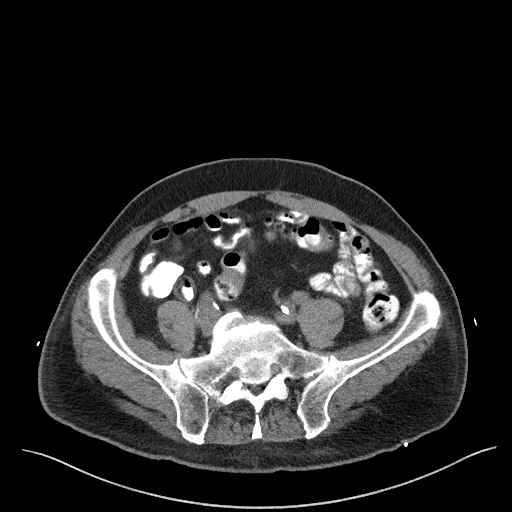
[im 56/106  soft-tissue]
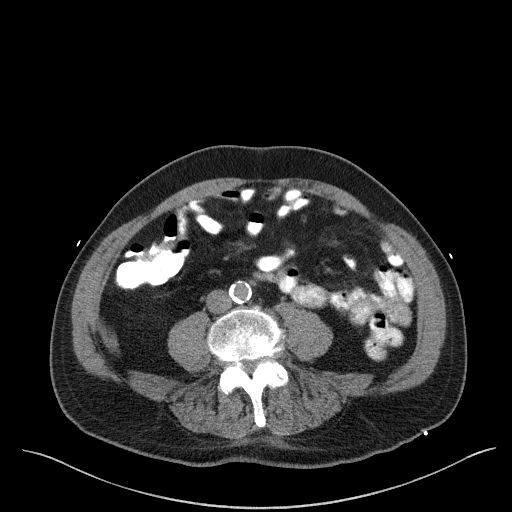
[im 61/106  soft-tissue]
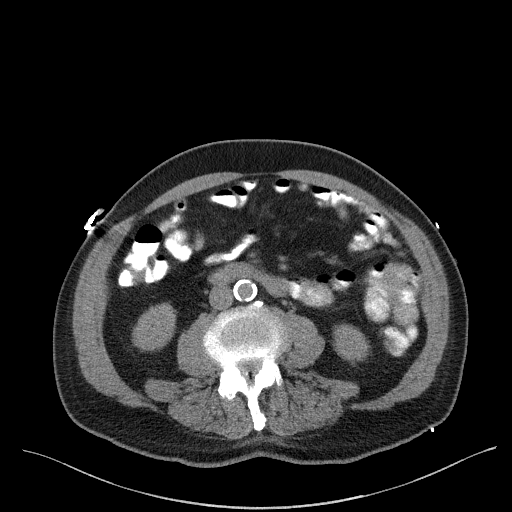
[im 67/106  soft-tissue]
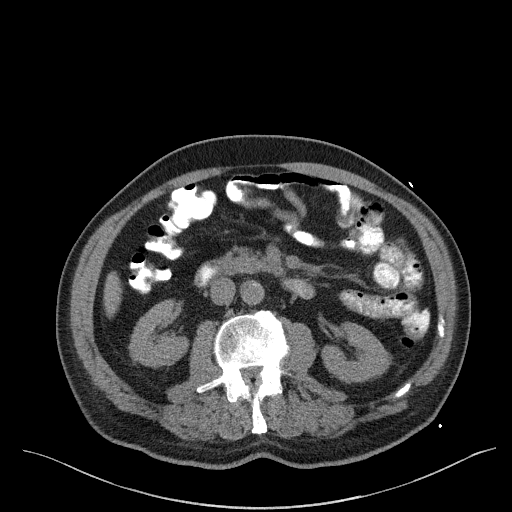
[im 67/106  bone]
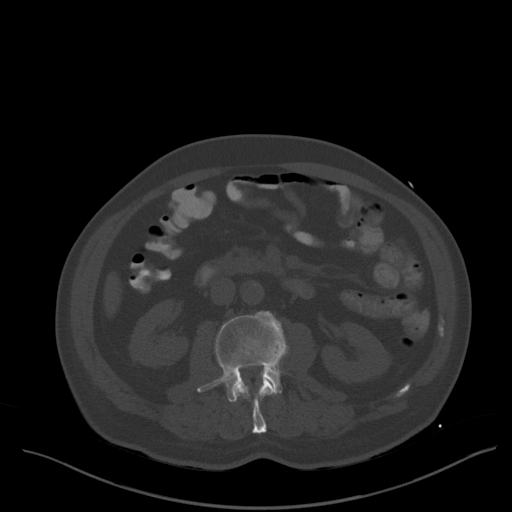
[im 78/106  soft-tissue]
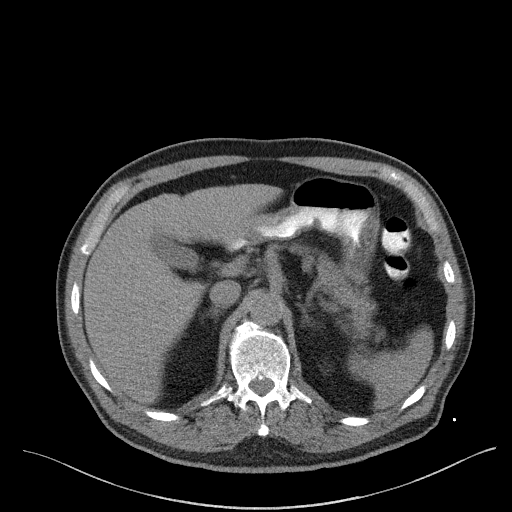
[im 83/106  soft-tissue]
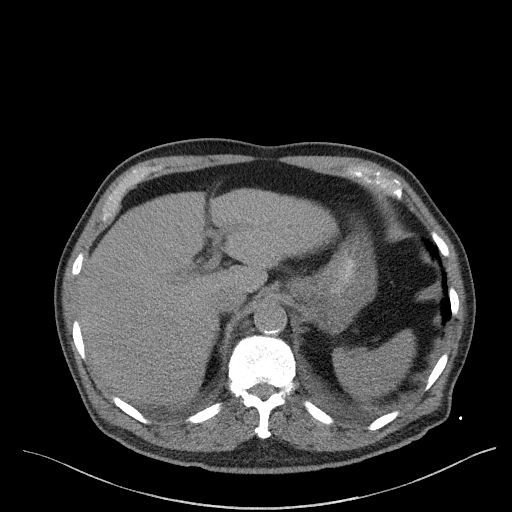
[im 89/106  soft-tissue]
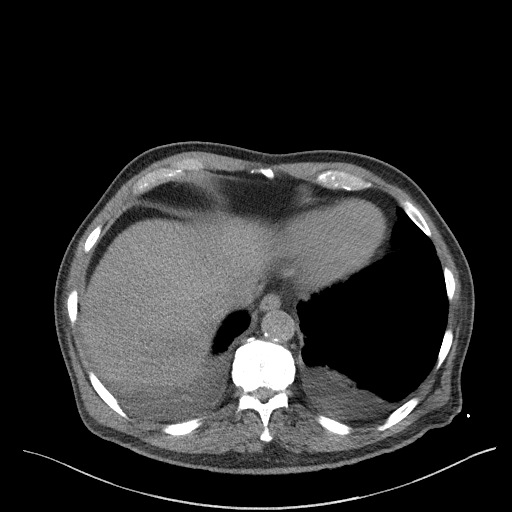
[im 100/106  soft-tissue]
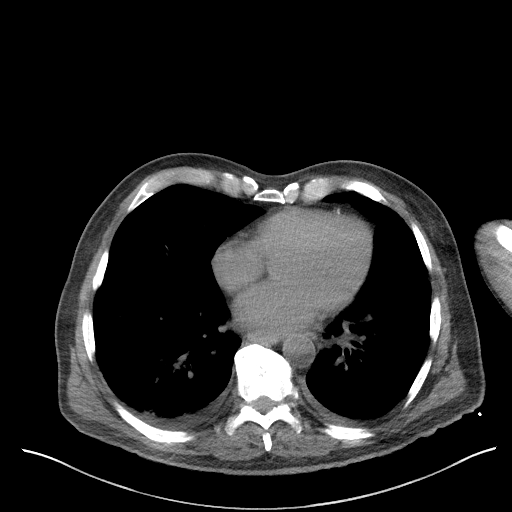

[Series 6: a/p w/o cor · coronal · non-contrast · 0.74mm/px · 3 of 130 slices shown]
[im 44/130  soft-tissue]
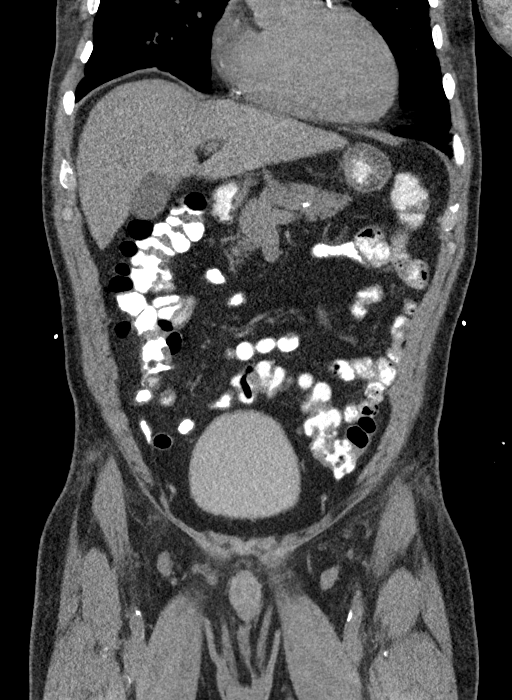
[im 58/130  soft-tissue]
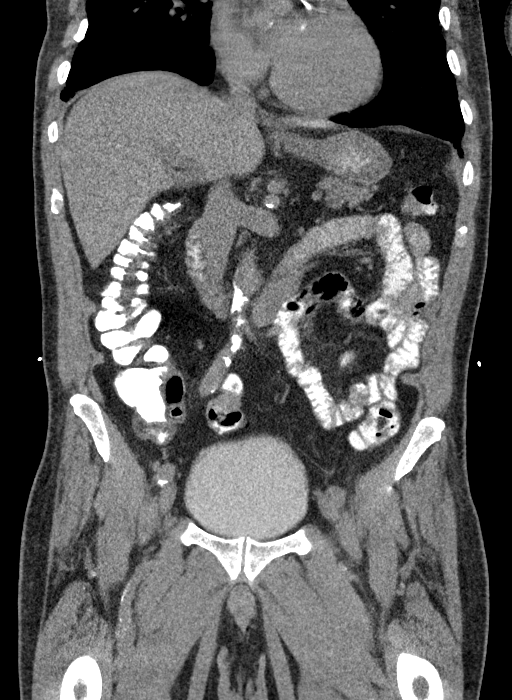
[im 72/130  soft-tissue]
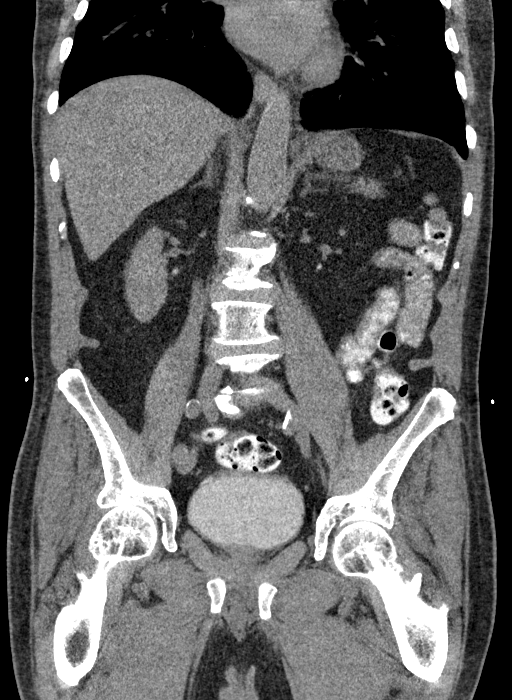

[16 of 46 positions shown; findings below may reference images not displayed]

FINDINGS: Lower chest: Small bilateral pleural effusions. Severe coronary
artery calcification. Mild diffuse peribronchial thickening.

Hepatobiliary: Nonspecific punctate calcification in right lobe of
liver likely representing sequelae of granulomatous disease. No
other focal liver abnormality is seen. No gallstones, gallbladder
wall thickening, or biliary dilatation.

Pancreas: Unremarkable. No pancreatic ductal dilatation or
surrounding inflammatory changes.

Spleen: Normal in size without focal abnormality.

Adrenals/Urinary Tract: Adrenal glands are unremarkable. Kidneys are
normal, without renal calculi, focal lesion, or hydronephrosis.
Bladder is unremarkable.

Stomach/Bowel: Stomach is within normal limits. Appendix not
identified, no pericecal inflammatory changes. No evidence of bowel
wall thickening, distention, or inflammatory changes. No acute
process identified.

Vascular/Lymphatic: Aortic atherosclerosis. No enlarged abdominal or
pelvic lymph nodes.

Reproductive: Prostate is unremarkable.

Other: No abdominal wall hernia or abnormality. No abdominopelvic
ascites.

Musculoskeletal: Advanced multilevel degenerative changes of the
lumbar spine with multiple levels of canal stenosis greatest at the
L4-5 level where it is moderate to severe. No acute fracture
identified.
IMPRESSION: 1. No acute abdominal or pelvic process identified.
2. Small bilateral pleural effusions. Bronchitic changes in the lung
bases.
3. Severe coronary artery and aortic calcific atherosclerosis.
4. Advanced lumbar degenerative changes with multilevel canal
stenosis.

By: Shaleen Deanda M.D.
